# Patient Record
Sex: Female | Born: 1970 | Race: White | Hispanic: No | Marital: Married | Smoking: Never smoker
Health system: Southern US, Community
[De-identification: ages and names within clinical notes are randomized; demographics above are authoritative.]

## PROBLEM LIST (undated history)

## (undated) DIAGNOSIS — R000 Tachycardia, unspecified: Secondary | ICD-10-CM

## (undated) DIAGNOSIS — O24419 Gestational diabetes mellitus in pregnancy, unspecified control: Secondary | ICD-10-CM

## (undated) DIAGNOSIS — F32A Depression, unspecified: Secondary | ICD-10-CM

## (undated) DIAGNOSIS — I4711 Inappropriate sinus tachycardia, so stated: Secondary | ICD-10-CM

## (undated) DIAGNOSIS — I10 Essential (primary) hypertension: Secondary | ICD-10-CM

## (undated) DIAGNOSIS — B029 Zoster without complications: Secondary | ICD-10-CM

## (undated) DIAGNOSIS — K219 Gastro-esophageal reflux disease without esophagitis: Secondary | ICD-10-CM

## (undated) DIAGNOSIS — D649 Anemia, unspecified: Secondary | ICD-10-CM

## (undated) DIAGNOSIS — C44519 Basal cell carcinoma of skin of other part of trunk: Secondary | ICD-10-CM

## (undated) DIAGNOSIS — N390 Urinary tract infection, site not specified: Secondary | ICD-10-CM

## (undated) DIAGNOSIS — Z8619 Personal history of other infectious and parasitic diseases: Secondary | ICD-10-CM

## (undated) DIAGNOSIS — J310 Chronic rhinitis: Secondary | ICD-10-CM

## (undated) DIAGNOSIS — R51 Headache: Secondary | ICD-10-CM

## (undated) DIAGNOSIS — T4145XA Adverse effect of unspecified anesthetic, initial encounter: Secondary | ICD-10-CM

## (undated) DIAGNOSIS — K648 Other hemorrhoids: Secondary | ICD-10-CM

## (undated) DIAGNOSIS — I319 Disease of pericardium, unspecified: Secondary | ICD-10-CM

## (undated) DIAGNOSIS — T8859XA Other complications of anesthesia, initial encounter: Secondary | ICD-10-CM

## (undated) DIAGNOSIS — Z803 Family history of malignant neoplasm of breast: Secondary | ICD-10-CM

## (undated) DIAGNOSIS — Z800 Family history of malignant neoplasm of digestive organs: Secondary | ICD-10-CM

## (undated) DIAGNOSIS — G43909 Migraine, unspecified, not intractable, without status migrainosus: Secondary | ICD-10-CM

## (undated) DIAGNOSIS — F329 Major depressive disorder, single episode, unspecified: Secondary | ICD-10-CM

## (undated) DIAGNOSIS — R112 Nausea with vomiting, unspecified: Secondary | ICD-10-CM

## (undated) DIAGNOSIS — Z9889 Other specified postprocedural states: Secondary | ICD-10-CM

## (undated) DIAGNOSIS — N6009 Solitary cyst of unspecified breast: Secondary | ICD-10-CM

## (undated) HISTORY — DX: Zoster without complications: B02.9

## (undated) HISTORY — DX: Tachycardia, unspecified: R00.0

## (undated) HISTORY — PX: COLONOSCOPY: SHX174

## (undated) HISTORY — DX: Urinary tract infection, site not specified: N39.0

## (undated) HISTORY — DX: Headache: R51

## (undated) HISTORY — DX: Family history of malignant neoplasm of breast: Z80.3

## (undated) HISTORY — DX: Chronic rhinitis: J31.0

## (undated) HISTORY — DX: Migraine, unspecified, not intractable, without status migrainosus: G43.909

## (undated) HISTORY — DX: Family history of malignant neoplasm of digestive organs: Z80.0

## (undated) HISTORY — DX: Gastro-esophageal reflux disease without esophagitis: K21.9

## (undated) HISTORY — DX: Inappropriate sinus tachycardia, so stated: I47.11

## (undated) HISTORY — DX: Gestational diabetes mellitus in pregnancy, unspecified control: O24.419

## (undated) HISTORY — DX: Solitary cyst of unspecified breast: N60.09

## (undated) HISTORY — DX: Major depressive disorder, single episode, unspecified: F32.9

## (undated) HISTORY — DX: Anemia, unspecified: D64.9

## (undated) HISTORY — DX: Depression, unspecified: F32.A

## (undated) HISTORY — DX: Other hemorrhoids: K64.8

## (undated) HISTORY — DX: Personal history of other infectious and parasitic diseases: Z86.19

## (undated) HISTORY — DX: Disease of pericardium, unspecified: I31.9

## (undated) HISTORY — PX: NASAL SINUS SURGERY: SHX719

## (undated) HISTORY — DX: Basal cell carcinoma of skin of other part of trunk: C44.519

---
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
## 2004-10-18 ENCOUNTER — Ambulatory Visit: Payer: Self-pay

## 2004-11-21 DIAGNOSIS — K648 Other hemorrhoids: Secondary | ICD-10-CM

## 2004-11-21 HISTORY — DX: Other hemorrhoids: K64.8

## 2004-11-21 LAB — HM COLONOSCOPY

## 2004-12-24 ENCOUNTER — Ambulatory Visit: Payer: Self-pay | Admitting: Unknown Physician Specialty

## 2008-11-21 HISTORY — PX: CHOLECYSTECTOMY: SHX55

## 2009-03-05 ENCOUNTER — Ambulatory Visit (HOSPITAL_COMMUNITY): Admission: RE | Admit: 2009-03-05 | Discharge: 2009-03-05 | Payer: Self-pay | Admitting: Gastroenterology

## 2009-04-07 ENCOUNTER — Ambulatory Visit (HOSPITAL_COMMUNITY): Admission: RE | Admit: 2009-04-07 | Discharge: 2009-04-07 | Payer: Self-pay | Admitting: General Surgery

## 2009-04-07 ENCOUNTER — Encounter (INDEPENDENT_AMBULATORY_CARE_PROVIDER_SITE_OTHER): Payer: Self-pay | Admitting: General Surgery

## 2009-06-08 ENCOUNTER — Encounter: Admission: RE | Admit: 2009-06-08 | Discharge: 2009-06-08 | Payer: Self-pay | Admitting: Orthopedic Surgery

## 2009-06-25 ENCOUNTER — Encounter: Admission: RE | Admit: 2009-06-25 | Discharge: 2009-06-25 | Payer: Self-pay | Admitting: Orthopedic Surgery

## 2009-07-08 ENCOUNTER — Encounter: Admission: RE | Admit: 2009-07-08 | Discharge: 2009-07-08 | Payer: Self-pay | Admitting: Orthopedic Surgery

## 2009-11-21 DIAGNOSIS — N6009 Solitary cyst of unspecified breast: Secondary | ICD-10-CM

## 2009-11-21 HISTORY — PX: BASAL CELL CARCINOMA EXCISION: SHX1214

## 2009-11-21 HISTORY — DX: Solitary cyst of unspecified breast: N60.09

## 2010-12-12 ENCOUNTER — Encounter: Payer: Self-pay | Admitting: Gastroenterology

## 2011-03-01 LAB — HEMOGLOBIN AND HEMATOCRIT, BLOOD
HCT: 38.2 % (ref 36.0–46.0)
Hemoglobin: 12.8 g/dL (ref 12.0–15.0)

## 2011-03-01 LAB — PREGNANCY, URINE: Preg Test, Ur: NEGATIVE

## 2011-04-05 NOTE — Op Note (Signed)
Hailey Wilkins, Hailey Wilkins                ACCOUNT NO.:  [id number]   MEDICAL RECORD NO.:  [id number]          PATIENT TYPE:  AMB   LOCATION:  DAY                          FACILITY:  [facility]   PHYSICIAN:  Almond Lint, MD       DATE OF BIRTH:  02/28/1971   DATE OF PROCEDURE:  04/07/2009  DATE OF DISCHARGE:                               OPERATIVE REPORT   PREOPERATIVE DIAGNOSIS:  Biliary dyskinesia.   POSTOPERATIVE DIAGNOSIS:  Biliary dyskinesia.   PROCEDURE PERFORMED:  Laparoscopic cholecystectomy.   SURGEON:  Almond Lint, MD.   ASSISTANTLoreta Ave, RNFA.   ANESTHESIA:  General and local.   FINDINGS:  Small contracted gallbladder.   SPECIMEN:  Gallbladder to pathology.   ESTIMATED BLOOD LOSS:  Minimal.   COMPLICATIONS:  None known.   PROCEDURE:  Ms. Epler was identified in the holding area and taken to  the operating room where she was placed supine on the operating room  table.  General endotracheal anesthesia was induced.  The patient's  abdomen was prepped and draped in a sterile fashion.  A timeout was  performed according to the surgical safety check list.  When all was  correct, we continued.   The infraumbilical skin was anesthetized with a mixture of 1% lidocaine  plain and quarter-percent Marcaine with epinephrine.  A curvilinear  transverse incision was made under the umbilicus.  This was  approximately 1.5 cm in length.  The subcutaneous tissues were spread  with the Kelly clamp and the umbilical stock was elevated with a Market researcher.  A #11 blade was used to incise the midline fascia.  A Kelly clamp was  used to confirm entrance into the peritoneal cavity.  The Kochers were  manipulated to either side of the fascial incision.  A pursestring  suture was placed around the fascial incision with a 0 Vicryl.  The  [facility] trocar was introduced into the abdomen and pneumoperitoneum was  achieved to a pressure of 15 mmHg.  An #11 port was introduced into the  abdomen in the  epigastrium after administration of local anesthesia.  This was done under direct visualization and no injuries were seen.  The  two 5 mm ports were placed in the right upper quadrant after  administration of local under direct visualization.  A locking grasper  was placed on the fundus of the gallbladder and elevated toward the  head.  The infundibulum was retracted laterally.  A [place] dissector  was used to skeletonize the cystic artery and the cystic duct.  The  peritoneum was opened with the hook cautery in order to visualize the  edge of the liver.  A critical view was obtained from the medial and  lateral, ensuring that there were only two tubular structures directly  entering the gallbladder.  These were clipped.  The cystic duct was  clipped three times on the patient's side and once on the specimen side.  The cystic artery was clipped twice on the patient's side and once on  the specimen side.  These were transected.  Hook cautery was then used  to take the gallbladder off of the gallbladder fossa.  The camera was  then moved to the epigastric port and an EndoCatch bag was used to place  the gallbladder into the bag.  The gallbladder was removed through the  umbilical incision and the [facility] trocar was replaced.  The gallbladder  fossa was reexamined.  At one point, the gallbladder had been entered  with the cautery and there was a small amount of bile spillage.  There  were no stones visible.  The gallbladder fossa and the suprahepatic  region were irrigated copiously.  Once the irrigant returned clear and  the gallbladder fossa was reexamined, there was no evidence of bleeding.  The Roseanne Reno was then removed from the umbilicus and the pursestring  suture closed.  The fascial incision was palpated and there was no signs  of defect.  The skin was then closed using 4-0 Monocryl in a  subcuticular fashion.  The patient was awakened from anesthesia and  taken to the PACU in stable  condition.      Almond Lint, MD  Electronically Signed     FB/MEDQ  D:  04/07/2009  T:  04/07/2009  Job:  [id number]

## 2011-04-22 DIAGNOSIS — B029 Zoster without complications: Secondary | ICD-10-CM

## 2011-04-22 HISTORY — DX: Zoster without complications: B02.9

## 2011-11-21 LAB — HM PAP SMEAR

## 2011-12-23 LAB — HM MAMMOGRAPHY

## 2012-02-13 ENCOUNTER — Ambulatory Visit (INDEPENDENT_AMBULATORY_CARE_PROVIDER_SITE_OTHER): Payer: PRIVATE HEALTH INSURANCE | Admitting: Internal Medicine

## 2012-02-13 ENCOUNTER — Encounter: Payer: Self-pay | Admitting: Internal Medicine

## 2012-02-13 VITALS — BP 106/70 | HR 95 | Temp 98.8°F | Ht 62.0 in | Wt 179.0 lb

## 2012-02-13 DIAGNOSIS — J329 Chronic sinusitis, unspecified: Secondary | ICD-10-CM

## 2012-02-13 DIAGNOSIS — R400 Somnolence: Secondary | ICD-10-CM | POA: Insufficient documentation

## 2012-02-13 DIAGNOSIS — Z889 Allergy status to unspecified drugs, medicaments and biological substances status: Secondary | ICD-10-CM | POA: Insufficient documentation

## 2012-02-13 DIAGNOSIS — J45909 Unspecified asthma, uncomplicated: Secondary | ICD-10-CM

## 2012-02-13 DIAGNOSIS — Z888 Allergy status to other drugs, medicaments and biological substances status: Secondary | ICD-10-CM

## 2012-02-13 DIAGNOSIS — G4710 Hypersomnia, unspecified: Secondary | ICD-10-CM

## 2012-02-13 MED ORDER — PREDNISONE (PAK) 10 MG PO TABS: ORAL_TABLET | ORAL | Status: AC

## 2012-02-13 NOTE — Progress Notes (Signed)
Subjective:    Patient ID: Hailey Wilkins, female    DOB: May 21, 1971, 41 y.o.   MRN: [id number]  HPI  41YO female with h/o allergic rhinitis and asthma presents to establish care. She has several concerns today. First, she notes recent daytime somnolence. She has attributed this to use of celexa. She notes that she gets adequate sleep at night, but does snore. She has never been evaluated for sleep apnea.  She is also concerned about several weeks of nasal congestion.  She notes purulent nasal drainage which runs down the back of her throat. She notes bilateral forehead pain and ear pain.  She has been taking sudafed with no improvement.  She denies fever or chills. She denies cough or dyspnea.    Outpatient Encounter Prescriptions as of 02/13/2012  Medication Sig Dispense Refill  . albuterol (VENTOLIN HFA) 108 (90 BASE) MCG/ACT inhaler Inhale 2 puffs into the lungs every 6 (six) hours as needed.      Marland Kitchen aspirin-acetaminophen-caffeine (EXCEDRIN MIGRAINE) 250-250-65 MG per tablet Take 1 tablet by mouth every 6 (six) hours as needed.      Marland Kitchen b complex vitamins tablet Take 1 tablet by mouth daily.      . cetirizine (ZYRTEC) 10 MG tablet Take 10 mg by mouth daily.      . Cholecalciferol (VITAMIN D) 2000 UNITS tablet Take 2,000 Units by mouth daily.      . citalopram (CELEXA) 20 MG tablet Take 20 mg by mouth daily.      . valACYclovir (VALTREX) 1000 MG tablet Take 1,000 mg by mouth daily as needed. For fever blister      . predniSONE (STERAPRED UNI-PAK) 10 MG tablet Take 60mg  day 1 then taper by 10mg  daily  21 tablet  0    Review of Systems  Constitutional: Negative for fever, chills, appetite change, fatigue and unexpected weight change.  HENT: Positive for congestion, postnasal drip and sinus pressure. Negative for ear pain, sore throat, trouble swallowing, neck pain and voice change.   Eyes: Negative for visual disturbance.  Respiratory: Negative for cough, shortness of breath, wheezing and  stridor.   Cardiovascular: Negative for chest pain, palpitations and leg swelling.  Gastrointestinal: Negative for nausea, vomiting, abdominal pain, diarrhea, constipation, blood in stool, abdominal distention and anal bleeding.  Genitourinary: Negative for dysuria and flank pain.  Musculoskeletal: Negative for myalgias, arthralgias and gait problem.  Skin: Negative for color change and rash.  Neurological: Positive for headaches. Negative for dizziness.  Hematological: Negative for adenopathy. Does not bruise/bleed easily.  Psychiatric/Behavioral: Negative for suicidal ideas, sleep disturbance and dysphoric mood. The patient is not nervous/anxious.    BP 106/70  Pulse 95  Temp(Src) 98.8 F (37.1 C) (Oral)  Ht 5\' 2"  (1.575 m)  Wt 179 lb (81.194 kg)  BMI 32.74 kg/m2  SpO2 99%     Objective:   Physical Exam  Constitutional: She is oriented to person, place, and time. She appears well-developed and well-nourished. No distress.  HENT:  Head: Normocephalic and atraumatic.  Right Ear: External ear normal.  Left Ear: External ear normal.  Nose: Mucosal edema present. Right sinus exhibits frontal sinus tenderness. Left sinus exhibits frontal sinus tenderness.  Mouth/Throat: Oropharynx is clear and moist. No oropharyngeal exudate.  Eyes: Conjunctivae are normal. Pupils are equal, round, and reactive to light. Right eye exhibits no discharge. Left eye exhibits no discharge. No scleral icterus.  Neck: Normal range of motion. Neck supple. No tracheal deviation present. No thyromegaly present.  Cardiovascular: Normal rate, regular rhythm, normal heart sounds and intact distal pulses.  Exam reveals no gallop and no friction rub.   No murmur heard. Pulmonary/Chest: Effort normal and breath sounds normal. No respiratory distress. She has no wheezes. She has no rales. She exhibits no tenderness.  Abdominal: Soft. Bowel sounds are normal. She exhibits no distension. There is no tenderness.    Musculoskeletal: Normal range of motion. She exhibits no edema and no tenderness.  Lymphadenopathy:    She has no cervical adenopathy.  Neurological: She is alert and oriented to person, place, and time. No cranial nerve deficit. She exhibits normal muscle tone. Coordination normal.  Skin: Skin is warm and dry. No rash noted. She is not diaphoretic. No erythema. No pallor.  Psychiatric: She has a normal mood and affect. Her behavior is normal. Judgment and thought content normal.          Assessment & Plan:

## 2012-02-13 NOTE — Assessment & Plan Note (Signed)
Symptoms consistent with frontal sinusitis. However, pt has numerous antibiotic allergies.  Would likely need IV antibiotics based on current allergy list.  Will treat with course of prednisone to see if any improvement in inflammation/pain.  Will set up with [facility] for further evaluation. Question if she would benefit from direct visualization of sinuses and specific allergy testing to help determine if she has true allergy to classes of antibiotics including PCN, fluoroquinolone, and cephalosporins.  Follow up 2 months.

## 2012-02-13 NOTE — Assessment & Plan Note (Signed)
Symptoms suggestive of sleep apnea. Will get sleep study for further evaluation.

## 2012-02-13 NOTE — Assessment & Plan Note (Signed)
As above, will set up with [facility] to see if allergy testing can be performed to help determine if true allergies to antibiotic classes.

## 2012-02-15 ENCOUNTER — Encounter: Payer: Self-pay | Admitting: Internal Medicine

## 2012-02-15 DIAGNOSIS — Z889 Allergy status to unspecified drugs, medicaments and biological substances status: Secondary | ICD-10-CM

## 2012-02-28 ENCOUNTER — Encounter: Payer: Self-pay | Admitting: Internal Medicine

## 2012-03-29 ENCOUNTER — Encounter: Payer: Self-pay | Admitting: Internal Medicine

## 2012-04-03 ENCOUNTER — Institutional Professional Consult (permissible substitution): Payer: PRIVATE HEALTH INSURANCE | Admitting: Pulmonary Disease

## 2012-04-05 DIAGNOSIS — L209 Atopic dermatitis, unspecified: Secondary | ICD-10-CM | POA: Insufficient documentation

## 2012-04-05 DIAGNOSIS — Z91013 Allergy to seafood: Secondary | ICD-10-CM | POA: Insufficient documentation

## 2012-04-05 DIAGNOSIS — J329 Chronic sinusitis, unspecified: Secondary | ICD-10-CM | POA: Insufficient documentation

## 2012-04-23 ENCOUNTER — Encounter: Payer: Self-pay | Admitting: Internal Medicine

## 2012-04-23 ENCOUNTER — Ambulatory Visit (INDEPENDENT_AMBULATORY_CARE_PROVIDER_SITE_OTHER): Payer: PRIVATE HEALTH INSURANCE | Admitting: Internal Medicine

## 2012-04-23 VITALS — BP 110/70 | HR 93 | Temp 98.2°F | Ht 62.0 in | Wt 174.0 lb

## 2012-04-23 DIAGNOSIS — N951 Menopausal and female climacteric states: Secondary | ICD-10-CM

## 2012-04-23 DIAGNOSIS — R232 Flushing: Secondary | ICD-10-CM | POA: Insufficient documentation

## 2012-04-23 LAB — FOLLICLE STIMULATING HORMONE: FSH: 15.2 m[IU]/mL

## 2012-04-23 LAB — COMPREHENSIVE METABOLIC PANEL
ALT: 15 U/L (ref 0–35)
AST: 30 U/L (ref 0–37)
Albumin: 4 g/dL (ref 3.5–5.2)
Alkaline Phosphatase: 72 U/L (ref 39–117)
BUN: 9 mg/dL (ref 6–23)
CO2: 25 mEq/L (ref 19–32)
Calcium: 9.1 mg/dL (ref 8.4–10.5)
Chloride: 106 mEq/L (ref 96–112)
Creatinine, Ser: 0.9 mg/dL (ref 0.4–1.2)
GFR: 73.84 mL/min (ref >60.00)
Glucose, Bld: 78 mg/dL (ref 70–99)
Potassium: 4.6 mEq/L (ref 3.5–5.1)
Sodium: 142 mEq/L (ref 135–145)
Total Bilirubin: 0.5 mg/dL (ref 0.3–1.2)
Total Protein: 7.2 g/dL (ref 6.0–8.3)

## 2012-04-23 LAB — TSH: TSH: 1.71 u[IU]/mL (ref 0.35–5.50)

## 2012-04-23 LAB — LUTEINIZING HORMONE: LH: 4.49 m[IU]/mL

## 2012-04-23 NOTE — Assessment & Plan Note (Signed)
Symptoms most consistent with menopause. Will check LH and FSH with labs. Will also check TSH.

## 2012-04-23 NOTE — Progress Notes (Signed)
Subjective:    Patient ID: Hailey Wilkins, female    DOB: 1971-04-15, 41 y.o.   MRN: [id number]  HPI 41YO female with h/o allergies presents for follow up. Only concern today is intermittent hot flashes. Hot flashes occur several times through day and night. Disturb sleep.  Also notes some tearfulness. Fatigue has improved with going to gym and eating healthy. Pt does not have menses because of IUD.  Outpatient Encounter Prescriptions as of 04/23/2012  Medication Sig Dispense Refill  . albuterol (VENTOLIN HFA) 108 (90 BASE) MCG/ACT inhaler Inhale 2 puffs into the lungs every 6 (six) hours as needed.      Marland Kitchen aspirin-acetaminophen-caffeine (EXCEDRIN MIGRAINE) 250-250-65 MG per tablet Take 1 tablet by mouth every 6 (six) hours as needed.      Marland Kitchen b complex vitamins tablet Take 1 tablet by mouth daily.      . cetirizine (ZYRTEC) 10 MG tablet Take 10 mg by mouth daily.      . Cholecalciferol (VITAMIN D) 2000 UNITS tablet Take 2,000 Units by mouth daily.      . citalopram (CELEXA) 20 MG tablet Take 20 mg by mouth daily.      . fluticasone (FLONASE) 50 MCG/ACT nasal spray Place 2 sprays into the nose 2 (two) times daily.      . valACYclovir (VALTREX) 1000 MG tablet Take 1,000 mg by mouth daily as needed. For fever blister       BP 110/70  Pulse 93  Temp(Src) 98.2 F (36.8 C) (Oral)  Ht 5\' 2"  (1.575 m)  Wt 174 lb (78.926 kg)  BMI 31.83 kg/m2  Review of Systems  Constitutional: Positive for diaphoresis. Negative for fever, chills, appetite change, fatigue and unexpected weight change.  HENT: Negative for ear pain, congestion, sore throat, trouble swallowing, neck pain, voice change and sinus pressure.   Eyes: Negative for visual disturbance.  Respiratory: Negative for cough and shortness of breath.   Cardiovascular: Negative for chest pain, palpitations and leg swelling.  Gastrointestinal: Negative for nausea, vomiting, abdominal pain, diarrhea, constipation, blood in stool, abdominal distention and  anal bleeding.  Genitourinary: Negative for dysuria and flank pain.  Musculoskeletal: Negative for myalgias, arthralgias and gait problem.  Skin: Negative for color change and rash.  Neurological: Negative for dizziness and headaches.  Hematological: Negative for adenopathy. Does not bruise/bleed easily.  Psychiatric/Behavioral: Negative for suicidal ideas, sleep disturbance and dysphoric mood. The patient is not nervous/anxious.        Objective:   Physical Exam  Constitutional: She is oriented to person, place, and time. She appears well-developed and well-nourished. No distress.  HENT:  Head: Normocephalic and atraumatic.  Right Ear: External ear normal.  Left Ear: External ear normal.  Nose: Nose normal.  Mouth/Throat: Oropharynx is clear and moist. No oropharyngeal exudate.  Eyes: Conjunctivae are normal. Pupils are equal, round, and reactive to light. Right eye exhibits no discharge. Left eye exhibits no discharge. No scleral icterus.  Neck: Normal range of motion. Neck supple. No tracheal deviation present. No thyromegaly present.  Cardiovascular: Normal rate, regular rhythm, normal heart sounds and intact distal pulses.  Exam reveals no gallop and no friction rub.   No murmur heard. Pulmonary/Chest: Effort normal and breath sounds normal. No respiratory distress. She has no wheezes. She has no rales. She exhibits no tenderness.  Musculoskeletal: Normal range of motion. She exhibits no edema and no tenderness.  Lymphadenopathy:    She has no cervical adenopathy.  Neurological: She is alert and oriented  to person, place, and time. No cranial nerve deficit. She exhibits normal muscle tone. Coordination normal.  Skin: Skin is warm and dry. No rash noted. She is not diaphoretic. No erythema. No pallor.  Psychiatric: She has a normal mood and affect. Her behavior is normal. Judgment and thought content normal.          Assessment & Plan:

## 2012-05-28 ENCOUNTER — Other Ambulatory Visit: Payer: Self-pay | Admitting: Internal Medicine

## 2012-05-28 MED ORDER — PREDNISONE (PAK) 10 MG PO TABS: ORAL_TABLET | ORAL | Status: AC

## 2012-05-28 NOTE — Telephone Encounter (Signed)
Patient is having her really bad headaches again. Can she get a steriod pack called in?

## 2012-05-28 NOTE — Telephone Encounter (Signed)
Rx for Prednisone taper called in. If symptoms not improving over next 24hr, needs to be seen.

## 2012-05-28 NOTE — Telephone Encounter (Signed)
Patient advised as instructed via telephone. 

## 2012-05-30 ENCOUNTER — Telehealth: Payer: Self-pay | Admitting: Internal Medicine

## 2012-05-30 NOTE — Telephone Encounter (Signed)
She needs to be seen.

## 2012-05-30 NOTE — Telephone Encounter (Signed)
Spoke with patient and she stated that she is still having sinus pain/pressure, teeth pain, and pain between her eyes.  She has some drainage but she doesn't feel that the Prednisone is doing anything for her.  She stated that she is feeling a throbbing pain in her sinus area this morning.  Please advise.

## 2012-05-30 NOTE — Telephone Encounter (Signed)
Called patient at work and left a voicemail for patient to return call, scheduled her to see Dr. Dan Humphreys tomorrow at 2:15.

## 2012-05-30 NOTE — Telephone Encounter (Signed)
The medication she was given is not making it better.

## 2012-05-31 ENCOUNTER — Ambulatory Visit (INDEPENDENT_AMBULATORY_CARE_PROVIDER_SITE_OTHER): Payer: PRIVATE HEALTH INSURANCE | Admitting: Internal Medicine

## 2012-05-31 ENCOUNTER — Encounter: Payer: Self-pay | Admitting: Internal Medicine

## 2012-05-31 VITALS — BP 120/82 | HR 80 | Temp 98.8°F | Ht 62.0 in | Wt 171.8 lb

## 2012-05-31 DIAGNOSIS — G43909 Migraine, unspecified, not intractable, without status migrainosus: Secondary | ICD-10-CM | POA: Insufficient documentation

## 2012-05-31 MED ORDER — PROMETHAZINE HCL 25 MG/ML IJ SOLN
12.5000 mg | Freq: Once | INTRAMUSCULAR | Status: AC
  Administered 2012-05-31: 12.5 mg via INTRAMUSCULAR

## 2012-05-31 MED ORDER — NORTRIPTYLINE HCL 25 MG PO CAPS: 25.0000 mg | ORAL_CAPSULE | Freq: Every day | ORAL | Status: DC

## 2012-05-31 MED ORDER — KETOROLAC TROMETHAMINE 60 MG/2ML IM SOLN
30.0000 mg | Freq: Once | INTRAMUSCULAR | Status: AC
  Administered 2012-05-31: 30 mg via INTRAMUSCULAR

## 2012-05-31 NOTE — Progress Notes (Signed)
Subjective:    Patient ID: Hailey Wilkins, female    DOB: July 11, 1971, 41 y.o.   MRN: [id number]  HPI 41 year old female with history of migraine headaches presents for acute visit complaining of one-week history of bilateral headache pain. She reports that pain is consistent with previous migraines. She took Excedrin Migraine with no improvement. She was then started on prednisone taper to try to improve symptoms. However, she is 4 days and her prednisone taper with no improvement. She reports that headache has been continuous. It is associated with nausea. It is also associated with photophobia. She notes that this is her third migraine since having her Mirena IUD implanted.  Outpatient Encounter Prescriptions as of 05/31/2012  Medication Sig Dispense Refill  . albuterol (VENTOLIN HFA) 108 (90 BASE) MCG/ACT inhaler Inhale 2 puffs into the lungs every 6 (six) hours as needed.      Marland Kitchen aspirin-acetaminophen-caffeine (EXCEDRIN MIGRAINE) 250-250-65 MG per tablet Take 1 tablet by mouth every 6 (six) hours as needed.      Marland Kitchen b complex vitamins tablet Take 1 tablet by mouth daily.      . cetirizine (ZYRTEC) 10 MG tablet Take 10 mg by mouth daily.      . Cholecalciferol (VITAMIN D) 2000 UNITS tablet Take 2,000 Units by mouth daily.      . citalopram (CELEXA) 20 MG tablet Take 20 mg by mouth daily.      . fluticasone (FLONASE) 50 MCG/ACT nasal spray Place 2 sprays into the nose 2 (two) times daily.      . predniSONE (STERAPRED UNI-PAK) 10 MG tablet Take 60mg  day 1 then taper by 10mg  daily  21 tablet  0  . valACYclovir (VALTREX) 1000 MG tablet Take 1,000 mg by mouth daily as needed. For fever blister      . nortriptyline (PAMELOR) 25 MG capsule Take 1 capsule (25 mg total) by mouth at bedtime.  30 capsule  3   Facility-Administered Encounter Medications as of 05/31/2012  Medication Dose Route Frequency Provider Last Rate Last Dose  . ketorolac (TORADOL) injection 30 mg  30 mg Intramuscular Once Shelia Media, MD   30 mg at 05/31/12 1457  . promethazine (PHENERGAN) injection 12.5 mg  12.5 mg Intramuscular Once Shelia Media, MD   12.5 mg at 05/31/12 1458   BP 120/82  Pulse 80  Temp 98.8 F (37.1 C) (Oral)  Ht 5\' 2"  (1.575 m)  Wt 171 lb 12 oz (77.905 kg)  BMI 31.41 kg/m2  SpO2 98%  Review of Systems  Constitutional: Negative for fever, chills, appetite change, fatigue and unexpected weight change.  HENT: Positive for sinus pressure.   Eyes: Positive for photophobia. Negative for visual disturbance.  Respiratory: Negative for cough, shortness of breath, wheezing and stridor.   Cardiovascular: Negative for chest pain, palpitations and leg swelling.  Gastrointestinal: Positive for nausea. Negative for abdominal pain.  Genitourinary: Negative for dysuria and flank pain.  Musculoskeletal: Negative for myalgias, arthralgias and gait problem.  Skin: Negative for color change and rash.  Neurological: Positive for headaches. Negative for dizziness, tremors, seizures, syncope and weakness.  Hematological: Negative for adenopathy. Does not bruise/bleed easily.  Psychiatric/Behavioral: Negative for suicidal ideas, disturbed wake/sleep cycle and dysphoric mood. The patient is not nervous/anxious.        Objective:   Physical Exam  Constitutional: She is oriented to person, place, and time. She appears well-developed and well-nourished. No distress.  HENT:  Head: Normocephalic and atraumatic.  Right Ear: External  ear normal.  Left Ear: External ear normal.  Nose: Nose normal.  Mouth/Throat: Oropharynx is clear and moist. No oropharyngeal exudate.  Eyes: Conjunctivae are normal. Pupils are equal, round, and reactive to light. Right eye exhibits no discharge. Left eye exhibits no discharge. No scleral icterus.  Neck: Normal range of motion. Neck supple. No tracheal deviation present. No thyromegaly present.  Cardiovascular: Normal rate, regular rhythm, normal heart sounds and intact  distal pulses.  Exam reveals no gallop and no friction rub.   No murmur heard. Pulmonary/Chest: Effort normal and breath sounds normal. No respiratory distress. She has no wheezes. She has no rales. She exhibits no tenderness.  Musculoskeletal: Normal range of motion. She exhibits no edema and no tenderness.  Lymphadenopathy:    She has no cervical adenopathy.  Neurological: She is alert and oriented to person, place, and time. No cranial nerve deficit. She exhibits normal muscle tone. Coordination normal.  Skin: Skin is warm and dry. No rash noted. She is not diaphoretic. No erythema. No pallor.  Psychiatric: She has a normal mood and affect. Her behavior is normal. Judgment and thought content normal.          Assessment & Plan:

## 2012-05-31 NOTE — Assessment & Plan Note (Signed)
Severe migraine, not responsive to ibuprofen/acetaminophen, steroid taper. This is third severe migraine since having Mirena IUD placed, so recommended that she discuss this with OB, as may benefit from another form of birth control. Will give toradol and phenergan today to try to break headache.  Pt will also start Pamelor tonight as preventative med. She will email with update tomorrow. Follow up 1 month and prn.

## 2012-06-01 ENCOUNTER — Encounter: Payer: Self-pay | Admitting: Internal Medicine

## 2012-06-04 ENCOUNTER — Encounter: Payer: Self-pay | Admitting: Internal Medicine

## 2012-06-22 ENCOUNTER — Other Ambulatory Visit: Payer: Self-pay | Admitting: Internal Medicine

## 2012-06-22 DIAGNOSIS — R400 Somnolence: Secondary | ICD-10-CM

## 2012-09-27 ENCOUNTER — Encounter: Payer: Self-pay | Admitting: Internal Medicine

## 2012-09-27 DIAGNOSIS — G43909 Migraine, unspecified, not intractable, without status migrainosus: Secondary | ICD-10-CM

## 2012-09-27 MED ORDER — NORTRIPTYLINE HCL 25 MG PO CAPS: 25.0000 mg | ORAL_CAPSULE | Freq: Every day | ORAL | Status: DC

## 2012-10-12 ENCOUNTER — Ambulatory Visit: Payer: PRIVATE HEALTH INSURANCE | Admitting: Internal Medicine

## 2012-10-21 DIAGNOSIS — I319 Disease of pericardium, unspecified: Secondary | ICD-10-CM

## 2012-10-21 HISTORY — DX: Disease of pericardium, unspecified: I31.9

## 2012-10-22 ENCOUNTER — Ambulatory Visit (INDEPENDENT_AMBULATORY_CARE_PROVIDER_SITE_OTHER): Payer: PRIVATE HEALTH INSURANCE | Admitting: Internal Medicine

## 2012-10-22 ENCOUNTER — Encounter: Payer: Self-pay | Admitting: Internal Medicine

## 2012-10-22 VITALS — BP 124/80 | HR 90 | Temp 98.7°F | Resp 16 | Wt 169.8 lb

## 2012-10-22 DIAGNOSIS — G43909 Migraine, unspecified, not intractable, without status migrainosus: Secondary | ICD-10-CM

## 2012-10-22 DIAGNOSIS — Z1331 Encounter for screening for depression: Secondary | ICD-10-CM

## 2012-10-22 MED ORDER — NORTRIPTYLINE HCL 25 MG PO CAPS: 25.0000 mg | ORAL_CAPSULE | Freq: Every day | ORAL | Status: DC

## 2012-10-22 NOTE — Assessment & Plan Note (Signed)
Migraine headaches well controlled with use of Pamelor. Will continue. Discussed potentially adding medication such as Maxalt for acute treatment of headache, but will hold off for now. Followup in 6 months or sooner as needed

## 2012-10-22 NOTE — Progress Notes (Signed)
Subjective:    Patient ID: Hailey Wilkins, female    DOB: 1971-05-26, 41 y.o.   MRN: [id number]  HPI 41 year old female with history of migraine headaches and allergic rhinitis presents for followup. At her last visit, we started her on Pamelor to better control headache symptoms. She reports that since her last visit she has had 3 episodes of headache. The been controlled with the use of Excedrin Migraine. They're described as diffuse aching with photophobia. She denies any side effects from the use of Pamelor. She reports she is generally feeling well.  Outpatient Encounter Prescriptions as of 10/22/2012  Medication Sig Dispense Refill  . albuterol (VENTOLIN HFA) 108 (90 BASE) MCG/ACT inhaler Inhale 2 puffs into the lungs every 6 (six) hours as needed.      Marland Kitchen aspirin-acetaminophen-caffeine (EXCEDRIN MIGRAINE) 250-250-65 MG per tablet Take 1 tablet by mouth every 6 (six) hours as needed.      Marland Kitchen b complex vitamins tablet Take 1 tablet by mouth daily.      . cetirizine (ZYRTEC) 10 MG tablet Take 10 mg by mouth daily.      . Cholecalciferol (VITAMIN D) 2000 UNITS tablet Take 2,000 Units by mouth daily.      . fluticasone (FLONASE) 50 MCG/ACT nasal spray Place 2 sprays into the nose 2 (two) times daily.      . nortriptyline (PAMELOR) 25 MG capsule Take 1 capsule (25 mg total) by mouth at bedtime.  30 capsule  6  . valACYclovir (VALTREX) 1000 MG tablet Take 1,000 mg by mouth daily as needed. For fever blister       BP 124/80  Pulse 90  Temp 98.7 F (37.1 C) (Oral)  Resp 16  Wt 169 lb 12 oz (76.998 kg)  Review of Systems  Constitutional: Negative for fever, chills, appetite change, fatigue and unexpected weight change.  HENT: Negative for ear pain, congestion, sore throat, trouble swallowing, neck pain, voice change and sinus pressure.   Eyes: Negative for visual disturbance.  Respiratory: Negative for cough, shortness of breath, wheezing and stridor.   Cardiovascular: Negative for chest pain,  palpitations and leg swelling.  Gastrointestinal: Negative for nausea, vomiting, abdominal pain, diarrhea, constipation, blood in stool, abdominal distention and anal bleeding.  Genitourinary: Negative for dysuria and flank pain.  Musculoskeletal: Negative for myalgias, arthralgias and gait problem.  Skin: Negative for color change and rash.  Neurological: Positive for headaches. Negative for dizziness.  Hematological: Negative for adenopathy. Does not bruise/bleed easily.  Psychiatric/Behavioral: Negative for suicidal ideas, sleep disturbance and dysphoric mood. The patient is not nervous/anxious.        Objective:   Physical Exam  Constitutional: She is oriented to person, place, and time. She appears well-developed and well-nourished. No distress.  HENT:  Head: Normocephalic and atraumatic.  Right Ear: External ear normal.  Left Ear: External ear normal.  Nose: Nose normal.  Mouth/Throat: Oropharynx is clear and moist. No oropharyngeal exudate.  Eyes: Conjunctivae normal are normal. Pupils are equal, round, and reactive to light. Right eye exhibits no discharge. Left eye exhibits no discharge. No scleral icterus.  Neck: Normal range of motion. Neck supple. No tracheal deviation present. No thyromegaly present.  Cardiovascular: Normal rate, regular rhythm, normal heart sounds and intact distal pulses.  Exam reveals no gallop and no friction rub.   No murmur heard. Pulmonary/Chest: Effort normal and breath sounds normal. No respiratory distress. She has no wheezes. She has no rales. She exhibits no tenderness.  Musculoskeletal: Normal range of  motion. She exhibits no edema and no tenderness.  Lymphadenopathy:    She has no cervical adenopathy.  Neurological: She is alert and oriented to person, place, and time. No cranial nerve deficit. She exhibits normal muscle tone. Coordination normal.  Skin: Skin is warm and dry. No rash noted. She is not diaphoretic. No erythema. No pallor.    Psychiatric: She has a normal mood and affect. Her behavior is normal. Judgment and thought content normal.          Assessment & Plan:

## 2012-10-23 LAB — TSH: TSH: 2.1 u[IU]/mL (ref 0.35–5.50)

## 2012-10-23 LAB — COMPREHENSIVE METABOLIC PANEL
ALT: 25 U/L (ref 0–35)
AST: 31 U/L (ref 0–37)
Albumin: 4.6 g/dL (ref 3.5–5.2)
Alkaline Phosphatase: 79 U/L (ref 39–117)
BUN: 11 mg/dL (ref 6–23)
CO2: 28 mEq/L (ref 19–32)
Calcium: 9.7 mg/dL (ref 8.4–10.5)
Chloride: 100 mEq/L (ref 96–112)
Creatinine, Ser: 0.8 mg/dL (ref 0.4–1.2)
GFR: 82.12 mL/min (ref >60.00)
Glucose, Bld: 98 mg/dL (ref 70–99)
Potassium: 4.5 mEq/L (ref 3.5–5.1)
Sodium: 136 mEq/L (ref 135–145)
Total Bilirubin: 0.3 mg/dL (ref 0.3–1.2)
Total Protein: 8 g/dL (ref 6.0–8.3)

## 2012-11-01 ENCOUNTER — Encounter: Payer: Self-pay | Admitting: Internal Medicine

## 2012-11-06 ENCOUNTER — Telehealth: Payer: Self-pay | Admitting: Internal Medicine

## 2012-11-06 NOTE — Telephone Encounter (Signed)
Spoke to the pt.  Advised her to call Dr. Sonny Dandy office in the morning.  Pt stated she lives too far away to be seen here at [place].

## 2012-11-06 NOTE — Telephone Encounter (Signed)
Not a [place] pt- she sees dr walker?

## 2012-11-06 NOTE — Telephone Encounter (Signed)
Patient Information:  Caller Name: Tasfia  Phone: [phone]  Patient: Hailey Wilkins, Hailey Wilkins  Gender: Female  DOB: 10/16/1971  Age: 41 Years  PCP: Ronna Polio (Adults only)  Pregnant: No  Office Follow Up:  Does the office need to follow up with this patient?: Yes  Instructions For The Office: Pt would like an appt tomorrow.   Symptoms  Reason For Call & Symptoms: Pt is calling about having chest tightness. Pt has asthma. Her chest has been tight since yesterday. Pt has been using her Ventolin HFA q4h. No cough . No fever. Pt often presents like this and then needs steroids.   Reviewed Health History In EMR: Yes  Reviewed Medications In EMR: Yes  Reviewed Allergies In EMR: Yes  Reviewed Surgeries / Procedures: Yes  Date of Onset of Symptoms: 11/05/2012  Treatments Tried: Albuterol Q4h  Treatments Tried Worked: No OB:  LMP: Unknown  Guideline(s) Used:  Asthma Attack  Disposition Per Guideline:   Go to ED Now (or to Office with PCP Approval)  Reason For Disposition Reached:   Chest pain  Advice Given:  N/A  Patient Refused Recommendation:  Patient Refused Appt, Patient Requests Appt At Later Date  Pt refuses UC/ED. She would prefer to be see in the office. Office appts are full tomorrow. Pt asking office to call her back with a work/in if possible. RN advised this would not be tonight but in the am. ENcouraged pt to go to UC/ED tonight

## 2012-11-06 NOTE — Telephone Encounter (Signed)
Pt. Called and stated that she has had bad asthma past 24 hours without relief from inhaler. She was sent to triage nurse and was told by her that there no appointments available. She did not want to go to urgent care because she is allergic to all kinds of medicine and only Dr. Dan Humphreys would know what to do. If she gets worse overnight she will go to ER.

## 2012-11-07 ENCOUNTER — Ambulatory Visit (INDEPENDENT_AMBULATORY_CARE_PROVIDER_SITE_OTHER): Payer: PRIVATE HEALTH INSURANCE | Admitting: Internal Medicine

## 2012-11-07 ENCOUNTER — Encounter: Payer: Self-pay | Admitting: Internal Medicine

## 2012-11-07 VITALS — BP 112/80 | HR 90 | Temp 98.2°F | Resp 16 | Wt 168.5 lb

## 2012-11-07 DIAGNOSIS — J45901 Unspecified asthma with (acute) exacerbation: Secondary | ICD-10-CM

## 2012-11-07 MED ORDER — SODIUM CHLORIDE 0.9 % IV SOLN
125.0000 mg | Freq: Once | INTRAVENOUS | Status: AC
  Administered 2012-11-07: 130 mg via INTRAMUSCULAR

## 2012-11-07 MED ORDER — BENZONATATE 200 MG PO CAPS: 200.0000 mg | ORAL_CAPSULE | Freq: Two times a day (BID) | ORAL | Status: DC | PRN

## 2012-11-07 MED ORDER — PREDNISONE (PAK) 10 MG PO TABS: ORAL_TABLET | ORAL | Status: DC

## 2012-11-07 NOTE — Telephone Encounter (Signed)
Can you touch base with her and see how she is doing? If no urgent visits here, then maybe at [facility]?

## 2012-11-07 NOTE — Telephone Encounter (Signed)
Scheduled

## 2012-11-07 NOTE — Progress Notes (Signed)
Subjective:    Patient ID: Hailey Wilkins, female    DOB: June 05, 1971, 41 y.o.   MRN: [id number]  HPI 41 year old female presents for acute visit complaining of two-day history of increasing shortness of breath, wheezing, and chest tightness. She also describes some heaviness, "like an elephant sitting on her chest", which occurs during periods of shortness of breath. She has been using her albuterol inhaler 4 times daily with some improvement. She denies any fever, chills.  She did have some nasal congestion earlier this week which seems to have improved. She has several sick contacts at work.  Outpatient Encounter Prescriptions as of 11/07/2012  Medication Sig Dispense Refill  . albuterol (VENTOLIN HFA) 108 (90 BASE) MCG/ACT inhaler Inhale 2 puffs into the lungs every 6 (six) hours as needed.      Marland Kitchen aspirin-acetaminophen-caffeine (EXCEDRIN MIGRAINE) 250-250-65 MG per tablet Take 1 tablet by mouth every 6 (six) hours as needed.      Marland Kitchen b complex vitamins tablet Take 1 tablet by mouth daily.      . cetirizine (ZYRTEC) 10 MG tablet Take 10 mg by mouth daily.      . Cholecalciferol (VITAMIN D) 2000 UNITS tablet Take 2,000 Units by mouth daily.      . fluticasone (FLONASE) 50 MCG/ACT nasal spray Place 2 sprays into the nose 2 (two) times daily.      . nortriptyline (PAMELOR) 25 MG capsule Take 1 capsule (25 mg total) by mouth at bedtime.  30 capsule  6  . valACYclovir (VALTREX) 1000 MG tablet Take 1,000 mg by mouth daily as needed. For fever blister      . benzonatate (TESSALON) 200 MG capsule Take 1 capsule (200 mg total) by mouth 2 (two) times daily as needed for cough.  20 capsule  0  . predniSONE (STERAPRED UNI-PAK) 10 MG tablet Take 60mg  day 1 then taper by 10mg  daily  21 tablet  0  . [EXPIRED] methylPREDNISolone sodium succinate (SOLU-MEDROL) 130 mg in sodium chloride 0.9 % 50 mL IVPB        BP 112/80  Pulse 90  Temp 98.2 F (36.8 C) (Oral)  Resp 16  Wt 168 lb 8 oz (76.431 kg)  SpO2  97%  Review of Systems  Constitutional: Positive for fatigue. Negative for fever, chills and unexpected weight change.  HENT: Positive for rhinorrhea and postnasal drip. Negative for hearing loss, ear pain, nosebleeds, congestion, sore throat, facial swelling, sneezing, mouth sores, trouble swallowing, neck pain, neck stiffness, voice change, sinus pressure, tinnitus and ear discharge.   Eyes: Negative for pain, discharge, redness and visual disturbance.  Respiratory: Positive for cough, chest tightness, shortness of breath and wheezing. Negative for stridor.   Cardiovascular: Positive for chest pain. Negative for palpitations and leg swelling.  Musculoskeletal: Negative for myalgias and arthralgias.  Skin: Negative for color change and rash.  Neurological: Negative for dizziness, weakness, light-headedness and headaches.  Hematological: Negative for adenopathy.       Objective:   Physical Exam  Constitutional: She is oriented to person, place, and time. She appears well-developed and well-nourished. No distress.  HENT:  Head: Normocephalic and atraumatic.  Right Ear: External ear normal.  Left Ear: External ear normal.  Nose: Nose normal.  Mouth/Throat: Oropharynx is clear and moist. No oropharyngeal exudate.  Eyes: Conjunctivae normal are normal. Pupils are equal, round, and reactive to light. Right eye exhibits no discharge. Left eye exhibits no discharge. No scleral icterus.  Neck: Normal range of motion. Neck supple.  No tracheal deviation present. No thyromegaly present.  Cardiovascular: Normal rate, regular rhythm, normal heart sounds and intact distal pulses.  Exam reveals no gallop and no friction rub.   No murmur heard. Pulmonary/Chest: Effort normal. No accessory muscle usage. Not tachypneic. No respiratory distress. She has decreased breath sounds (prolonged expiration). She has wheezes (few). She has no rhonchi. She has no rales. She exhibits no tenderness.  Musculoskeletal:  Normal range of motion. She exhibits no edema and no tenderness.  Lymphadenopathy:    She has no cervical adenopathy.  Neurological: She is alert and oriented to person, place, and time. No cranial nerve deficit. She exhibits normal muscle tone. Coordination normal.  Skin: Skin is warm and dry. No rash noted. She is not diaphoretic. No erythema. No pallor.  Psychiatric: She has a normal mood and affect. Her behavior is normal. Judgment and thought content normal.          Assessment & Plan:

## 2012-11-07 NOTE — Telephone Encounter (Signed)
We can add her at 4:30 today.

## 2012-11-07 NOTE — Telephone Encounter (Signed)
Pt called stating that she is using her rescue inhaler every 4 hours. Chest does not feel as heavy as yesterday but she states she cannot fix this on her own anymore needs something extra. Pt states you normally send in a steroid to help her out.  Call back at [phone].

## 2012-11-07 NOTE — Assessment & Plan Note (Addendum)
Symptoms are most consistent with asthma exacerbation, most likely secondary to viral upper respiratory infection. Will treat with Solu-Medrol injection followed by a prednisone taper. Will continue to use albuterol inhaled every 4 hours as needed for shortness of breath or wheezing. Given her multiple drug allergies, will avoid addition of antibiotic at this time. If she has any recurrent fever or worsening symptoms of shortness of breath, chest pain, wheezing she will call or return to clinic. If symptoms are worsening, would favor chest x-ray and likely admission for IV steroids and continuous nebs.

## 2012-11-07 NOTE — Telephone Encounter (Signed)
Please tell pt, and add her to the schedule. Thanks!

## 2012-11-07 NOTE — Patient Instructions (Signed)
Email with update either Thursday or Friday.

## 2012-11-08 ENCOUNTER — Encounter: Payer: Self-pay | Admitting: Internal Medicine

## 2012-11-09 ENCOUNTER — Ambulatory Visit (INDEPENDENT_AMBULATORY_CARE_PROVIDER_SITE_OTHER)
Admission: RE | Admit: 2012-11-09 | Discharge: 2012-11-09 | Disposition: A | Discharge status: Home / Self Care | Payer: PRIVATE HEALTH INSURANCE | Source: Ambulatory Visit | Attending: Internal Medicine | Admitting: Internal Medicine

## 2012-11-09 ENCOUNTER — Encounter: Payer: Self-pay | Admitting: Internal Medicine

## 2012-11-09 ENCOUNTER — Emergency Department: Payer: Self-pay | Admitting: Emergency Medicine

## 2012-11-09 ENCOUNTER — Encounter: Payer: Self-pay | Admitting: Adult Health

## 2012-11-09 ENCOUNTER — Other Ambulatory Visit: Payer: Self-pay

## 2012-11-09 ENCOUNTER — Ambulatory Visit (INDEPENDENT_AMBULATORY_CARE_PROVIDER_SITE_OTHER): Payer: PRIVATE HEALTH INSURANCE | Admitting: Adult Health

## 2012-11-09 ENCOUNTER — Telehealth: Payer: Self-pay | Admitting: Adult Health

## 2012-11-09 VITALS — BP 131/74 | HR 101 | Temp 98.4°F | Ht 65.0 in | Wt 166.0 lb

## 2012-11-09 DIAGNOSIS — R079 Chest pain, unspecified: Secondary | ICD-10-CM

## 2012-11-09 DIAGNOSIS — J45909 Unspecified asthma, uncomplicated: Secondary | ICD-10-CM

## 2012-11-09 DIAGNOSIS — R071 Chest pain on breathing: Secondary | ICD-10-CM | POA: Insufficient documentation

## 2012-11-09 LAB — BASIC METABOLIC PANEL
Anion Gap: 10 (ref 7–16)
BUN: 13 mg/dL (ref 7–18)
Calcium, Total: 9 mg/dL (ref 8.5–10.1)
Chloride: 106 mmol/L (ref 98–107)
Co2: 27 mmol/L (ref 21–32)
Creatinine: 0.82 mg/dL (ref 0.60–1.30)
EGFR (African American): >60
EGFR (Non-African Amer.): >60
Glucose: 106 mg/dL — ABNORMAL HIGH (ref 65–99)
Osmolality: 286 (ref 275–301)
Potassium: 4 mmol/L (ref 3.5–5.1)
Sodium: 143 mmol/L (ref 136–145)

## 2012-11-09 LAB — CK: CK, Total: 76 U/L (ref 21–215)

## 2012-11-09 LAB — TROPONIN I: Troponin-I: <0.02 ng/mL

## 2012-11-09 LAB — CK-MB: CK-MB: 0.7 ng/mL (ref 0.5–3.6)

## 2012-11-09 NOTE — Assessment & Plan Note (Addendum)
Symptoms of chest tightness, feeling of "elephant sitting on chest" have not subsided despite rescue inhaler, steroid injection. Now also with chest pain on inspiration.  Patient has Mirena IUD. Rule out cardiac. Will order EKG, stat cardiac enzymes, d-dimer at [facility]. If negative, will start trial of symbicort and refer to Pulmonology. If positive, will send patient to ED for further evaluation.

## 2012-11-09 NOTE — Telephone Encounter (Signed)
Called Hailey Wilkins to inform her that her cardiac panel was normal; however, her d-dimer was slightly elevated and needs further evaluation. Instructed her to go to ED at [facility]. I called the ED and spoke to the triage nurse to inform of patient needing further workup for elevated d-dimer.   Hailey Wilkins agreed to go to the ED.

## 2012-11-09 NOTE — Telephone Encounter (Signed)
Pt going to [place] [facility] for [employer] and has an appt with [name] at 3:15.

## 2012-11-09 NOTE — Progress Notes (Signed)
Subjective:    Patient ID: Hailey Wilkins, female    DOB: 22-May-1971, 42 y.o.   MRN: [id number]  HPI  Hailey Wilkins is a pleasant 41 y/o female with history of asthma who presents to clinic today with symptoms of chest tightness, "I feel like I have an elephant sitting on my chest", also c/o sharp pain in chest with inspiration. Reports mild cough. She has been using her rescue inhaler ~ q4h with little relief. Symptoms began on Monday. She was seen in our office on Wednesday and given a steroid injection. Reports that wheezing heard earlier in the week has subsided but she is still "not feeling well". Denies sinus pressure, fever, chills.   Current Outpatient Prescriptions on File Prior to Visit  Medication Sig Dispense Refill  . albuterol (VENTOLIN HFA) 108 (90 BASE) MCG/ACT inhaler Inhale 2 puffs into the lungs every 6 (six) hours as needed.      Marland Kitchen aspirin-acetaminophen-caffeine (EXCEDRIN MIGRAINE) 250-250-65 MG per tablet Take 1 tablet by mouth every 6 (six) hours as needed.      Marland Kitchen b complex vitamins tablet Take 1 tablet by mouth daily.      . benzonatate (TESSALON) 200 MG capsule Take 1 capsule (200 mg total) by mouth 2 (two) times daily as needed for cough.  20 capsule  0  . cetirizine (ZYRTEC) 10 MG tablet Take 10 mg by mouth daily.      . Cholecalciferol (VITAMIN D) 2000 UNITS tablet Take 2,000 Units by mouth daily.      . fluticasone (FLONASE) 50 MCG/ACT nasal spray Place 2 sprays into the nose 2 (two) times daily.      . nortriptyline (PAMELOR) 25 MG capsule Take 1 capsule (25 mg total) by mouth at bedtime.  30 capsule  6  . predniSONE (STERAPRED UNI-PAK) 10 MG tablet Take 60mg  day 1 then taper by 10mg  daily  21 tablet  0  . valACYclovir (VALTREX) 1000 MG tablet Take 1,000 mg by mouth daily as needed. For fever blister         Review of Systems  Constitutional: Negative for fever and chills.  HENT: Negative for ear pain, rhinorrhea, postnasal drip and sinus pressure.   Eyes:  Negative.   Respiratory: Positive for cough, chest tightness and shortness of breath. Negative for wheezing.   Cardiovascular:       Sharp chest pain with inspiration  Musculoskeletal: Negative.   Skin: Negative for rash.  Neurological: Negative for dizziness, syncope, weakness, light-headedness and headaches.  Psychiatric/Behavioral: Negative for behavioral problems and confusion. The patient is not nervous/anxious.     BP 131/74  Pulse 101  Temp 98.4 F (36.9 C) (Oral)  Ht 5\' 5"  (1.651 m)  Wt 166 lb (75.297 kg)  BMI 27.62 kg/m2  SpO2 98%     Objective:   Physical Exam  Constitutional: She is oriented to person, place, and time. She appears well-developed and well-nourished.  HENT:  Head: Normocephalic.  Right Ear: External ear normal.  Left Ear: External ear normal.  Mouth/Throat: Oropharynx is clear and moist.  Neck: No tracheal deviation present.  Cardiovascular: Regular rhythm and normal heart sounds.  Exam reveals no gallop.   No murmur heard.      Tachycardic  Pulmonary/Chest: Breath sounds normal. No stridor. She has no wheezes. She has no rales.  Lymphadenopathy:    She has no cervical adenopathy.  Neurological: She is alert and oriented to person, place, and time. Coordination normal.  Skin: Skin is warm  and dry. No rash noted.  Psychiatric: She has a normal mood and affect. Her behavior is normal. Judgment and thought content normal.          Assessment & Plan:

## 2012-11-09 NOTE — Patient Instructions (Signed)
  Please go to [facility] for labs.  We will call you today with results and further instructions.

## 2012-11-10 LAB — CK TOTAL AND CKMB (NOT AT ARMC)
CK, Total: 73 U/L (ref 21–215)
CK-MB: 0.9 ng/mL (ref 0.5–3.6)

## 2012-11-10 LAB — TROPONIN I: Troponin-I: <0.02 ng/mL

## 2012-11-12 ENCOUNTER — Encounter: Payer: Self-pay | Admitting: Adult Health

## 2012-11-12 ENCOUNTER — Encounter: Payer: Self-pay | Admitting: Internal Medicine

## 2012-11-12 ENCOUNTER — Other Ambulatory Visit: Payer: Self-pay

## 2012-11-12 DIAGNOSIS — R079 Chest pain, unspecified: Secondary | ICD-10-CM

## 2012-11-13 ENCOUNTER — Encounter: Payer: Self-pay | Admitting: *Deleted

## 2012-11-16 ENCOUNTER — Emergency Department (HOSPITAL_COMMUNITY)
Admission: EM | Admit: 2012-11-16 | Discharge: 2012-11-16 | Disposition: A | Discharge status: Home / Self Care | Payer: PRIVATE HEALTH INSURANCE | Attending: Emergency Medicine | Admitting: Emergency Medicine

## 2012-11-16 ENCOUNTER — Other Ambulatory Visit: Payer: Self-pay

## 2012-11-16 ENCOUNTER — Emergency Department (HOSPITAL_COMMUNITY): Payer: PRIVATE HEALTH INSURANCE

## 2012-11-16 ENCOUNTER — Encounter (HOSPITAL_COMMUNITY): Payer: Self-pay | Admitting: Emergency Medicine

## 2012-11-16 DIAGNOSIS — Z79899 Other long term (current) drug therapy: Secondary | ICD-10-CM | POA: Insufficient documentation

## 2012-11-16 DIAGNOSIS — J45901 Unspecified asthma with (acute) exacerbation: Secondary | ICD-10-CM | POA: Insufficient documentation

## 2012-11-16 DIAGNOSIS — Z87440 Personal history of urinary (tract) infections: Secondary | ICD-10-CM | POA: Insufficient documentation

## 2012-11-16 DIAGNOSIS — Z8619 Personal history of other infectious and parasitic diseases: Secondary | ICD-10-CM | POA: Insufficient documentation

## 2012-11-16 DIAGNOSIS — Z85828 Personal history of other malignant neoplasm of skin: Secondary | ICD-10-CM | POA: Insufficient documentation

## 2012-11-16 DIAGNOSIS — R0789 Other chest pain: Secondary | ICD-10-CM | POA: Insufficient documentation

## 2012-11-16 DIAGNOSIS — IMO0002 Reserved for concepts with insufficient information to code with codable children: Secondary | ICD-10-CM | POA: Insufficient documentation

## 2012-11-16 DIAGNOSIS — F329 Major depressive disorder, single episode, unspecified: Secondary | ICD-10-CM | POA: Insufficient documentation

## 2012-11-16 DIAGNOSIS — Z8719 Personal history of other diseases of the digestive system: Secondary | ICD-10-CM | POA: Insufficient documentation

## 2012-11-16 DIAGNOSIS — F3289 Other specified depressive episodes: Secondary | ICD-10-CM | POA: Insufficient documentation

## 2012-11-16 DIAGNOSIS — Z8679 Personal history of other diseases of the circulatory system: Secondary | ICD-10-CM | POA: Insufficient documentation

## 2012-11-16 DIAGNOSIS — R091 Pleurisy: Secondary | ICD-10-CM | POA: Insufficient documentation

## 2012-11-16 DIAGNOSIS — Z9089 Acquired absence of other organs: Secondary | ICD-10-CM | POA: Insufficient documentation

## 2012-11-16 LAB — CBC WITH DIFFERENTIAL/PLATELET
Basophils Absolute: 0 10*3/uL (ref 0.0–0.1)
Basophils Relative: 0 % (ref 0–1)
Eosinophils Absolute: 0.1 10*3/uL (ref 0.0–0.7)
Eosinophils Relative: 1 % (ref 0–5)
HCT: 43.9 % (ref 36.0–46.0)
Hemoglobin: 15.1 g/dL — ABNORMAL HIGH (ref 12.0–15.0)
Lymphocytes Relative: 22 % (ref 12–46)
Lymphs Abs: 2.7 10*3/uL (ref 0.7–4.0)
MCH: 28.8 pg (ref 26.0–34.0)
MCHC: 34.4 g/dL (ref 30.0–36.0)
MCV: 83.8 fL (ref 78.0–100.0)
Monocytes Absolute: 0.6 10*3/uL (ref 0.1–1.0)
Monocytes Relative: 5 % (ref 3–12)
Neutro Abs: 8.6 10*3/uL — ABNORMAL HIGH (ref 1.7–7.7)
Neutrophils Relative %: 71 % (ref 43–77)
Platelets: 360 10*3/uL (ref 150–400)
RBC: 5.24 MIL/uL — ABNORMAL HIGH (ref 3.87–5.11)
RDW: 13.4 % (ref 11.5–15.5)
WBC: 12 10*3/uL — ABNORMAL HIGH (ref 4.0–10.5)

## 2012-11-16 LAB — BASIC METABOLIC PANEL
BUN: 8 mg/dL (ref 6–23)
CO2: 26 mEq/L (ref 19–32)
Calcium: 9.5 mg/dL (ref 8.4–10.5)
Chloride: 99 mEq/L (ref 96–112)
Creatinine, Ser: 0.8 mg/dL (ref 0.50–1.10)
GFR calc Af Amer: >90 mL/min (ref >90)
GFR calc non Af Amer: 90 mL/min — ABNORMAL LOW (ref >90)
Glucose, Bld: 91 mg/dL (ref 70–99)
Potassium: 4.6 mEq/L (ref 3.5–5.1)
Sodium: 136 mEq/L (ref 135–145)

## 2012-11-16 LAB — D-DIMER, QUANTITATIVE: D-Dimer, Quant: 0.53 ug/mL-FEU — ABNORMAL HIGH (ref 0.00–0.48)

## 2012-11-16 LAB — POCT I-STAT TROPONIN I: Troponin i, poc: 0 ng/mL (ref 0.00–0.08)

## 2012-11-16 MED ORDER — ONDANSETRON HCL 4 MG/2ML IJ SOLN
4.0000 mg | Freq: Once | INTRAMUSCULAR | Status: AC
  Administered 2012-11-16: 4 mg via INTRAVENOUS
  Filled 2012-11-16: qty 2

## 2012-11-16 MED ORDER — LORAZEPAM 1 MG PO TABS: 1.0000 mg | ORAL_TABLET | Freq: Three times a day (TID) | ORAL | Status: DC | PRN

## 2012-11-16 MED ORDER — LORAZEPAM 2 MG/ML IJ SOLN: 1.0000 mg | Freq: Once | INTRAMUSCULAR | Status: DC

## 2012-11-16 MED ORDER — MORPHINE SULFATE 30 MG PO TABS: 30.0000 mg | ORAL_TABLET | ORAL | Status: DC | PRN

## 2012-11-16 MED ORDER — SODIUM CHLORIDE 0.9 % IV BOLUS (SEPSIS)
1000.0000 mL | Freq: Once | INTRAVENOUS | Status: AC
  Administered 2012-11-16: 1000 mL via INTRAVENOUS

## 2012-11-16 MED ORDER — IOHEXOL 350 MG/ML SOLN
80.0000 mL | Freq: Once | INTRAVENOUS | Status: AC | PRN
  Administered 2012-11-16: 80 mL via INTRAVENOUS

## 2012-11-16 MED ORDER — ONDANSETRON 4 MG PO TBDP: 4.0000 mg | ORAL_TABLET | Freq: Three times a day (TID) | ORAL | Status: DC | PRN

## 2012-11-16 MED ORDER — HYDROMORPHONE HCL PF 1 MG/ML IJ SOLN
1.0000 mg | Freq: Once | INTRAMUSCULAR | Status: AC
  Administered 2012-11-16: 1 mg via INTRAVENOUS
  Filled 2012-11-16: qty 1

## 2012-11-16 MED ORDER — LORAZEPAM 2 MG/ML IJ SOLN
1.0000 mg | Freq: Once | INTRAMUSCULAR | Status: AC
  Administered 2012-11-16: 1 mg via INTRAVENOUS
  Filled 2012-11-16: qty 1

## 2012-11-16 NOTE — ED Notes (Signed)
Patient denies nausea and vomiting  ?

## 2012-11-16 NOTE — ED Notes (Signed)
MD at bedside. 

## 2012-11-16 NOTE — ED Notes (Signed)
Pt transported to CT ?

## 2012-11-16 NOTE — ED Provider Notes (Signed)
History     CSN: [id number]  Arrival date & time 11/16/12  1258   First MD Initiated Contact with Patient 11/16/12 1525      Chief Complaint  Patient presents with  . Chest Pain  . Shortness of Breath    (Consider location/radiation/quality/duration/timing/severity/associated sxs/prior treatment) HPI Comments: Patient is a 41 year old female with a past medical history of asthma who presents with chest pain that started suddenly 2 weeks ago. Patient reports sudden onset of chest pain that is characterized as "pressure" that is located in the left chest and radiates to her left shoulder and to her back between her shoulder blades. The patient reports associated SOB. The pain has been constant the past 2 weeks. No aggravating factors including any movement or exertion. No alleviating factors. Patient denies fever, headache, NVD, diaphoresis, dizziness, visual changes, abdominal pain, numbness/tingling.     Past Medical History  Diagnosis Date  . History of chicken pox   . Headache   . GERD (gastroesophageal reflux disease)   . Migraines     associated with menses in past, tx with excedrin, may last up to 1 week  . UTI (lower urinary tract infection)   . Breast cyst 2011    bx benign  . Shingles 04/2011  . Internal hemorrhoid 2006    dx during colon w/Dr Markham Jordan  . Allergic rhinitis     Dr. Bethena Midget, s/p allergy testing  . Basal cell carcinoma of anterior chest     removed  . Asthma   . Depression     after father died, on [place] in past  . Sinusitis   . Migraines     Past Surgical History  Procedure Date  . Tonsillectomy 1988  . Nasal sinus surgery 1999, 2005  . Knee arthroscopy w/ meniscal repair 1989    Right, Dr. Gerrit Heck  . Basal cell carcinoma excision 2011    removed from chest/[facility]  . Intrauterine device insertion 01/2011    Dr Hyacinth Meeker  . Vaginal delivery     x 2  . Breast biopsy 2011    At [facility] /Dx Cyst  . Cholecystectomy 2010    [facility]  . Colonoscopy     Family History  Problem Relation Age of Onset  . Hypertension Mother   . Cancer Mother 35    Breast Ca  . Arthritis Mother   . Cancer Father 39    Pancreatic  . Diabetes Father   . Hypertension Father   . Hyperlipidemia Father   . Heart disease Father   . Cancer Maternal Grandmother     Stomach - 90's  . Alcohol abuse Maternal Grandfather   . Alcohol abuse Paternal Grandfather   . Heart disease Paternal Grandfather   . Hyperlipidemia Paternal Grandfather   . Hypertension Paternal Grandfather   . Cancer Sister 87    Thyroid    History  Substance Use Topics  . Smoking status: Never Smoker   . Smokeless tobacco: Never Used  . Alcohol Use: Yes     Comment: 2 times week/Wine    OB History    Grav Para Term Preterm Abortions TAB SAB Ect Mult Living                  Review of Systems  Respiratory: Positive for shortness of breath.   Cardiovascular: Positive for chest pain.  All other systems reviewed and are negative.    Allergies  Amoxicillin; Erythromycin; Levofloxacin;  Rocephin; Tetracyclines & related; Amoxicillin-pot clavulanate; Clindamycin/lincomycin; Codeine; and Sulfa antibiotics  Home Medications   Current Outpatient Rx  Name  Route  Sig  Dispense  Refill  . ALBUTEROL SULFATE HFA 108 (90 BASE) MCG/ACT IN AERS   Inhalation   Inhale 2 puffs into the lungs every 6 (six) hours as needed.         . ASPIRIN-ACETAMINOPHEN-CAFFEINE 250-250-65 MG PO TABS   Oral   Take 1 tablet by mouth every 6 (six) hours as needed.         . B COMPLEX PO TABS   Oral   Take 1 tablet by mouth daily.         Marland Kitchen BENZONATATE 200 MG PO CAPS   Oral   Take 1 capsule (200 mg total) by mouth 2 (two) times daily as needed for cough.   20 capsule   0   . CETIRIZINE HCL 10 MG PO TABS   Oral   Take 10 mg by mouth daily.         Marland Kitchen VITAMIN D 2000 UNITS PO TABS   Oral   Take 2,000 Units by mouth daily.         Marland Kitchen FLUTICASONE  PROPIONATE 50 MCG/ACT NA SUSP   Nasal   Place 2 sprays into the nose 2 (two) times daily.         Marland Kitchen NORTRIPTYLINE HCL 25 MG PO CAPS   Oral   Take 1 capsule (25 mg total) by mouth at bedtime.   30 capsule   6   . PREDNISONE (PAK) 10 MG PO TABS      Take 60mg  day 1 then taper by 10mg  daily   21 tablet   0   . VALACYCLOVIR HCL 1 G PO TABS   Oral   Take 1,000 mg by mouth daily as needed. For fever blister           BP 133/94  Pulse 103  Temp 97.9 F (36.6 C) (Oral)  Resp 16  SpO2 100%  Physical Exam  Nursing note and vitals reviewed. Constitutional: She is oriented to person, place, and time. She appears well-developed and well-nourished. No distress.  HENT:  Head: Normocephalic and atraumatic.  Mouth/Throat: Oropharynx is clear and moist. No oropharyngeal exudate.  Eyes: Conjunctivae normal and EOM are normal. Pupils are equal, round, and reactive to light. No scleral icterus.  Neck: Normal range of motion. Neck supple.  Cardiovascular: Normal rate, regular rhythm and intact distal pulses.  Exam reveals no gallop and no friction rub.   No murmur heard.      No ankle edema.   Pulmonary/Chest: Effort normal and breath sounds normal. She has no wheezes. She has no rales. She exhibits no tenderness.       Patient reports pain with inspiration.   Abdominal: Soft. She exhibits no distension. There is no tenderness. There is no rebound and no guarding.  Musculoskeletal: Normal range of motion.       No calf tenderness to palpation.  Neurological: She is alert and oriented to person, place, and time. Coordination normal.       Speech is goal-oriented. Moves limbs without ataxia.   Skin: Skin is warm and dry. She is not diaphoretic.  Psychiatric: She has a normal mood and affect. Her behavior is normal.    ED Course  Procedures (including critical care time)  Labs Reviewed  CBC WITH DIFFERENTIAL - Abnormal; Notable for the following:    WBC 12.0 (*)  RBC 5.24 (*)      Hemoglobin 15.1 (*)     Neutro Abs 8.6 (*)     All other components within normal limits  BASIC METABOLIC PANEL - Abnormal; Notable for the following:    GFR calc non Af Amer 90 (*)     All other components within normal limits  D-DIMER, QUANTITATIVE - Abnormal; Notable for the following:    D-Dimer, Quant 0.53 (*)     All other components within normal limits  POCT I-STAT TROPONIN I   Dg Chest 2 View  11/16/2012  *RADIOLOGY REPORT*  Clinical Data: Chest pain, shortness of breath.  CHEST - 2 VIEW  Comparison: 11/09/2012  Findings: Heart and mediastinal contours are within normal limits. No focal opacities or effusions.  No acute bony abnormality.  IMPRESSION: No active cardiopulmonary disease.   Original Report Authenticated By: Charlett Nose, M.D.      1. Pleurisy       MDM  4:15 PM Patient will have dilaudid, fluids, and zofran. Troponin negative. Chest xray unremarkable.   9:00 PM Chest CT unremarkable. Labs unremarkable. I informed the patient of negative results. Dr. Effie Shy also spoke with the patient. I will discharge her with ativan, percocet, and zofran with instructions to follow up with pulmonology as needed for further evaluation and management. No further evaluation needed at this time.      Emilia Beck, [place] 11/17/12 [phone]

## 2012-11-16 NOTE — ED Provider Notes (Signed)
Hailey Wilkins is a 41 y.o. female who is here for evaluation of chest tightness, and shortness of breath. Symptoms are subacute, and persistent. She has been comprehensively evaluated previously and been referred for headaches stress testing; which is scheduled for 11/19/2012.   Exam- she is anxious  Heart rate, while I evaluated her was fluctuating between 104 and 115 Neurologic no focal asymmetry.  Respiratory-  No distress, normal effort   Assessment: Nonspecific chest pain, and shortness of breath. No evidence for ACS, PE, pneumonia, or significant bronchospasm. Patient stable for discharge with outpatient management.  Nursing notes, applicable records and vitals reviewed.  Radiologic Images/Reports reviewed.    Date: 09/07/2012  Rate: 132  Rhythm: sinus tachycardia  QRS Axis: normal  PR and QT Intervals: normal  ST/T Wave abnormalities: normal  PR and QRS Conduction Disutrbances:none  Narrative Interpretation:   Old EKG Reviewed: none available   Plan: Home Medications- Ativan, Percocet, Zofran ; Home Treatments- rest; Recommended follow up- PCP as scheduled, and I recommended a referral to a pulmonologist for further testing that might include pulmonary function testing    Medical screening examination/treatment/procedure(s) were conducted as a shared visit with non-physician practitioner(s) and myself.  I personally evaluated the patient during the encounter  Flint Melter, MD 11/16/12 2318

## 2012-11-16 NOTE — ED Notes (Signed)
EKG signed by Dr. Ranae Palms.  Attached to pt stickers at this time

## 2012-11-16 NOTE — ED Notes (Addendum)
Patient now complains of pain in the left chest area. It is intermittent in nature an she rates the pain a "6" on the pain scale

## 2012-11-16 NOTE — ED Notes (Signed)
Pt c/o CP and tightness with SOB; pt sts x 2 weeks and was seen at [facility] and had VQ scan that was negative; pt sts still having pain and tightness; pt noted to be tachycardic; pt appears anxious and sts cant catch her breath

## 2012-11-16 NOTE — ED Notes (Signed)
Patient placed in room A9

## 2012-11-16 NOTE — ED Notes (Signed)
Back from CT placed on all monitors, Patient states she is feeling a little better

## 2012-11-18 ENCOUNTER — Encounter: Payer: Self-pay | Admitting: Internal Medicine

## 2012-11-19 ENCOUNTER — Encounter: Payer: Self-pay | Admitting: Nurse Practitioner

## 2012-11-19 ENCOUNTER — Encounter: Payer: Self-pay | Admitting: Cardiovascular Disease

## 2012-11-19 ENCOUNTER — Ambulatory Visit (INDEPENDENT_AMBULATORY_CARE_PROVIDER_SITE_OTHER): Payer: PRIVATE HEALTH INSURANCE | Admitting: Nurse Practitioner

## 2012-11-19 ENCOUNTER — Ambulatory Visit (INDEPENDENT_AMBULATORY_CARE_PROVIDER_SITE_OTHER): Payer: PRIVATE HEALTH INSURANCE | Admitting: Cardiovascular Disease

## 2012-11-19 ENCOUNTER — Ambulatory Visit: Payer: PRIVATE HEALTH INSURANCE | Admitting: Nurse Practitioner

## 2012-11-19 VITALS — BP 120/80 | HR 116 | Ht 62.0 in | Wt 166.0 lb

## 2012-11-19 DIAGNOSIS — R0600 Dyspnea, unspecified: Secondary | ICD-10-CM

## 2012-11-19 DIAGNOSIS — R079 Chest pain, unspecified: Secondary | ICD-10-CM

## 2012-11-19 DIAGNOSIS — R0989 Other specified symptoms and signs involving the circulatory and respiratory systems: Secondary | ICD-10-CM

## 2012-11-19 DIAGNOSIS — R0609 Other forms of dyspnea: Secondary | ICD-10-CM

## 2012-11-19 DIAGNOSIS — R071 Chest pain on breathing: Secondary | ICD-10-CM

## 2012-11-19 MED ORDER — IBUPROFEN 600 MG PO TABS: 600.0000 mg | ORAL_TABLET | Freq: Three times a day (TID) | ORAL | Status: DC

## 2012-11-19 NOTE — Procedures (Signed)
Exercise Treadmill Test  Pre-Exercise Testing Evaluation Rhythm: normal sinus  Rate: 121   PR:  .16 QRS:  .08  QT:  .32 QTc: .44   P axis: nl  QRS axis:  nl  ST Segments:  no significant ST changes at rest     Test  Exercise Tolerance Test Ordering MD: Concha Se  Interpreting MD: Flavia Shipper, NP  Unique Test No:   Treadmill:  [place]  Indication for ETT: chest pain - rule out ischemia  Contraindication to ETT: No   Stress Modality: exercise - treadmill  Cardiac Imaging Performed: non   Protocol: standard Bruce - maximal  Max BP:  160/94  Max MPHR (bpm):  178 85% MPR (bpm):  151  MPHR obtained (bpm):  174 % MPHR obtained:  99  Reached 85% MPHR (min:sec):  2:50 Total Exercise Time (min-sec):  9:00  Workload in METS:  10.1 Borg Scale: n/a  Reason ETT Terminated:  dyspnea    ST Segment Analysis At Rest: normal ST segments - no evidence of significant ST depression With Exercise: no evidence of significant ST depression  Other Information Arrhythmia:  No Angina during ETT:  present (1) Quality of ETT:  diagnostic  ETT Interpretation:  normal - no evidence of ischemia by ST analysis  Comments: Pt c/o of pleuritic chest discomfort prior to, throughout, and after exercise study.  No acute ST/T changes noted.  VSS.  Recommendations: Pt to see Dr. Kirke Corin today.

## 2012-11-19 NOTE — Progress Notes (Signed)
HPI  This is a 41 year old pleasant female who is here today for evaluation of chest pain. She has no previous cardiac history and no significant risk factors for coronary artery disease except a family history. Her father had coronary artery disease requiring revascularization in his early 52s. She is a lifelong nonsmoker. She has known history of chronic asthma which has been overall mild well controlled. Few weeks ago she started complaining of substernal heaviness as well as sharp pain when she takes a deep breath. This was associated with dyspnea, fatigue and decreased exercise ability. Before that, she was able to exercise regularly and the elliptical for an hour. She noticed tachycardia and palpitations as well. She was treated with prednisone for presumed asthma exacerbation with partial improvement in symptoms. However, her symptoms worsened again. She was sent to the emergency room at [facility] on December 20 where she was found to have elevated d-dimer. The rest of her labs were unremarkable. She had a VQ scan done which was low probability for pulmonary embolism. If her symptoms worsened again and she went to the emergency room at [facility] on December 27. She had a CTA of the chest which showed no evidence of pulmonary embolism or other abnormalities. She was discharged home on morphine and Ativan for presumed anxiety. She underwent a treadmill stress test today which showed no evidence of ischemia.  Allergies  Allergen Reactions  . Amoxicillin Anaphylaxis  . Erythromycin Anaphylaxis  . Levofloxacin Anaphylaxis  . Rocephin (Ceftriaxone Sodium In Dextrose) Anaphylaxis  . Shellfish Allergy Anaphylaxis  . Tetracyclines & Related Anaphylaxis  . Amoxicillin-Pot Clavulanate Hives  . Clindamycin/Lincomycin Hives  . Codeine Hives  . Latex   . Levaquin (Levofloxacin)   . Sulfa Antibiotics Hives     Current Outpatient Prescriptions on File Prior to Visit  Medication Sig Dispense Refill  .  albuterol (VENTOLIN HFA) 108 (90 BASE) MCG/ACT inhaler Inhale 2 puffs into the lungs every 6 (six) hours as needed. For shortness of breath      . aspirin-acetaminophen-caffeine (EXCEDRIN MIGRAINE) 250-250-65 MG per tablet Take 1 tablet by mouth every 6 (six) hours as needed.      Marland Kitchen b complex vitamins tablet Take 1 tablet by mouth daily.      . benzonatate (TESSALON) 200 MG capsule Take 1 capsule (200 mg total) by mouth 2 (two) times daily as needed for cough.  20 capsule  0  . cetirizine (ZYRTEC) 10 MG tablet Take 10 mg by mouth daily.      . Cholecalciferol (VITAMIN D) 2000 UNITS tablet Take 2,000 Units by mouth daily.      . fluticasone (FLONASE) 50 MCG/ACT nasal spray Place 2 sprays into the nose 2 (two) times daily.      . Multiple Vitamin (MULTIVITAMIN WITH MINERALS) TABS Take 1 tablet by mouth daily.      . nortriptyline (PAMELOR) 25 MG capsule Take 1 capsule (25 mg total) by mouth at bedtime.  30 capsule  6  . ondansetron (ZOFRAN ODT) 4 MG disintegrating tablet Take 1 tablet (4 mg total) by mouth every 8 (eight) hours as needed for nausea.  10 tablet  0  . predniSONE (STERAPRED UNI-PAK) 10 MG tablet Take 60mg  day 1 then taper by 10mg  daily  21 tablet  0  . valACYclovir (VALTREX) 1000 MG tablet Take 1,000 mg by mouth daily as needed. For fever blister         Past Medical History  Diagnosis Date  .  History of chicken pox   . Headache   . GERD (gastroesophageal reflux disease)   . Migraines     associated with menses in past, tx with excedrin, may last up to 1 week  . UTI (lower urinary tract infection)   . Breast cyst 2011    bx benign  . Shingles 04/2011  . Internal hemorrhoid 2006    dx during colon w/Dr Markham Jordan  . Allergic rhinitis     Dr. Bethena Midget, s/p allergy testing  . Basal cell carcinoma of anterior chest     removed  . Asthma   . Depression     after father died, on Pristique in past  . Sinusitis   . Migraines      Past Surgical History  Procedure Date  .  Tonsillectomy 1988  . Nasal sinus surgery 1999, 2005  . Knee arthroscopy w/ meniscal repair 1989    Right, Dr. Gerrit Heck  . Basal cell carcinoma excision 2011    removed from chest/[place] [facility]  . Intrauterine device insertion 01/2011    Dr Hyacinth Meeker  . Vaginal delivery     x 2  . Breast biopsy 2011    At [facility] /Dx Cyst  . Cholecystectomy 2010    [facility]  . Colonoscopy      Family History  Problem Relation Age of Onset  . Hypertension Mother   . Cancer Mother 22    Breast Ca  . Arthritis Mother   . Cancer Father 16    Pancreatic  . Diabetes Father   . Hypertension Father   . Hyperlipidemia Father   . Heart disease Father   . Cancer Maternal Grandmother     Stomach - 90's  . Alcohol abuse Maternal Grandfather   . Alcohol abuse Paternal Grandfather   . Heart disease Paternal Grandfather   . Hyperlipidemia Paternal Grandfather   . Hypertension Paternal Grandfather   . Cancer Sister 3    Thyroid     History   Social History  . Marital Status: Married    Spouse Name: N/A    Number of Children: 2  . Years of Education: N/A   Occupational History  . 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 Smoking status: Never Smoker   . Smokeless tobacco: Never Used  . Alcohol Use: Yes     Comment: 2 times week/Wine  . Drug Use: No  . Sexually Active: Not on file   Other Topics Concern  . Not on file   Social History Narrative   Lives in [place] with husband and 19YO and 11YO. Works at [employer]. Dog and Cat.Regular Exercise -  GYM, wts, walking, elliptical - 3 times a week x 1 hourDaily Caffeine Use:  3 cups coffee, 1 diet soda     ROS Constitutional: Negative for fever, chills, diaphoresis, activity change, appetite change. HENT: Negative for hearing loss, nosebleeds, congestion, sore throat, facial swelling, drooling, trouble swallowing, neck pain, voice change, sinus pressure and tinnitus.  Eyes:  Negative for photophobia, pain, discharge and visual disturbance.  Respiratory: Negative for apnea, cough and wheezing.  Cardiovascular: Negative for  leg swelling.  Gastrointestinal: Negative for nausea, vomiting, abdominal pain, diarrhea, constipation, blood in stool and abdominal distention.  Genitourinary: Negative for dysuria, urgency, frequency, hematuria and decreased urine volume.  Musculoskeletal: Negative for  joint swelling, arthralgias and gait problem. Positive for mild myalgia and back pain. Skin: Negative for color change,  pallor, rash and wound.  Neurological: Negative for dizziness, tremors, seizures, syncope, speech difficulty, weakness, light-headedness, numbness and headaches.  Psychiatric/Behavioral: Negative for suicidal ideas, hallucinations, behavioral problems and agitation. The patient is not nervous/anxious.     PHYSICAL EXAM   BP 120/80  Pulse 116  Ht 5\' 2"  (1.575 m)  Wt 166 lb (75.297 kg)  BMI 30.36 kg/m2  Constitutional: She is oriented to person, place, and time. She appears well-developed and well-nourished. No distress.  HENT: No nasal discharge.  Head: Normocephalic and atraumatic.  Eyes: Pupils are equal and round. Right eye exhibits no discharge. Left eye exhibits no discharge.  Neck: Normal range of motion. Neck supple. No JVD present. No thyromegaly present.  Cardiovascular: Normal rate, regular rhythm, normal heart sounds. Exam reveals no gallop and no friction rub. No murmur heard.  Pulmonary/Chest: Effort normal and breath sounds normal. No stridor. No respiratory distress. She has no wheezes. She has no rales. She exhibits no tenderness.  Abdominal: Soft. Bowel sounds are normal. She exhibits no distension. There is no tenderness. There is no rebound and no guarding.  Musculoskeletal: Normal range of motion. She exhibits no edema and no tenderness.  Neurological: She is alert and oriented to person, place, and time. Coordination normal.  Skin:  Skin is warm and dry. No rash noted. She is not diaphoretic. No erythema. No pallor.  Psychiatric: She has a normal mood and affect. Her behavior is normal. Judgment and thought content normal.      ASSESSMENT AND PLAN

## 2012-11-19 NOTE — Patient Instructions (Addendum)
Stop Morphine Stop Ativan.  Start Ibuprofen 600 mg 3 times daily for 5 days.   Your physician has requested that you have an echocardiogram. Echocardiography is a painless test that uses sound waves to create images of your heart. It provides your doctor with information about the size and shape of your heart and how well your heart's chambers and valves are working. This procedure takes approximately one hour. There are no restrictions for this procedure.  Follow up after echo.

## 2012-11-19 NOTE — Assessment & Plan Note (Signed)
Her symptoms are highly suggestive of pericarditis. Her chest pain is pleuritic and has been associated with tachycardia, vague muscle aches and decreased exercise capacity. Some of her ECGs show minor ST elevation. D-dimer was mildly elevated but there was no evidence of pulmonary embolism. Treadmill stress test showed no evidence of ischemia. She continues to have significant symptoms. I recommend ibuprofen 600 mg 3 times daily for 5 days. I will request an echocardiogram for further evaluation. She is to followup after echocardiogram to evaluate her response to treatment. I am concerned about her persistent tachycardia.

## 2012-11-20 ENCOUNTER — Other Ambulatory Visit: Payer: Self-pay

## 2012-11-20 ENCOUNTER — Other Ambulatory Visit (INDEPENDENT_AMBULATORY_CARE_PROVIDER_SITE_OTHER): Payer: PRIVATE HEALTH INSURANCE

## 2012-11-20 DIAGNOSIS — R002 Palpitations: Secondary | ICD-10-CM

## 2012-11-20 DIAGNOSIS — R0600 Dyspnea, unspecified: Secondary | ICD-10-CM

## 2012-11-20 DIAGNOSIS — R000 Tachycardia, unspecified: Secondary | ICD-10-CM

## 2012-11-20 DIAGNOSIS — R0602 Shortness of breath: Secondary | ICD-10-CM

## 2012-11-30 ENCOUNTER — Encounter: Payer: Self-pay | Admitting: Adult Health

## 2012-12-07 ENCOUNTER — Encounter: Payer: Self-pay | Admitting: Cardiovascular Disease

## 2012-12-07 ENCOUNTER — Ambulatory Visit (INDEPENDENT_AMBULATORY_CARE_PROVIDER_SITE_OTHER): Payer: PRIVATE HEALTH INSURANCE | Admitting: Cardiovascular Disease

## 2012-12-07 VITALS — BP 118/82 | HR 106 | Ht 62.0 in | Wt 168.5 lb

## 2012-12-07 DIAGNOSIS — R0602 Shortness of breath: Secondary | ICD-10-CM

## 2012-12-07 DIAGNOSIS — R071 Chest pain on breathing: Secondary | ICD-10-CM

## 2012-12-07 DIAGNOSIS — I498 Other specified cardiac arrhythmias: Secondary | ICD-10-CM

## 2012-12-07 DIAGNOSIS — R000 Tachycardia, unspecified: Secondary | ICD-10-CM

## 2012-12-07 MED ORDER — METOPROLOL SUCCINATE ER 25 MG PO TB24: 25.0000 mg | ORAL_TABLET | Freq: Every day | ORAL | Status: DC

## 2012-12-07 NOTE — Patient Instructions (Addendum)
Start Metoprolol ER 25 mg once daily.  Start an exercise program.  Follow up in 3 months.

## 2012-12-11 ENCOUNTER — Institutional Professional Consult (permissible substitution): Payer: PRIVATE HEALTH INSURANCE | Admitting: Pulmonary Disease

## 2012-12-14 ENCOUNTER — Encounter: Payer: Self-pay | Admitting: Cardiovascular Disease

## 2012-12-15 ENCOUNTER — Encounter: Payer: Self-pay | Admitting: Cardiovascular Disease

## 2012-12-15 DIAGNOSIS — R000 Tachycardia, unspecified: Secondary | ICD-10-CM | POA: Insufficient documentation

## 2012-12-15 NOTE — Progress Notes (Signed)
HPI  This is a 42 year old pleasant female who is here today for a followup visit regarding  chest pain and sinus tachycardia. She has no previous cardiac history and no significant risk factors for coronary artery disease except a family history. Her father had coronary artery disease requiring revascularization in his early 89s. She is a lifelong nonsmoker. She has known history of chronic asthma which has been overall mild well controlled. Few weeks ago she started complaining of substernal heaviness as well as sharp pain when she takes a deep breath. This was associated with dyspnea, fatigue and decreased exercise ability. Before that, she was able to exercise regularly and the elliptical for an hour. She noticed tachycardia and palpitations as well. She was treated with prednisone for presumed asthma exacerbation with partial improvement in symptoms. However, her symptoms worsened again. She was sent to the emergency room at [facility] on December 20 where she was found to have elevated d-dimer. The rest of her labs were unremarkable. She had a VQ scan done which was low probability for pulmonary embolism. Her symptoms worsened again and she went to the emergency room at [facility] on December 27. She had a CTA of the chest which showed no evidence of pulmonary embolism or other abnormalities.  She underwent a treadmill stress test which showed no evidence of ischemia. Echocardiogram was normal. I felt that her symptoms might have been related to pericarditis. I treated her with NSAIDs. Her chest pain improved. However, she continues to feel palpitations with resting tachycardia.  Allergies  Allergen Reactions  . Amoxicillin Anaphylaxis  . Erythromycin Anaphylaxis  . Levofloxacin Anaphylaxis  . Rocephin (Ceftriaxone Sodium In Dextrose) Anaphylaxis  . Shellfish Allergy Anaphylaxis  . Tetracyclines & Related Anaphylaxis  . Amoxicillin-Pot Clavulanate Hives  . Clindamycin/Lincomycin Hives  . Codeine  Hives  . Latex   . Levaquin (Levofloxacin)   . Sulfa Antibiotics Hives     Current Outpatient Prescriptions on File Prior to Visit  Medication Sig Dispense Refill  . albuterol (VENTOLIN HFA) 108 (90 BASE) MCG/ACT inhaler Inhale 2 puffs into the lungs every 6 (six) hours as needed. For shortness of breath      . aspirin-acetaminophen-caffeine (EXCEDRIN MIGRAINE) 250-250-65 MG per tablet Take 1 tablet by mouth every 6 (six) hours as needed.      Marland Kitchen b complex vitamins tablet Take 1 tablet by mouth daily.      . cetirizine (ZYRTEC) 10 MG tablet Take 10 mg by mouth daily.      . Cholecalciferol (VITAMIN D) 2000 UNITS tablet Take 2,000 Units by mouth daily.      . fluticasone (FLONASE) 50 MCG/ACT nasal spray Place 2 sprays into the nose 2 (two) times daily.      . Multiple Vitamin (MULTIVITAMIN WITH MINERALS) TABS Take 1 tablet by mouth daily.      . nortriptyline (PAMELOR) 25 MG capsule Take 1 capsule (25 mg total) by mouth at bedtime.  30 capsule  6  . valACYclovir (VALTREX) 1000 MG tablet Take 1,000 mg by mouth daily as needed. For fever blister      . metoprolol succinate (TOPROL XL) 25 MG 24 hr tablet Take 1 tablet (25 mg total) by mouth daily.  30 tablet  6     Past Medical History  Diagnosis Date  . History of chicken pox   . Headache   . GERD (gastroesophageal reflux disease)   . Migraines     associated with menses in past, tx  with excedrin, may last up to 1 week  . UTI (lower urinary tract infection)   . Breast cyst 2011    bx benign  . Shingles 04/2011  . Internal hemorrhoid 2006    dx during colon w/Dr Markham Jordan  . Allergic rhinitis     Dr. Bethena Midget, s/p allergy testing  . Basal cell carcinoma of anterior chest     removed  . Asthma   . Depression     after father died, on Pristique in past  . Sinusitis   . Migraines      Past Surgical History  Procedure Date  . Tonsillectomy 1988  . Nasal sinus surgery 1999, 2005  . Knee arthroscopy w/ meniscal repair 1989    Right,  Dr. Gerrit Heck  . Basal cell carcinoma excision 2011    removed from chest/[place] [facility]  . Intrauterine device insertion 01/2011    Dr Hyacinth Meeker  . Vaginal delivery     x 2  . Breast biopsy 2011    At [facility] /Dx Cyst  . Cholecystectomy 2010    [facility]  . Colonoscopy      Family History  Problem Relation Age of Onset  . Hypertension Mother   . Cancer Mother 4    Breast Ca  . Arthritis Mother   . Cancer Father 28    Pancreatic  . Diabetes Father   . Hypertension Father   . Hyperlipidemia Father   . Heart disease Father   . Cancer Maternal Grandmother     Stomach - 90's  . Alcohol abuse Maternal Grandfather   . Alcohol abuse Paternal Grandfather   . Heart disease Paternal Grandfather   . Hyperlipidemia Paternal Grandfather   . Hypertension Paternal Grandfather   . Cancer Sister 44    Thyroid     History   Social History  . Marital Status: Married    Spouse Name: N/A    Number of Children: 2  . 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 Smoking status: Never Smoker   . Smokeless tobacco: Never Used  . Alcohol Use: Yes     Comment: 2 times week/Wine  . Drug Use: No  . Sexually Active: Not on file   Other Topics Concern  . Not on file   Social History Narrative   Lives in [place] with husband and 19YO and 11YO. Works at [employer]. Dog and Cat.Regular Exercise -  GYM, wts, walking, elliptical - 3 times a week x 1 hourDaily Caffeine Use:  3 cups coffee, 1 diet soda      PHYSICAL EXAM   BP 118/82  Pulse 106  Ht 5\' 2"  (1.575 m)  Wt 168 lb 8 oz (76.431 kg)  BMI 30.82 kg/m2  Constitutional: She is oriented to person, place, and time. She appears well-developed and well-nourished. No distress.  HENT: No nasal discharge.  Head: Normocephalic and atraumatic.  Eyes: Pupils are equal and round. Right eye exhibits no discharge. Left eye exhibits no  discharge.  Neck: Normal range of motion. Neck supple. No JVD present. No thyromegaly present.  Cardiovascular: Normal rate, regular rhythm, normal heart sounds. Exam reveals no gallop and no friction rub. No murmur heard.  Pulmonary/Chest: Effort normal and breath sounds normal. No stridor. No respiratory distress. She has no wheezes. She has no rales. She exhibits no tenderness.  Abdominal: Soft. Bowel sounds are normal.  She exhibits no distension. There is no tenderness. There is no rebound and no guarding.  Musculoskeletal: Normal range of motion. She exhibits no edema and no tenderness.  Neurological: She is alert and oriented to person, place, and time. Coordination normal.  Skin: Skin is warm and dry. No rash noted. She is not diaphoretic. No erythema. No pallor.  Psychiatric: She has a normal mood and affect. Her behavior is normal. Judgment and thought content normal.    [id number]  Tachycardia  WITHIN NORMAL LIMITS  ASSESSMENT AND PLAN

## 2012-12-15 NOTE — Assessment & Plan Note (Signed)
The exact etiology for this is not entirely clear. Her labs were unremarkable including normal thyroid function. She is symptomatic with palpitations. Thus, I will start her on small dose Toprol 25 mg once daily. I expect that this would be only for short-term use. I advised her to start an exercise program again.

## 2012-12-15 NOTE — Assessment & Plan Note (Signed)
Her symptoms improved after treatment with ibuprofen. Likely diagnosis was pericarditis. Workup for PE was negative including VQ scan and CTA. Treadmill stress test was normal.

## 2012-12-18 ENCOUNTER — Ambulatory Visit (INDEPENDENT_AMBULATORY_CARE_PROVIDER_SITE_OTHER): Payer: PRIVATE HEALTH INSURANCE | Admitting: Pulmonary Disease

## 2012-12-18 ENCOUNTER — Encounter: Payer: Self-pay | Admitting: Pulmonary Disease

## 2012-12-18 VITALS — BP 102/74 | HR 87 | Temp 98.7°F | Ht 62.0 in | Wt 167.0 lb

## 2012-12-18 DIAGNOSIS — J302 Other seasonal allergic rhinitis: Secondary | ICD-10-CM | POA: Insufficient documentation

## 2012-12-18 DIAGNOSIS — J45909 Unspecified asthma, uncomplicated: Secondary | ICD-10-CM

## 2012-12-18 DIAGNOSIS — J309 Allergic rhinitis, unspecified: Secondary | ICD-10-CM

## 2012-12-18 MED ORDER — BECLOMETHASONE DIPROPIONATE 40 MCG/ACT IN AERS: 2.0000 | INHALATION_SPRAY | Freq: Two times a day (BID) | RESPIRATORY_TRACT | Status: DC

## 2012-12-18 NOTE — Patient Instructions (Addendum)
In the spring, start using the QVar two puffs twice a day no matter how you feel until the pollen goes away or your sinus symptoms improve  Use [facility] Huger Med rinses (or Netti pot) with distilled water at least twice per day using the instructions on the package. 1/2 hour after using the [facility] Med rinse, use Nasonex (or your steroid nasal spray) two puffs in each nostril once per day. Use chlortrimeton and an over the counter decongestant (ask the pharmacist for a recommendation) as needed for the cough. Chlortrimeton can be substituted with zyrtec, claritin, allegra  We will see you back in 6 months or sooner if needed

## 2012-12-18 NOTE — Assessment & Plan Note (Signed)
Hailey Wilkins describes symptoms consistent with mild intermittent asthma which are exacerbated by allergic rhinitis in the spring time and in the fall when pollen and ragweed are prevalent.  When she is doing well, she rarely if ever needs to use her short acting bronchodilator so therefore most of the time she has mild intermittent asthma. Because her symptoms are exacerbated in the spring, it is reasonable for her to use a steroid inhaler during that time.  Plan: -Continue when necessary albuterol use -About a week before the pollen starts to come out she is to start using Qvar 2 puffs twice a day and continue it through the pollen season. She can discontinue use of the medication after the pollen subsides

## 2012-12-18 NOTE — Progress Notes (Signed)
Subjective:    Patient ID: Hailey Wilkins, female    DOB: 11/10/1971, 43 y.o.   MRN: [id number]  HPI  Hailey Wilkins is a very pleasant 42 year old female who has lifelong asthma who comes her clinic today to establish care for the same. She was diagnosed with asthma in childhood and has been on inhalers since then. She's never been hospitalized for her asthma. She has significant allergy symptoms in that when she was a child she frequently experienced her eyes "swelling shut" and sinus congestion. She also has multiple drug allergies.  She has significant sinus symptoms which is exacerbated by pollen and ragweed. She has had 2 sinus surgeries in the past.  She has been treated with immunotherapy 3 times over the years.  For about 2003 through 2008 she had essentially no asthma symptoms but in 2008 she started using short acting bronchodilators again. Since 2008 she has not used inhaled steroids though she has had bouts of bronchitis which require prednisone.  In December 2013 she had significant shortness of breath, and chest tightness. She had a chest x-ray, and EKG, and a CT angiogram chest which ruled out significant active acute ischemia, and a pulmonary embolism. She was treated for pleurisy and para carditis with high-dose ibuprofen successfully.  Since then she has had no shortness of breath. It sounds like in the last 2-3 years she has had minimal shortness of breath but does occasionally experience some chest tightness which requires albuterol use. She states that in the spring time when her allergic rhinitis gets worse or chest tightness and chest congestion and will develop. This typically ends up in her being treated with prednisone.  She has never smoked cigarettes.  Past Medical History  Diagnosis Date  . History of chicken pox   . Headache   . GERD (gastroesophageal reflux disease)   . Migraines     associated with menses in past, tx with excedrin, may last up to 1 week  . UTI  (lower urinary tract infection)   . Breast cyst 2011    bx benign  . Shingles 04/2011  . Internal hemorrhoid 2006    dx during colon w/Dr Markham Jordan  . Allergic rhinitis     Dr. Bethena Midget, s/p allergy testing  . Basal cell carcinoma of anterior chest     removed  . Asthma   . Depression     after father died, on Pristique in past  . Sinusitis   . Migraines      Family History  Problem Relation Age of Onset  . Hypertension Mother   . Cancer Mother 110    Breast Ca  . Arthritis Mother   . Cancer Father 41    Pancreatic  . Diabetes Father   . Hypertension Father   . Hyperlipidemia Father   . Heart disease Father   . Cancer Maternal Grandmother     Stomach - 90's  . Alcohol abuse Maternal Grandfather   . Alcohol abuse Paternal Grandfather   . Heart disease Paternal Grandfather   . Hyperlipidemia Paternal Grandfather   . Hypertension Paternal Grandfather   . Cancer Sister 43    Thyroid     History   Social History  . Marital Status: Married    Spouse Name: N/A    Number of Children: 2  . Years of Education: N/A   Occupational History  . 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 Smoking status: Never  Smoker   . Smokeless tobacco: Never Used  . Alcohol Use: Yes     Comment: 2 times week/Wine  . Drug Use: No  . Sexually Active: Not on file   Other Topics Concern  . Not on file   Social History Narrative   Lives in [place] with husband and 19YO and 11YO. Works at [employer]. Dog and Cat.Regular Exercise -  GYM, wts, walking, elliptical - 3 times a week x 1 hourDaily Caffeine Use:  3 cups coffee, 1 diet soda     Allergies  Allergen Reactions  . Amoxicillin Anaphylaxis  . Erythromycin Anaphylaxis  . Levofloxacin Anaphylaxis  . Rocephin (Ceftriaxone Sodium In Dextrose) Anaphylaxis  . Shellfish Allergy Anaphylaxis  . Tetracyclines & Related Anaphylaxis  . Amoxicillin-Pot Clavulanate Hives  . Clindamycin/Lincomycin Hives  .  Codeine Hives  . Latex   . Levaquin (Levofloxacin)   . Sulfa Antibiotics Hives     Outpatient Prescriptions Prior to Visit  Medication Sig Dispense Refill  . albuterol (VENTOLIN HFA) 108 (90 BASE) MCG/ACT inhaler Inhale 2 puffs into the lungs every 6 (six) hours as needed. For shortness of breath      . aspirin-acetaminophen-caffeine (EXCEDRIN MIGRAINE) 250-250-65 MG per tablet Take 1 tablet by mouth every 6 (six) hours as needed.      Marland Kitchen b complex vitamins tablet Take 1 tablet by mouth daily.      . cetirizine (ZYRTEC) 10 MG tablet Take 10 mg by mouth daily.      . Cholecalciferol (VITAMIN D) 2000 UNITS tablet Take 2,000 Units by mouth daily.      . fluticasone (FLONASE) 50 MCG/ACT nasal spray Place 2 sprays into the nose 2 (two) times daily.      . metoprolol succinate (TOPROL XL) 25 MG 24 hr tablet Take 1 tablet (25 mg total) by mouth daily.  30 tablet  6  . Multiple Vitamin (MULTIVITAMIN WITH MINERALS) TABS Take 1 tablet by mouth daily.      . nortriptyline (PAMELOR) 25 MG capsule Take 1 capsule (25 mg total) by mouth at bedtime.  30 capsule  6  . valACYclovir (VALTREX) 1000 MG tablet Take 1,000 mg by mouth daily as needed. For fever blister       Last reviewed on 12/18/2012 10:24 AM by Caryl Ada, CMA    Review of Systems  Constitutional: Negative for fever and unexpected weight change.  HENT: Positive for congestion and sinus pressure. Negative for ear pain, nosebleeds, sore throat, rhinorrhea, sneezing, trouble swallowing, dental problem and postnasal drip.   Eyes: Negative for redness and itching.  Respiratory: Positive for cough, shortness of breath and wheezing. Negative for chest tightness.   Cardiovascular: Negative for palpitations and leg swelling.  Gastrointestinal: Negative for nausea and vomiting.  Genitourinary: Negative for dysuria.  Musculoskeletal: Negative for joint swelling.  Skin: Negative for rash.  Neurological: Negative for headaches.  Hematological:  Does not bruise/bleed easily.  Psychiatric/Behavioral: Negative for dysphoric mood. The patient is not nervous/anxious.        Objective:   Physical Exam  Filed Vitals:   12/18/12 1025  BP: 102/74  Pulse: 87  Temp: 98.7 F (37.1 C)  TempSrc: Oral  Height: 5\' 2"  (1.575 m)  Weight: 167 lb (75.751 kg)  SpO2: 100%   Gen: well appearing, no acute distress HEENT: NCAT, PERRL, EOMi, OP clear, neck supple without masses PULM: CTA B CV: RRR, no mgr, no JVD AB: BS+, soft, nontender, no hsm Ext: warm, no edema, no  clubbing, no cyanosis Derm: no rash or skin breakdown Neuro: A&Ox4, CN II-XII intact, strength 5/5 in all 4 extremities  12/18/2012 simple spirometry normal 12/18/2012 asthma control questionnaire 2/7 (consistent with excellent control) December 2013 chest x-ray normal December 2013 CT MG a chest without evidence of pulmonary embolism. There is no clear parenchymal abnormality. Normal cardiac silhouette no significant lymphadenopathy.     Assessment & Plan:   Asthma, mild intermitent Ms. Elsbernd describes symptoms consistent with mild intermittent asthma which are exacerbated by allergic rhinitis in the spring time and in the fall when pollen and ragweed are prevalent.  When she is doing well, she rarely if ever needs to use her short acting bronchodilator so therefore most of the time she has mild intermittent asthma. Because her symptoms are exacerbated in the spring, it is reasonable for her to use a steroid inhaler during that time.  Plan: -Continue when necessary albuterol use -About a week before the pollen starts to come out she is to start using Qvar 2 puffs twice a day and continue it through the pollen season. She can discontinue use of the medication after the pollen subsides   Allergic rhinitis This contributes to her asthma symptoms in the spring.  Plan: -I instructed her to use saline rinses twice a day and 30 minutes before a nasal steroid -Use an  over-the-counter antihistamine such as cetirizine, Claritin, or Allegra.   Updated Medication List Outpatient Encounter Prescriptions as of 12/18/2012  Medication Sig Dispense Refill  . albuterol (VENTOLIN HFA) 108 (90 BASE) MCG/ACT inhaler Inhale 2 puffs into the lungs every 6 (six) hours as needed. For shortness of breath      . aspirin-acetaminophen-caffeine (EXCEDRIN MIGRAINE) 250-250-65 MG per tablet Take 1 tablet by mouth every 6 (six) hours as needed.      Marland Kitchen b complex vitamins tablet Take 1 tablet by mouth daily.      . cetirizine (ZYRTEC) 10 MG tablet Take 10 mg by mouth daily.      . Cholecalciferol (VITAMIN D) 2000 UNITS tablet Take 2,000 Units by mouth daily.      . fluticasone (FLONASE) 50 MCG/ACT nasal spray Place 2 sprays into the nose 2 (two) times daily.      . metoprolol succinate (TOPROL XL) 25 MG 24 hr tablet Take 1 tablet (25 mg total) by mouth daily.  30 tablet  6  . Multiple Vitamin (MULTIVITAMIN WITH MINERALS) TABS Take 1 tablet by mouth daily.      . nortriptyline (PAMELOR) 25 MG capsule Take 1 capsule (25 mg total) by mouth at bedtime.  30 capsule  6  . valACYclovir (VALTREX) 1000 MG tablet Take 1,000 mg by mouth daily as needed. For fever blister      . beclomethasone (QVAR) 40 MCG/ACT inhaler Inhale 2 puffs into the lungs 2 (two) times daily.  1 Inhaler  5

## 2012-12-18 NOTE — Assessment & Plan Note (Signed)
This contributes to her asthma symptoms in the spring.  Plan: -I instructed her to use saline rinses twice a day and 30 minutes before a nasal steroid -Use an over-the-counter antihistamine such as cetirizine, Claritin, or Allegra.

## 2012-12-26 ENCOUNTER — Other Ambulatory Visit: Payer: Self-pay

## 2012-12-26 MED ORDER — METOPROLOL SUCCINATE ER 50 MG PO TB24: 50.0000 mg | ORAL_TABLET | Freq: Every day | ORAL | Status: DC

## 2013-02-04 ENCOUNTER — Encounter: Payer: Self-pay | Admitting: Internal Medicine

## 2013-02-04 MED ORDER — VALACYCLOVIR HCL 1 G PO TABS: 1000.0000 mg | ORAL_TABLET | Freq: Two times a day (BID) | ORAL | Status: DC

## 2013-02-15 ENCOUNTER — Other Ambulatory Visit: Payer: Self-pay | Admitting: Internal Medicine

## 2013-02-25 ENCOUNTER — Ambulatory Visit (INDEPENDENT_AMBULATORY_CARE_PROVIDER_SITE_OTHER): Payer: PRIVATE HEALTH INSURANCE | Admitting: Cardiovascular Disease

## 2013-02-25 ENCOUNTER — Encounter: Payer: Self-pay | Admitting: Cardiovascular Disease

## 2013-02-25 VITALS — BP 102/76 | HR 100 | Ht 62.0 in | Wt 170.5 lb

## 2013-02-25 DIAGNOSIS — I498 Other specified cardiac arrhythmias: Secondary | ICD-10-CM

## 2013-02-25 DIAGNOSIS — R071 Chest pain on breathing: Secondary | ICD-10-CM

## 2013-02-25 DIAGNOSIS — R000 Tachycardia, unspecified: Secondary | ICD-10-CM

## 2013-02-25 MED ORDER — METOPROLOL SUCCINATE ER 50 MG PO TB24: 25.0000 mg | ORAL_TABLET | Freq: Every day | ORAL | Status: DC

## 2013-02-25 NOTE — Assessment & Plan Note (Signed)
No further chest pain or dyspnea.

## 2013-02-25 NOTE — Patient Instructions (Addendum)
Decrease Toprol to 25 mg once daily.  Follow up in 6 months.

## 2013-02-25 NOTE — Progress Notes (Signed)
HPI  This is a 42 year old pleasant female who is here today for a followup visit regarding sinus tachycardia. She was seen a few months ago for substernal heaviness as well as sharp pleuritic pain. This was associated with dyspnea, fatigue and decreased exercise ability. She also had tachycardia and palpitations . She was treated with prednisone for presumed asthma exacerbation with partial improvement in symptoms. However, her symptoms worsened again. She was sent to the emergency room at [facility] on December 20 where she was found to have elevated d-dimer. The rest of her labs were unremarkable. She had a VQ scan done which was low probability for pulmonary embolism. Her symptoms worsened again and she went to the emergency room at [facility] on December 27. She had a CTA of the chest which showed no evidence of pulmonary embolism or other abnormalities.  She underwent a treadmill stress test which showed no evidence of ischemia. Echocardiogram was normal. I felt that her symptoms might have been related to pericarditis. I treated her with NSAIDs. Her chest pain improved. However, she continued to feel palpitations with resting tachycardia. Thus, I added small dose Toprol which subsequently was increased to 50 mg once daily. She started exercising regularly and reports resolution of her cardiac symptoms.  Allergies  Allergen Reactions  . Amoxicillin Anaphylaxis  . Erythromycin Anaphylaxis  . Levofloxacin Anaphylaxis  . Rocephin (Ceftriaxone Sodium In Dextrose) Anaphylaxis  . Shellfish Allergy Anaphylaxis  . Tetracyclines & Related Anaphylaxis  . Amoxicillin-Pot Clavulanate Hives  . Clindamycin/Lincomycin Hives  . Codeine Hives  . Latex   . Levaquin (Levofloxacin)   . Sulfa Antibiotics Hives     Current Outpatient Prescriptions on File Prior to Visit  Medication Sig Dispense Refill  . albuterol (VENTOLIN HFA) 108 (90 BASE) MCG/ACT inhaler Inhale 2 puffs into the lungs every 6 (six) hours  as needed. For shortness of breath      . aspirin-acetaminophen-caffeine (EXCEDRIN MIGRAINE) 250-250-65 MG per tablet Take 1 tablet by mouth every 6 (six) hours as needed.      Marland Kitchen b complex vitamins tablet Take 1 tablet by mouth daily.      . beclomethasone (QVAR) 40 MCG/ACT inhaler Inhale 2 puffs into the lungs 2 (two) times daily.  1 Inhaler  5  . cetirizine (ZYRTEC) 10 MG tablet Take 10 mg by mouth daily.      . Cholecalciferol (VITAMIN D) 2000 UNITS tablet Take 2,000 Units by mouth daily.      . fluticasone (FLONASE) 50 MCG/ACT nasal spray Place 2 sprays into the nose 2 (two) times daily.      . Multiple Vitamin (MULTIVITAMIN WITH MINERALS) TABS Take 1 tablet by mouth daily.      . nortriptyline (PAMELOR) 25 MG capsule TAKE 1 CAPSULE (25 MG TOTAL) BY MOUTH AT BEDTIME.  30 capsule  3  . valACYclovir (VALTREX) 1000 MG tablet Take 1 tablet (1,000 mg total) by mouth 2 (two) times daily. For fever blister  14 tablet  3   No current facility-administered medications on file prior to visit.     Past Medical History  Diagnosis Date  . History of chicken pox   . Headache   . GERD (gastroesophageal reflux disease)   . Migraines     associated with menses in past, tx with excedrin, may last up to 1 week  . UTI (lower urinary tract infection)   . Breast cyst 2011    bx benign  . Shingles 04/2011  . Internal  hemorrhoid 2006    dx during colon w/Dr Markham Jordan  . Allergic rhinitis     Dr. Bethena Midget, s/p allergy testing  . Basal cell carcinoma of anterior chest     removed  . Asthma   . Depression     after father died, on Pristique in past  . Sinusitis   . Migraines      Past Surgical History  Procedure Laterality Date  . Tonsillectomy  1988  . Nasal sinus surgery  1999, 2005  . Knee arthroscopy w/ meniscal repair  1989    Right, Dr. Gerrit Heck  . Basal cell carcinoma excision  2011    removed from chest/[facility]  . Intrauterine device insertion  01/2011    Dr Hyacinth Meeker  . Vaginal  delivery      x 2  . Breast biopsy  2011    At [facility] /Dx Cyst  . Cholecystectomy  2010    [facility]  . Colonoscopy       Family History  Problem Relation Age of Onset  . Hypertension Mother   . Cancer Mother 53    Breast Ca  . Arthritis Mother   . Cancer Father 1    Pancreatic  . Diabetes Father   . Hypertension Father   . Hyperlipidemia Father   . Heart disease Father   . Cancer Maternal Grandmother     Stomach - 90's  . Alcohol abuse Maternal Grandfather   . Alcohol abuse Paternal Grandfather   . Heart disease Paternal Grandfather   . Hyperlipidemia Paternal Grandfather   . Hypertension Paternal Grandfather   . Cancer Sister 84    Thyroid     History   Social History  . Marital Status: Married    Spouse Name: N/A    Number of Children: 2  . Years of Education: N/A   Occupational History  . 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 Smoking status: Never Smoker   . Smokeless tobacco: Never Used  . Alcohol Use: Yes     Comment: 2 times week/Wine  . Drug Use: No  . Sexually Active: Not on file   Other Topics Concern  . Not on file   Social History Narrative   Lives in [place] with husband and 19YO and 11YO. Works at [employer]. Dog and Cat.      Regular Exercise -  GYM, wts, walking, elliptical - 3 times a week x 1 hour   Daily Caffeine Use:  3 cups coffee, 1 diet soda               PHYSICAL EXAM   BP 102/76  Pulse 100  Ht 5\' 2"  (1.575 m)  Wt 170 lb 8 oz (77.338 kg)  BMI 31.18 kg/m2  Constitutional: She is oriented to person, place, and time. She appears well-developed and well-nourished. No distress.  HENT: No nasal discharge.  Head: Normocephalic and atraumatic.  Eyes: Pupils are equal and round. Right eye exhibits no discharge. Left eye exhibits no discharge.  Neck: Normal range of motion. Neck supple. No JVD present. No thyromegaly present.  Cardiovascular: Normal rate, regular  rhythm, normal heart sounds. Exam reveals no gallop and no friction rub. No murmur heard.  Pulmonary/Chest: Effort normal and breath sounds normal. No stridor. No respiratory distress. She has no wheezes. She has no rales. She exhibits no tenderness.  Abdominal: Soft. Bowel sounds are normal. She exhibits no distension. There  is no tenderness. There is no rebound and no guarding.  Musculoskeletal: Normal range of motion. She exhibits no edema and no tenderness.  Neurological: She is alert and oriented to person, place, and time. Coordination normal.  Skin: Skin is warm and dry. No rash noted. She is not diaphoretic. No erythema. No pallor.  Psychiatric: She has a normal mood and affect. Her behavior is normal. Judgment and thought content normal.    EKG: Normal sinus rhythm  WITHIN NORMAL LIMITS  ASSESSMENT AND PLAN

## 2013-02-25 NOTE — Assessment & Plan Note (Signed)
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 Symptoms improved with initiation of an exercise program as well as treatment with metoprolol. She would like to come off the medication if possible. I asked her to decrease the dose to 25 mg once daily for now instead of stopping it due to risk of rebound tachycardia. She is to continue her regular exercise program.

## 2013-06-17 ENCOUNTER — Telehealth: Payer: Self-pay | Admitting: Obstetrics & Gynecology

## 2013-06-17 DIAGNOSIS — T8384XA Pain from genitourinary prosthetic devices, implants and grafts, initial encounter: Secondary | ICD-10-CM

## 2013-06-17 DIAGNOSIS — T8332XA Displacement of intrauterine contraceptive device, initial encounter: Secondary | ICD-10-CM

## 2013-06-17 NOTE — Telephone Encounter (Signed)
Patient wants to schedule an appointment with Dr. Hyacinth Meeker re: IUD string placement please.

## 2013-06-17 NOTE — Telephone Encounter (Signed)
She needs an ultrasound.  Can you schedule that please?

## 2013-06-17 NOTE — Telephone Encounter (Signed)
Patient last AEX 11/30/2012 with Dr. Edward Jolly. In paper chart.  String of IUD not visualized per Dr. Edward Jolly Was advised for pelvic ultrasound. Refused due to some other health issues at the time.  Calling to day to make appt. With Dr. Hyacinth Meeker to have this checked.  Concern also of internal pain on inner wall with SA.  Will call patient back with appt. With Dr. Hyacinth Meeker when confirmed if August 8th @ 11:30 with Dr. Hyacinth Meeker is acceptable .

## 2013-06-19 NOTE — Telephone Encounter (Signed)
Need to schedule PUS to check IUD, not and insertion,

## 2013-06-19 NOTE — Telephone Encounter (Signed)
Patient returning Sally's call. °

## 2013-06-19 NOTE — Telephone Encounter (Signed)
Call to patient to schedule IUD insert. LMTCB

## 2013-06-27 ENCOUNTER — Encounter: Payer: Self-pay | Admitting: Obstetrics & Gynecology

## 2013-06-27 ENCOUNTER — Ambulatory Visit (INDEPENDENT_AMBULATORY_CARE_PROVIDER_SITE_OTHER): Payer: No Typology Code available for payment source | Admitting: Obstetrics & Gynecology

## 2013-06-27 ENCOUNTER — Ambulatory Visit (INDEPENDENT_AMBULATORY_CARE_PROVIDER_SITE_OTHER): Payer: No Typology Code available for payment source

## 2013-06-27 DIAGNOSIS — N83209 Unspecified ovarian cyst, unspecified side: Secondary | ICD-10-CM

## 2013-06-27 DIAGNOSIS — T8389XA Other specified complication of genitourinary prosthetic devices, implants and grafts, initial encounter: Secondary | ICD-10-CM

## 2013-06-27 DIAGNOSIS — T8384XA Pain from genitourinary prosthetic devices, implants and grafts, initial encounter: Secondary | ICD-10-CM

## 2013-06-27 DIAGNOSIS — Z30431 Encounter for routine checking of intrauterine contraceptive device: Secondary | ICD-10-CM

## 2013-06-27 DIAGNOSIS — T8332XA Displacement of intrauterine contraceptive device, initial encounter: Secondary | ICD-10-CM

## 2013-06-27 DIAGNOSIS — T8339XA Other mechanical complication of intrauterine contraceptive device, initial encounter: Secondary | ICD-10-CM

## 2013-06-27 NOTE — Progress Notes (Signed)
42 y.o.Marriedfemale here for a pelvic ultrasound due to inability to see IUD strings at [facility].  Saw Dr. Edward Jolly for this appointment.  Mirena placed 2/12.  Patient having no bleeding or other issues.  She had trouble with headaches last year and she thought some of this might be the IUD but that seems to have leveled out for patient.  No LMP recorded. Patient is not currently having periods (Reason: IUD).  Sexually active:  yes  Contraception: IUD  FINDINGS: UTERUS:  8.9 x 4.8 x 4.1 cm, no fibroids, IUD centrally located.  IUD string 1.9cm from cervical os EMS: 5.4cm ADNEXA:   Left ovary 2.1 x 1.2 x 1.4cm   Right ovary 4.4 x 3.5 x 2.1cm with 3.8cm simple, avascular cyst CUL DE SAC: no free fluid   Images shown to patient.  Findings discussed.  Feel ovarian cyst is benign but should recheck.  Patient having no pain.  She is in agreement  Assessment:  Normally positioned IUD, 3.8cm right ovarian cyst Plan: follow-up PUS 3 months

## 2013-06-27 NOTE — Patient Instructions (Signed)
We will call with ultrasound appointment

## 2013-06-28 ENCOUNTER — Ambulatory Visit: Payer: Self-pay | Admitting: Obstetrics & Gynecology

## 2013-07-03 ENCOUNTER — Other Ambulatory Visit: Payer: Self-pay | Admitting: Internal Medicine

## 2013-07-03 NOTE — Telephone Encounter (Signed)
Last visit 12/13, refill?

## 2013-07-25 ENCOUNTER — Telehealth: Payer: Self-pay | Admitting: Obstetrics & Gynecology

## 2013-07-25 NOTE — Telephone Encounter (Signed)
LMTCB to schedule 3 month f/u pus.

## 2013-08-13 ENCOUNTER — Telehealth: Payer: Self-pay | Admitting: Obstetrics & Gynecology

## 2013-08-13 NOTE — Telephone Encounter (Signed)
LMTCB to schedule 3 month f/u PUS. (2nd call)

## 2013-08-28 ENCOUNTER — Encounter: Payer: Self-pay | Admitting: Cardiovascular Disease

## 2013-08-28 ENCOUNTER — Ambulatory Visit (INDEPENDENT_AMBULATORY_CARE_PROVIDER_SITE_OTHER): Payer: PRIVATE HEALTH INSURANCE | Admitting: Cardiovascular Disease

## 2013-08-28 VITALS — BP 126/80 | HR 84 | Ht 62.0 in | Wt 169.5 lb

## 2013-08-28 DIAGNOSIS — I498 Other specified cardiac arrhythmias: Secondary | ICD-10-CM

## 2013-08-28 DIAGNOSIS — R000 Tachycardia, unspecified: Secondary | ICD-10-CM

## 2013-08-28 NOTE — Assessment & Plan Note (Signed)
This overall has resolved. She continues to be a small dose Toprol 25 mg once daily. She asked if she can come off this medication. Given that she is on a small dose, this can be discontinued. I asked her to continue monitoring her symptoms. If   she develops recurrent palpitations, this can be resumed.

## 2013-08-28 NOTE — Patient Instructions (Signed)
Continue same medications. Can you try to stop Metoprolol for few days and see how you feel.   Your physician wants you to follow-up in: 12 months.  You will receive a reminder letter in the mail two months in advance. If you don't receive a letter, please call our office to schedule the follow-up appointment.

## 2013-08-28 NOTE — Progress Notes (Signed)
HPI  This is a 42 year old pleasant female who is here today for a followup visit regarding inappropriate sinus tachycardia. She did have pleuritic chest pain and dyspnea with negative cardiac evaluation. She underwent a treadmill stress test which showed no evidence of ischemia. Echocardiogram was normal. Symptoms might have been related to pericarditis. She was treated her with NSAIDs. Her chest pain improved. However, she continued to feel palpitations with resting tachycardia. Thus, I added small dose Toprol with significant improvement in symptoms. She has been exercising regularly and feels significantly better.  Allergies  Allergen Reactions  . Amoxicillin Anaphylaxis  . Erythromycin Anaphylaxis  . Levofloxacin Anaphylaxis  . Rocephin [Ceftriaxone Sodium In Dextrose] Anaphylaxis  . Shellfish Allergy Anaphylaxis  . Tetracyclines & Related Anaphylaxis  . Amoxicillin-Pot Clavulanate Hives  . Clindamycin/Lincomycin Hives  . Codeine Hives  . Latex   . Levaquin [Levofloxacin]   . Sulfa Antibiotics Hives     Current Outpatient Prescriptions on File Prior to Visit  Medication Sig Dispense Refill  . albuterol (VENTOLIN HFA) 108 (90 BASE) MCG/ACT inhaler Inhale 2 puffs into the lungs every 6 (six) hours as needed. For shortness of breath      . aspirin-acetaminophen-caffeine (EXCEDRIN MIGRAINE) 250-250-65 MG per tablet Take 1 tablet by mouth every 6 (six) hours as needed.      Marland Kitchen b complex vitamins tablet Take 1 tablet by mouth daily.      . cetirizine (ZYRTEC) 10 MG tablet Take 10 mg by mouth daily.      . Cholecalciferol (VITAMIN D) 2000 UNITS tablet Take 2,000 Units by mouth daily.      . fluticasone (FLONASE) 50 MCG/ACT nasal spray Place 2 sprays into the nose 2 (two) times daily.      . metoprolol succinate (TOPROL-XL) 50 MG 24 hr tablet Take 1 tablet (50 mg total) by mouth daily. Take with or immediately following a meal.  30 tablet  3  . Multiple Vitamin (MULTIVITAMIN WITH  MINERALS) TABS Take 1 tablet by mouth daily.      . nortriptyline (PAMELOR) 25 MG capsule TAKE ONE CAPSULE BY MOUTH AT BEDTIME  30 capsule  3   No current facility-administered medications on file prior to visit.     Past Medical History  Diagnosis Date  . History of chicken pox   . Headache(784.0)   . GERD (gastroesophageal reflux disease)   . Migraines     associated with menses in past, tx with excedrin, may last up to 1 week  . UTI (lower urinary tract infection)   . Breast cyst 2011    bx benign  . Shingles 04/2011  . Internal hemorrhoid 2006    dx during colon w/Dr Markham Jordan  . Allergic rhinitis     Dr. Bethena Midget, s/p allergy testing  . Basal cell carcinoma of anterior chest     removed  . Asthma   . Depression     after father died, on [place] in past  . Sinusitis   . Migraines      Past Surgical History  Procedure Laterality Date  . Tonsillectomy  1988  . Nasal sinus surgery  1999, 2005  . Knee arthroscopy w/ meniscal repair  1989    Right, Dr. Gerrit Heck  . Basal cell carcinoma excision  2011    [place] [place] [facility]  . Intrauterine device insertion  01/2011    Dr Hyacinth Meeker  . Vaginal delivery      x 2  . Breast biopsy  2011    At [facility] /Dx Cyst  . Cholecystectomy  2010    [facility]  . Colonoscopy       Family History  Problem Relation Age of Onset  . Hypertension Mother   . Cancer Mother 18    Breast Ca  . Arthritis Mother   . Cancer Father 63    Pancreatic  . Diabetes Father   . Hypertension Father   . Hyperlipidemia Father   . Heart disease Father   . Cancer Maternal Grandmother     Stomach - 90's  . Alcohol abuse Maternal Grandfather   . Alcohol abuse Paternal Grandfather   . Heart disease Paternal Grandfather   . Hyperlipidemia Paternal Grandfather   . Hypertension Paternal Grandfather   . Cancer Sister 71    Thyroid     History   Social History  . Marital Status: Married    Spouse Name: N/A    Number  of Children: 2  . Years of Education: N/A   Occupational History  . Payroll & Benefits admin - H[employer]     Social History Main Topics  . Smoking status: Never Smoker   . Smokeless tobacco: Never Used  . Alcohol Use: Yes     Comment: 2 times week/Wine  . Drug Use: No  . Sexual Activity: Not on file   Other Topics Concern  . Not on file   Social History Narrative   Lives in [place] with husband and 19YO and 11YO. Works at [employer]. Dog and Cat.      Regular Exercise -  GYM, wts, walking, elliptical - 3 times a week x 1 hour   Daily Caffeine Use:  3 cups coffee, 1 diet soda               PHYSICAL EXAM   BP 126/80  Pulse 84  Ht 5\' 2"  (1.575 m)  Wt 169 lb 8 oz (76.885 kg)  BMI 30.99 kg/m2  Constitutional: She is oriented to person, place, and time. She appears well-developed and well-nourished. No distress.  HENT: No nasal discharge.  Head: Normocephalic and atraumatic.  Eyes: Pupils are equal and round. Right eye exhibits no discharge. Left eye exhibits no discharge.  Neck: Normal range of motion. Neck supple. No JVD present. No thyromegaly present.  Cardiovascular: Normal rate, regular rhythm, normal heart sounds. Exam reveals no gallop and no friction rub. No murmur heard.  Pulmonary/Chest: Effort normal and breath sounds normal. No stridor. No respiratory distress. She has no wheezes. She has no rales. She exhibits no tenderness.  Abdominal: Soft. Bowel sounds are normal. She exhibits no distension. There is no tenderness. There is no rebound and no guarding.  Musculoskeletal: Normal range of motion. She exhibits no edema and no tenderness.  Neurological: She is alert and oriented to person, place, and time. Coordination normal.  Skin: Skin is warm and dry. No rash noted. She is not diaphoretic. No erythema. No pallor.  Psychiatric: She has a normal mood and affect. Her behavior is normal. Judgment and thought content normal.    EKG: Normal sinus rhythm    WITHIN NORMAL LIMITS  ASSESSMENT AND PLAN

## 2013-09-13 ENCOUNTER — Telehealth: Payer: Self-pay | Admitting: Obstetrics & Gynecology

## 2013-09-20 NOTE — Telephone Encounter (Signed)
error 

## 2013-09-24 ENCOUNTER — Encounter: Payer: Self-pay | Admitting: Adult Health

## 2013-09-24 ENCOUNTER — Ambulatory Visit (INDEPENDENT_AMBULATORY_CARE_PROVIDER_SITE_OTHER): Payer: PRIVATE HEALTH INSURANCE | Admitting: Adult Health

## 2013-09-24 VITALS — BP 106/78 | HR 86 | Temp 98.2°F | Resp 12 | Wt 167.0 lb

## 2013-09-24 DIAGNOSIS — M79609 Pain in unspecified limb: Secondary | ICD-10-CM

## 2013-09-24 DIAGNOSIS — M79662 Pain in left lower leg: Secondary | ICD-10-CM

## 2013-09-24 NOTE — Progress Notes (Signed)
  Subjective:    Patient ID: Hailey Wilkins, female    DOB: 1971/06/08, 42 y.o.   MRN: [id number]  HPI  Pt is a pleasant 42 yo female, reports left anterior thigh pain and left posterior calf pain x 1 month. Denies leg swelling, denies acute injury or strain. Pt states she has started running more in the past few months but does not recall any injury when running. Denies SOB, chest pain. Pain is progressively worsening. It is now a constant pain. Worse with activity. She has a Mirena IUD. She does not smoke.   Current Outpatient Prescriptions on File Prior to Visit  Medication Sig Dispense Refill  . albuterol (VENTOLIN HFA) 108 (90 BASE) MCG/ACT inhaler Inhale 2 puffs into the lungs every 6 (six) hours as needed. For shortness of breath      . aspirin-acetaminophen-caffeine (EXCEDRIN MIGRAINE) 250-250-65 MG per tablet Take 1 tablet by mouth every 6 (six) hours as needed.      . beclomethasone (QVAR) 40 MCG/ACT inhaler Inhale 2 puffs into the lungs as needed.      . cetirizine (ZYRTEC) 10 MG tablet Take 10 mg by mouth daily.      . fluticasone (FLONASE) 50 MCG/ACT nasal spray Place 2 sprays into the nose 2 (two) times daily.      . metoprolol succinate (TOPROL-XL) 50 MG 24 hr tablet Take 1 tablet (50 mg total) by mouth daily. Take with or immediately following a meal.  30 tablet  3  . Multiple Vitamin (MULTIVITAMIN WITH MINERALS) TABS Take 1 tablet by mouth daily.      . nortriptyline (PAMELOR) 25 MG capsule TAKE ONE CAPSULE BY MOUTH AT BEDTIME  30 capsule  3  . valACYclovir (VALTREX) 1000 MG tablet Take 1,000 mg by mouth as needed. For fever blister       No current facility-administered medications on file prior to visit.     Review of Systems  Respiratory: Negative for shortness of breath.   Cardiovascular: Negative for chest pain and leg swelling.  Musculoskeletal: Positive for myalgias.       Left anterior thigh pain, left posterior calf pain  Skin: Negative.        Objective:   Physical Exam  Constitutional: She is oriented to person, place, and time. She appears well-developed and well-nourished.  Cardiovascular: Intact distal pulses.   Excellent post tibial and pedal pulses bilaterally.  Musculoskeletal: Normal range of motion. She exhibits no edema.  Neurological: She is alert and oriented to person, place, and time.  Skin: Skin is warm and dry.  Psychiatric: She has a normal mood and affect. Her behavior is normal. Judgment and thought content normal.    BP 106/78  Pulse 86  Temp(Src) 98.2 F (36.8 C) (Oral)  Resp 12  Wt 167 lb (75.751 kg)  SpO2 95%       Assessment & Plan:

## 2013-09-24 NOTE — Progress Notes (Signed)
Pre-visit discussion using our clinic review tool. No additional management support is needed unless otherwise documented below in the visit note.  

## 2013-09-24 NOTE — Assessment & Plan Note (Signed)
Pain in left calf and anterior lower thigh. She is not a smoker. Has progesterone implant. Has been working out. Muscle strain vs DVT. Sending for ultrasound of left leg.

## 2013-09-26 ENCOUNTER — Ambulatory Visit (INDEPENDENT_AMBULATORY_CARE_PROVIDER_SITE_OTHER): Payer: No Typology Code available for payment source | Admitting: Obstetrics & Gynecology

## 2013-09-26 ENCOUNTER — Ambulatory Visit (INDEPENDENT_AMBULATORY_CARE_PROVIDER_SITE_OTHER): Payer: No Typology Code available for payment source

## 2013-09-26 VITALS — BP 100/60 | Wt 168.0 lb

## 2013-09-26 DIAGNOSIS — N83209 Unspecified ovarian cyst, unspecified side: Secondary | ICD-10-CM

## 2013-09-26 NOTE — Progress Notes (Signed)
42 y.o.Marriedfemale here for a pelvic ultrasound to reassess ovary with large cyst.  U/S done 06/27/13 because IUD strings not seen on physical exam.  Pt was having some discomfort on the left side where cyst was seen but nothing significant.  Cyst was 3.8cm and appeared avascular.  No LMP recorded. Patient is not currently having periods (Reason: IUD).  Sexually active:  yes  Contraception: IUD  FINDINGS: UTERUS: 8.3 x 4.2 x 4.0cm with IUD in correct location EMS: 7.57mm ADNEXA:   Left ovary 3.7 x 2.6 x 2.9cm, follicles present but prior cyst resolved.   Right ovary 2.4 x 1.4 x 1.6cm with 4.3cm thin walled cyst CUL DE SAC: no free fluid  Images reviewed with pt.  D/W pt cyst formation much more common in women off OCPs as IUD does not cause ovulation suppression.  She and I discussed not repeat PUS to show that the new right cyst is gone as it wasn't present 3 months ago.  Pt notes a little discomfort on right side now.  She has a good sense of when she is having a cyst given the 2 u/s findings the last few months.  Assessment:  Ovarian cyst, 4.3cm, normally placed IUD Plan: Repeat PUS only if pain worsens.  F/U for AEX 1 year.   ~15 minutes spent with patient >50% of time was in face to face discussion of above.

## 2013-09-27 ENCOUNTER — Other Ambulatory Visit: Payer: Self-pay | Admitting: Internal Medicine

## 2013-09-27 ENCOUNTER — Encounter: Payer: Self-pay | Admitting: Adult Health

## 2013-10-01 MED ORDER — VALACYCLOVIR HCL 1 G PO TABS: 1000.0000 mg | ORAL_TABLET | ORAL | Status: DC | PRN

## 2013-10-03 ENCOUNTER — Encounter: Payer: Self-pay | Admitting: Obstetrics & Gynecology

## 2013-10-03 NOTE — Patient Instructions (Signed)
Please call if you have any new problems/issues 

## 2013-10-07 ENCOUNTER — Telehealth: Payer: Self-pay | Admitting: Internal Medicine

## 2013-10-07 NOTE — Telephone Encounter (Signed)
Left message to call back on work number. Spoke with patient she has taken some Excedrin Migraine and Ibuprofen, seem to have settle it a little bit. In the past she has came in and was given an injection of Toradol and Phenergan. She usually take Amtryptiline daily to help but this one today just would not go away. Informed patient if it is not better or feels like she need to be seen this give [place] a call back in the morning and we will see if we can work into the schedule.

## 2013-10-07 NOTE — Telephone Encounter (Signed)
The patient is wanting a shot for he migraines.

## 2013-10-10 ENCOUNTER — Encounter: Payer: Self-pay | Admitting: Internal Medicine

## 2013-11-08 ENCOUNTER — Other Ambulatory Visit: Payer: Self-pay | Admitting: Internal Medicine

## 2013-11-08 MED ORDER — VALACYCLOVIR HCL 1 G PO TABS: 1000.0000 mg | ORAL_TABLET | ORAL | Status: DC | PRN

## 2013-11-10 ENCOUNTER — Other Ambulatory Visit: Payer: Self-pay | Admitting: Internal Medicine

## 2013-12-02 ENCOUNTER — Ambulatory Visit (INDEPENDENT_AMBULATORY_CARE_PROVIDER_SITE_OTHER): Payer: No Typology Code available for payment source | Admitting: Obstetrics & Gynecology

## 2013-12-02 ENCOUNTER — Encounter: Payer: Self-pay | Admitting: Obstetrics & Gynecology

## 2013-12-02 VITALS — BP 102/64 | HR 72 | Resp 20 | Ht 62.25 in | Wt 167.8 lb

## 2013-12-02 DIAGNOSIS — Z124 Encounter for screening for malignant neoplasm of cervix: Secondary | ICD-10-CM

## 2013-12-02 DIAGNOSIS — Z0000 Encounter for general adult medical examination without abnormal findings: Secondary | ICD-10-CM

## 2013-12-02 DIAGNOSIS — Z01419 Encounter for gynecological examination (general) (routine) without abnormal findings: Secondary | ICD-10-CM

## 2013-12-02 MED ORDER — VALACYCLOVIR HCL 1 G PO TABS: ORAL_TABLET | ORAL | Status: DC

## 2013-12-02 NOTE — Patient Instructions (Signed)

## 2013-12-02 NOTE — Progress Notes (Signed)
43 y.o. G2P2 MarriedCaucasianF here for annual exam.  Going through payroll conversion at work and patient is having a lot of stress related to this.  Mirena placed 2/12.  No vaginal bleeding.   No LMP recorded. Patient is not currently having periods (Reason: IUD).          Sexually active: yes  The current method of family planning is IUD.    Exercising: yes  cardio and weights Smoker:  no  Health Maintenance: Pap:  11/21/11 WNL/negative HR HPV History of abnormal Pap:  Yes years ago-colposcopy/biopsy-normal MMG:  08/15/13 MMG, additional views-normal  Colonoscopy:  2005/06 normal BMD:   none TDaP:  5/09 Screening Labs: today, Hb today: 13.3, Urine today: negative   reports that she has never smoked. She has never used smokeless tobacco. She reports that she drinks about 1.0 ounces of alcohol per week. She reports that she does not use illicit drugs.  Past Medical History  Diagnosis Date  . History of chicken pox   . Headache(784.0)   . GERD (gastroesophageal reflux disease)   . Migraines     associated with menses in past, tx with excedrin, may last up to 1 week  . UTI (lower urinary tract infection)   . Breast cyst 2011    bx benign  . Shingles 04/2011  . Internal hemorrhoid 2006    dx during colon w/Dr Tiffany Kocher  . Allergic rhinitis     Dr. Doreene Nest, s/p allergy testing  . Basal cell carcinoma of anterior chest     removed  . Asthma   . Depression     after father died, on [place] in past  . Sinusitis   . Migraines   . Gestational diabetes     with first child  . Anemia   . Pericarditis 12/13    Past Surgical History  Procedure Laterality Date  . Tonsillectomy  1988  . Nasal sinus surgery  1999, 2005  . Knee arthroscopy w/ meniscal repair  1989    Right, Dr. Mauri Pole  . Basal cell carcinoma excision  2011    removed from chest/[place]  . Intrauterine device insertion  01/2011    Dr Sabra Heck  . Vaginal delivery      x 2  . Breast biopsy  2011    At [facility] /Dx Cyst  . Cholecystectomy  2010    [facility]  . Colonoscopy      Current Outpatient Prescriptions  Medication Sig Dispense Refill  . albuterol (VENTOLIN HFA) 108 (90 BASE) MCG/ACT inhaler Inhale 2 puffs into the lungs every 6 (six) hours as needed. For shortness of breath      . aspirin-acetaminophen-caffeine (EXCEDRIN MIGRAINE) 250-250-65 MG per tablet Take 1 tablet by mouth every 6 (six) hours as needed.      . beclomethasone (QVAR) 40 MCG/ACT inhaler Inhale 2 puffs into the lungs as needed.      . fluticasone (FLONASE) 50 MCG/ACT nasal spray Place 2 sprays into the nose 2 (two) times daily.      . metoprolol succinate (TOPROL-XL) 50 MG 24 hr tablet Take 1 tablet (50 mg total) by mouth daily. Take with or immediately following a meal.  30 tablet  3  . Multiple Vitamin (MULTIVITAMIN WITH MINERALS) TABS Take 1 tablet by mouth daily.      . nortriptyline (PAMELOR) 25 MG capsule TAKE ONE CAPSULE BY MOUTH AT BEDTIME  30 capsule  0  . Probiotic Product (PROBIOTIC DAILY PO)  Take by mouth.      Marland Kitchen Specialty Vitamins Products (ULTRA WOMAN PO) Take by mouth.      . valACYclovir (VALTREX) 1000 MG tablet Take 1 tablet (1,000 mg total) by mouth as needed. For fever blister  14 tablet  6  . cetirizine (ZYRTEC) 10 MG tablet Take 10 mg by mouth daily.       No current facility-administered medications for this visit.    Family History  Problem Relation Age of Onset  . Hypertension Mother   . Cancer Mother 30    Breast Ca  . Arthritis Mother   . Cancer Father 54    Pancreatic  . Diabetes Father   . Hypertension Father   . Hyperlipidemia Father   . Heart disease Father   . Cancer Maternal Grandmother     Stomach - 13's  . Alcohol abuse Maternal Grandfather   . Alcohol abuse Paternal Grandfather   . Heart disease Paternal Grandfather   . Hyperlipidemia Paternal Grandfather   . Hypertension Paternal Grandfather   . Cancer Sister 7    Thyroid  . Other Paternal  Grandmother     brain tumor on pitutary gland    ROS:  Pertinent items are noted in HPI.  Otherwise, a comprehensive ROS was negative.  Exam:   BP 102/64  Pulse 72  Resp 20  Ht 5' 2.25" (1.581 m)  Wt 167 lb 12.8 oz (76.114 kg)  BMI 30.45 kg/m2  Weight change: -1lb  Height: 5' 2.25" (158.1 cm)  Ht Readings from Last 3 Encounters:  12/02/13 5' 2.25" (1.581 m)  08/28/13 5\' 2"  (1.575 m)  02/25/13 5\' 2"  (1.575 m)    General appearance: alert, cooperative and appears stated age Head: Normocephalic, without obvious abnormality, atraumatic Neck: no adenopathy, supple, symmetrical, trachea midline and thyroid normal to inspection and palpation Lungs: clear to auscultation bilaterally Breasts: normal appearance, no masses or tenderness Heart: regular rate and rhythm Abdomen: soft, non-tender; bowel sounds normal; no masses,  no organomegaly Extremities: extremities normal, atraumatic, no cyanosis or edema Skin: Skin color, texture, turgor normal. No rashes or lesions Lymph nodes: Cervical, supraclavicular, and axillary nodes normal. No abnormal inguinal nodes palpated Neurologic: Grossly normal   Pelvic: External genitalia:  no lesions              Urethra:  normal appearing urethra with no masses, tenderness or lesions              Bartholins and Skenes: normal                 Vagina: normal appearing vagina with normal color and discharge, no lesions              Cervix: no lesions              Pap taken: yes Bimanual Exam:  Uterus:  normal size, contour, position, consistency, mobility, non-tender              Adnexa: normal adnexa and no mass, fullness, tenderness               Rectovaginal: Confirms               Anus:  normal sphincter tone, no lesions  A:  Well Woman with normal exam Mirena placed 2/12 Depression/anxiety SUI Mother with hx of breast cancer age 37 H/O facial HSV 1  P:   Mammogram yearly pap smear only today TSH, Vit D, CMP, Lipids rx for valtrex  to  be take 2gram bid for one day to pharmacy return annually or prn  An After Visit Summary was printed and given to the patient.

## 2013-12-03 LAB — COMPREHENSIVE METABOLIC PANEL
ALT: 21 U/L (ref 0–35)
AST: 23 U/L (ref 0–37)
Albumin: 4.6 g/dL (ref 3.5–5.2)
Alkaline Phosphatase: 71 U/L (ref 39–117)
BUN: 12 mg/dL (ref 6–23)
CO2: 28 mEq/L (ref 19–32)
Calcium: 9.7 mg/dL (ref 8.4–10.5)
Chloride: 101 mEq/L (ref 96–112)
Creat: 0.75 mg/dL (ref 0.50–1.10)
Glucose, Bld: 81 mg/dL (ref 70–99)
Potassium: 5.1 mEq/L (ref 3.5–5.3)
Sodium: 138 mEq/L (ref 135–145)
Total Bilirubin: 0.3 mg/dL (ref 0.3–1.2)
Total Protein: 7.2 g/dL (ref 6.0–8.3)

## 2013-12-03 LAB — VITAMIN D 25 HYDROXY (VIT D DEFICIENCY, FRACTURES): Vit D, 25-Hydroxy: 41 ng/mL (ref 30–89)

## 2013-12-03 LAB — LIPID PANEL
Cholesterol: 142 mg/dL (ref 0–200)
HDL: 48 mg/dL (ref >39)
LDL Cholesterol: 77 mg/dL (ref 0–99)
Total CHOL/HDL Ratio: 3 Ratio
Triglycerides: 83 mg/dL (ref <150)
VLDL: 17 mg/dL (ref 0–40)

## 2013-12-03 LAB — HEMOGLOBIN, FINGERSTICK: Hemoglobin, fingerstick: 13.3 g/dL (ref 12.0–16.0)

## 2013-12-03 LAB — TSH: TSH: 2.06 u[IU]/mL (ref 0.350–4.500)

## 2013-12-04 LAB — IPS PAP SMEAR ONLY

## 2013-12-08 ENCOUNTER — Other Ambulatory Visit: Payer: Self-pay | Admitting: Cardiovascular Disease

## 2013-12-08 ENCOUNTER — Other Ambulatory Visit: Payer: Self-pay | Admitting: Internal Medicine

## 2013-12-16 ENCOUNTER — Other Ambulatory Visit: Payer: Self-pay | Admitting: Internal Medicine

## 2013-12-17 ENCOUNTER — Telehealth: Payer: Self-pay | Admitting: Internal Medicine

## 2013-12-17 NOTE — Telephone Encounter (Signed)
See below my chart message is it ok to wait for dr walker or can she see raquel for med refill.  [name] doesn't have opening till Friday.  Patient will be out of meds.  She is taking last one tonight  Appointment Request From: Hailey Wilkins      With Provider: Rica Mast, MD [-Primary Care Physician-]      Preferred Date Range: Any date 12/17/2013 or later      Preferred Times: Any      Reason for visit: Office Visit      Comments:   I was told that I have to schedule an appointment before my nortriptyline can be refilled. I am completely out, taking my last pill last night. I dont know what will happen if I don't have this in my system (any withdrawls??) It is used for headaches. I tried to call the office but could not get through and could not leave a message. Can you schedule an appointment for me please?

## 2013-12-17 NOTE — Telephone Encounter (Signed)
Fwd to Dr. Walker 

## 2013-12-17 NOTE — Telephone Encounter (Signed)
Offered appointment with Hailey Wilkins on Friday.  Pt was going to be out of town on Friday and sent the below my chart message  Please advise pt  Hailey Wilkins, Unfortunately, I will be in [place] all day on Friday and will not be able to come that day. If my dosage is only 25mg  is that a big enough dosage to make me go through "withdrawals"? I know that you are not supposed to stop "cold [place]". Did [facility] ever contact you about the refill last week?

## 2013-12-18 NOTE — Telephone Encounter (Signed)
I have not received any refill notes on this medication? Please refill her medication for 3 months.

## 2013-12-19 MED ORDER — NORTRIPTYLINE HCL 25 MG PO CAPS: ORAL_CAPSULE | ORAL | Status: DC

## 2013-12-19 NOTE — Telephone Encounter (Signed)
Prescription request completed.

## 2013-12-20 ENCOUNTER — Ambulatory Visit: Payer: PRIVATE HEALTH INSURANCE | Admitting: Adult Health

## 2014-07-14 ENCOUNTER — Telehealth: Payer: Self-pay | Admitting: Obstetrics & Gynecology

## 2014-07-14 NOTE — Telephone Encounter (Signed)
Pt's husband calling to pay her account. I can see where she owes $13.95 but I am not sure if it is to our office it is a bad debt. There is a release on file to talk with him.

## 2014-09-01 ENCOUNTER — Ambulatory Visit (INDEPENDENT_AMBULATORY_CARE_PROVIDER_SITE_OTHER): Payer: BC Managed Care – PPO | Admitting: Cardiovascular Disease

## 2014-09-01 ENCOUNTER — Encounter: Payer: Self-pay | Admitting: Cardiovascular Disease

## 2014-09-01 VITALS — BP 102/70 | HR 70 | Ht 62.0 in | Wt 154.8 lb

## 2014-09-01 DIAGNOSIS — R000 Tachycardia, unspecified: Secondary | ICD-10-CM

## 2014-09-01 DIAGNOSIS — R0789 Other chest pain: Secondary | ICD-10-CM

## 2014-09-01 DIAGNOSIS — I471 Supraventricular tachycardia: Secondary | ICD-10-CM

## 2014-09-01 DIAGNOSIS — R002 Palpitations: Secondary | ICD-10-CM

## 2014-09-01 MED ORDER — METOPROLOL SUCCINATE ER 50 MG PO TB24: 50.0000 mg | ORAL_TABLET | Freq: Every day | ORAL | Status: DC

## 2014-09-01 NOTE — Patient Instructions (Signed)
Continue same medications.   Your physician wants you to follow-up in: 1 year.  You will receive a reminder letter in the mail two months in advance. If you don't receive a letter, please call our office to schedule the follow-up appointment.  

## 2014-09-01 NOTE — Assessment & Plan Note (Signed)
She is doing very well on current dose of Toprol with almost complete control her symptoms. Continue same medications with no change. Continue regular exercise and followup in one year or earlier if needed.

## 2014-09-01 NOTE — Progress Notes (Signed)
HPI  This is a 43 year old pleasant female who is here today for a followup visit regarding inappropriate sinus tachycardia. She did have pleuritic chest pain and dyspnea with negative cardiac evaluation in 2013. She underwent a treadmill stress test which showed no evidence of ischemia. Echocardiogram was normal. Symptoms might have been related to pericarditis. She was treated her with NSAIDs. Her chest pain improved. However, she continued to feel palpitations with an appropriate resting tachycardia. Thus, I added small dose Toprol with significant improvement in symptoms. We tried to stop Toprol last year but symptoms worsen quickly. She has been taking 50 mg once daily with stability of symptoms. She is able to exercise on a regular basis. She gets her heart rate up to 170 beats per minute.  Allergies  Allergen Reactions  . Amoxicillin Anaphylaxis  . Erythromycin Anaphylaxis  . Levofloxacin Anaphylaxis  . Rocephin [Ceftriaxone Sodium In Dextrose] Anaphylaxis  . Shellfish Allergy Anaphylaxis  . Tetracyclines & Related Anaphylaxis  . Amoxicillin-Pot Clavulanate Hives  . Clindamycin/Lincomycin Hives  . Codeine Hives  . Latex   . Levaquin [Levofloxacin]   . Sulfa Antibiotics Hives     Current Outpatient Prescriptions on File Prior to Visit  Medication Sig Dispense Refill  . albuterol (VENTOLIN HFA) 108 (90 BASE) MCG/ACT inhaler Inhale 2 puffs into the lungs every 6 (six) hours as needed. For shortness of breath      . aspirin-acetaminophen-caffeine (EXCEDRIN MIGRAINE) 250-250-65 MG per tablet Take 1 tablet by mouth every 6 (six) hours as needed.      . beclomethasone (QVAR) 40 MCG/ACT inhaler Inhale 2 puffs into the lungs as needed.      . cetirizine (ZYRTEC) 10 MG tablet Take 10 mg by mouth daily.      . fluticasone (FLONASE) 50 MCG/ACT nasal spray Place 2 sprays into the nose 2 (two) times daily.      . metoprolol succinate (TOPROL-XL) 50 MG 24 hr tablet Take 1 tablet (50 mg  total) by mouth daily. Take with or immediately following a meal.  30 tablet  3  . Multiple Vitamin (MULTIVITAMIN WITH MINERALS) TABS Take 1 tablet by mouth daily.      . valACYclovir (VALTREX) 1000 MG tablet Take as directed  30 tablet  2   No current facility-administered medications on file prior to visit.     Past Medical History  Diagnosis Date  . History of chicken pox   . Headache(784.0)   . GERD (gastroesophageal reflux disease)   . Migraines     associated with menses in past, tx with excedrin, may last up to 1 week  . UTI (lower urinary tract infection)   . Breast cyst 2011    bx benign  . Shingles 04/2011  . Internal hemorrhoid 2006    dx during colon w/Dr Tiffany Kocher  . Allergic rhinitis     Dr. Doreene Nest, s/p allergy testing  . Basal cell carcinoma of anterior chest     removed  . Asthma   . Depression     after father died, on [place] in past  . Sinusitis   . Migraines   . Gestational diabetes     with first child  . Anemia   . Pericarditis 12/13     Past Surgical History  Procedure Laterality Date  . Tonsillectomy  1988  . Nasal sinus surgery  1999, 2005  . Knee arthroscopy w/ meniscal repair  1989    Right, Dr. Mauri Pole  . Basal cell carcinoma  excision  2011    removed from chest/[facility]  . Intrauterine device insertion  01/2011    Dr Sabra Heck  . Vaginal delivery      x 2  . Breast biopsy  2011    At [facility] /Dx Cyst  . Cholecystectomy  2010    [facility]  . Colonoscopy       Family History  Problem Relation Age of Onset  . Hypertension Mother   . Cancer Mother 39    Breast Ca  . Arthritis Mother   . Cancer Father 67    Pancreatic  . Diabetes Father   . Hypertension Father   . Hyperlipidemia Father   . Heart disease Father   . Cancer Maternal Grandmother     Stomach - 77's  . Alcohol abuse Maternal Grandfather   . Alcohol abuse Paternal Grandfather   . Heart disease Paternal Grandfather   . Hyperlipidemia Paternal  Grandfather   . Hypertension Paternal Grandfather   . Cancer Sister 29    Thyroid  . Other Paternal Grandmother     brain tumor on pitutary gland     History   Social History  . Marital Status: Married    Spouse Name: N/A    Number of Children: 2  . Years of Education: N/A   Occupational History  . 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 Smoking status: Never Smoker   . Smokeless tobacco: Never Used  . Alcohol Use: 1.0 - 1.5 oz/week    2-3 drink(s) per week  . Drug Use: No  . Sexual Activity: Yes    Partners: Male    Birth Control/ Protection: IUD   Other Topics Concern  . Not on file   Social History Narrative   Lives in [place] with husband and [place] and 11YO. Works at [employer]. Dog and Cat.      Regular Exercise -  GYM, wts, walking, elliptical - 3 times a week x 1 hour   Daily Caffeine Use:  3 cups coffee, 1 diet soda               PHYSICAL EXAM   BP 102/70  Pulse 70  Ht 5\' 2"  (1.575 m)  Wt 154 lb 12 oz (70.194 kg)  BMI 28.30 kg/m2  Constitutional: She is oriented to person, place, and time. She appears well-developed and well-nourished. No distress.  HENT: No nasal discharge.  Head: Normocephalic and atraumatic.  Eyes: Pupils are equal and round. Right eye exhibits no discharge. Left eye exhibits no discharge.  Neck: Normal range of motion. Neck supple. No JVD present. No thyromegaly present.  Cardiovascular: Normal rate, regular rhythm, normal heart sounds. Exam reveals no gallop and no friction rub. No murmur heard.  Pulmonary/Chest: Effort normal and breath sounds normal. No stridor. No respiratory distress. She has no wheezes. She has no rales. She exhibits no tenderness.  Abdominal: Soft. Bowel sounds are normal. She exhibits no distension. There is no tenderness. There is no rebound and no guarding.  Musculoskeletal: Normal range of motion. She exhibits no edema and no tenderness.  Neurological: She is  alert and oriented to person, place, and time. Coordination normal.  Skin: Skin is warm and dry. No rash noted. She is not diaphoretic. No erythema. No pallor.  Psychiatric: She has a normal mood and affect. Her behavior is normal. Judgment and thought content normal.    EKG: Sinus  Rhythm  Low voltage in precordial leads.   ABNORMAL    ASSESSMENT AND PLAN

## 2014-09-22 ENCOUNTER — Encounter: Payer: Self-pay | Admitting: Cardiovascular Disease

## 2014-09-30 ENCOUNTER — Telehealth: Payer: Self-pay

## 2014-09-30 ENCOUNTER — Encounter: Payer: Self-pay | Admitting: Certified Nurse Midwife

## 2014-09-30 ENCOUNTER — Ambulatory Visit (INDEPENDENT_AMBULATORY_CARE_PROVIDER_SITE_OTHER): Payer: BC Managed Care – PPO | Admitting: Certified Nurse Midwife

## 2014-09-30 VITALS — BP 110/60 | HR 70 | Resp 16 | Ht 62.25 in | Wt 154.0 lb

## 2014-09-30 DIAGNOSIS — A499 Bacterial infection, unspecified: Secondary | ICD-10-CM

## 2014-09-30 DIAGNOSIS — B3731 Acute candidiasis of vulva and vagina: Secondary | ICD-10-CM

## 2014-09-30 DIAGNOSIS — B373 Candidiasis of vulva and vagina: Secondary | ICD-10-CM

## 2014-09-30 DIAGNOSIS — N760 Acute vaginitis: Secondary | ICD-10-CM

## 2014-09-30 DIAGNOSIS — B9689 Other specified bacterial agents as the cause of diseases classified elsewhere: Secondary | ICD-10-CM

## 2014-09-30 DIAGNOSIS — N898 Other specified noninflammatory disorders of vagina: Secondary | ICD-10-CM

## 2014-09-30 LAB — POCT URINALYSIS DIPSTICK
Bilirubin, UA: NEGATIVE
Blood, UA: NEGATIVE
Glucose, UA: NEGATIVE
Ketones, UA: NEGATIVE
Leukocytes, UA: NEGATIVE
Nitrite, UA: NEGATIVE
Protein, UA: NEGATIVE
Urobilinogen, UA: NEGATIVE
pH, UA: 6

## 2014-09-30 MED ORDER — METRONIDAZOLE 0.75 % VA GEL: VAGINAL | Status: DC

## 2014-09-30 MED ORDER — NYSTATIN 100000 UNIT/GM EX CREA: 1.0000 "application " | TOPICAL_CREAM | Freq: Two times a day (BID) | CUTANEOUS | Status: DC

## 2014-09-30 NOTE — Progress Notes (Signed)
43 y.o. married white female g2p2002 here with complaint of vaginal symptoms of itching, burning, and increase discharge. Describes discharge as clear with odor. Onset of symptoms 10 days ago. Denies new personal products or vaginal dryness. Treated self with Monistat 0ne and with Monistat 3 with some relief. Contraception Mirena IUD without problems. No STD concerns. Urinary symptoms none .   O:Healthy female WDWN Affect: normal, orientation x 3  Exam: Abdomen:non tender Lymph node: no enlargement or tenderness Pelvic exam: External genital: increase red with slight edema with exudate, slight scaling wet prep taken BUS: negative Vagina: grey watery odorous discharge noted. Ph:5.0   ,Wet prep taken Cervix: normal, non tender, IUD string noted Uterus: normal, non tender Adnexa:normal, non tender, no masses or fullness noted   Wet Prep results: positive for clue, positive yeast vulva only   A:BV Yeast vulvitis  P:Discussed findings of BV,Yeast vulvitis and etiology. Discussed Aveeno or baking soda sitz bath for comfort. Avoid moist clothes  for extended period of time. If working out in gym clothes long periods of time change underwear if possible. Olive Oil use for skin protection prior to activity can be used to external skin. Questions addressed Rx: Metrogel see order Rx Nystatin cream see order  Rv prn

## 2014-09-30 NOTE — Telephone Encounter (Signed)
Spoke with patient. Patient states that last month she began to have vaginal swelling, itching, and burning without any discharge. Patient used Monistat 1 with no relief then used Monistat 3 with relief. "I have been fine until a couple of days ago when it came back." Patient is currently having vaginal itching, burning, and swelling. Denies vaginal discharge or odor. Advised patient will need to be seen for evaluation and proper treatment. Patient is agreeable. Appointment scheduled for today at 2:30pm with Regina Eck CNM. Patient is agreeable to date and time.  Routing to provider for final review. Patient agreeable to disposition. Will close encounter

## 2014-09-30 NOTE — Progress Notes (Signed)
Reviewed personally.  M. Suzanne Keighan Amezcua, MD.  

## 2014-09-30 NOTE — Telephone Encounter (Signed)
Pt is calling stating she is having yeast infection symptoms. Pt states she has used 2 round of OTC cream. Pt would like a call back on her cell.

## 2014-09-30 NOTE — Patient Instructions (Signed)
Bacterial Vaginosis Bacterial vaginosis is a vaginal infection that occurs when the normal balance of bacteria in the vagina is disrupted. It results from an overgrowth of certain bacteria. This is the most common vaginal infection in women of childbearing age. Treatment is important to prevent complications, especially in pregnant women, as it can cause a premature delivery. CAUSES  Bacterial vaginosis is caused by an increase in harmful bacteria that are normally present in smaller amounts in the vagina. Several different kinds of bacteria can cause bacterial vaginosis. However, the reason that the condition develops is not fully understood. RISK FACTORS Certain activities or behaviors can put you at an increased risk of developing bacterial vaginosis, including:  Having a new sex partner or multiple sex partners.  Douching.  Using an intrauterine device (IUD) for contraception. Women do not get bacterial vaginosis from toilet seats, bedding, swimming pools, or contact with objects around them. SIGNS AND SYMPTOMS  Some women with bacterial vaginosis have no signs or symptoms. Common symptoms include:  Grey vaginal discharge.  A fishlike odor with discharge, especially after sexual intercourse.  Itching or burning of the vagina and vulva.  Burning or pain with urination. DIAGNOSIS  Your health care provider will take a medical history and examine the vagina for signs of bacterial vaginosis. A sample of vaginal fluid may be taken. Your health care provider will look at this sample under a microscope to check for bacteria and abnormal cells. A vaginal pH test may also be done.  TREATMENT  Bacterial vaginosis may be treated with antibiotic medicines. These may be given in the form of a pill or a vaginal cream. A second round of antibiotics may be prescribed if the condition comes back after treatment.  HOME CARE INSTRUCTIONS   Only take over-the-counter or prescription medicines as  directed by your health care provider.  If antibiotic medicine was prescribed, take it as directed. Make sure you finish it even if you start to feel better.  Do not have sex until treatment is completed.  Tell all sexual partners that you have a vaginal infection. They should see their health care provider and be treated if they have problems, such as a mild rash or itching.  Practice safe sex by using condoms and only having one sex partner. SEEK MEDICAL CARE IF:   Your symptoms are not improving after 3 days of treatment.  You have increased discharge or pain.  You have a fever. MAKE SURE YOU:   Understand these instructions.  Will watch your condition.  Will get help right away if you are not doing well or get worse. FOR MORE INFORMATION  Centers for Disease Control and Prevention, Division of STD Prevention: www.cdc.gov/std American Sexual Health Association (ASHA): www.ashastd.org  Document Released: 11/07/2005 Document Revised: 08/28/2013 Document Reviewed: 06/19/2013 ExitCare Patient Information 2015 [employer]. This information is not intended to replace advice given to you by your health care provider. Make sure you discuss any questions you have with your health care provider.  

## 2014-10-07 ENCOUNTER — Encounter: Payer: Self-pay | Admitting: Certified Nurse Midwife

## 2014-11-13 ENCOUNTER — Other Ambulatory Visit: Payer: Self-pay | Admitting: Obstetrics & Gynecology

## 2014-11-17 NOTE — Telephone Encounter (Signed)
Medication refill request: Valtrex 1000 mg  Last AEX:  12/02/13 with Dr. Sabra Heck  Next AEX: 12/19/14 with Dr. Sabra Heck   Last MMG (if hormonal medication request): N/A Refill authorized: #[id number] rfs, please advise.

## 2014-12-09 ENCOUNTER — Ambulatory Visit (INDEPENDENT_AMBULATORY_CARE_PROVIDER_SITE_OTHER): Payer: BLUE CROSS/BLUE SHIELD | Admitting: Obstetrics & Gynecology

## 2014-12-09 ENCOUNTER — Encounter: Payer: Self-pay | Admitting: Obstetrics & Gynecology

## 2014-12-09 VITALS — BP 110/74 | HR 80 | Resp 14 | Ht 62.0 in | Wt 156.2 lb

## 2014-12-09 DIAGNOSIS — N951 Menopausal and female climacteric states: Secondary | ICD-10-CM

## 2014-12-09 DIAGNOSIS — Z0000 Encounter for general adult medical examination without abnormal findings: Secondary | ICD-10-CM

## 2014-12-09 DIAGNOSIS — Z01419 Encounter for gynecological examination (general) (routine) without abnormal findings: Secondary | ICD-10-CM

## 2014-12-09 LAB — LIPID PANEL
Cholesterol: 128 mg/dL (ref 0–200)
HDL: 48 mg/dL (ref >39)
LDL Cholesterol: 62 mg/dL (ref 0–99)
Total CHOL/HDL Ratio: 2.7 Ratio
Triglycerides: 89 mg/dL (ref <150)
VLDL: 18 mg/dL (ref 0–40)

## 2014-12-09 LAB — POCT URINALYSIS DIPSTICK
Bilirubin, UA: NEGATIVE
Glucose, UA: NEGATIVE
Ketones, UA: NEGATIVE
Nitrite, UA: NEGATIVE
Protein, UA: NEGATIVE
Urobilinogen, UA: NEGATIVE
pH, UA: 5

## 2014-12-09 LAB — COMPREHENSIVE METABOLIC PANEL
ALT: 17 U/L (ref 0–35)
AST: 21 U/L (ref 0–37)
Albumin: 4 g/dL (ref 3.5–5.2)
Alkaline Phosphatase: 65 U/L (ref 39–117)
BUN: 13 mg/dL (ref 6–23)
CO2: 27 mEq/L (ref 19–32)
Calcium: 9.4 mg/dL (ref 8.4–10.5)
Chloride: 104 mEq/L (ref 96–112)
Creat: 0.86 mg/dL (ref 0.50–1.10)
Glucose, Bld: 67 mg/dL — ABNORMAL LOW (ref 70–99)
Potassium: 4.7 mEq/L (ref 3.5–5.3)
Sodium: 138 mEq/L (ref 135–145)
Total Bilirubin: 0.3 mg/dL (ref 0.2–1.2)
Total Protein: 7.1 g/dL (ref 6.0–8.3)

## 2014-12-09 LAB — HEMOGLOBIN, FINGERSTICK: Hemoglobin, fingerstick: 12.8 g/dL (ref 12.0–16.0)

## 2014-12-09 LAB — TSH: TSH: 2.573 u[IU]/mL (ref 0.350–4.500)

## 2014-12-09 MED ORDER — VALACYCLOVIR HCL 1 G PO TABS: ORAL_TABLET | ORAL | Status: DC

## 2014-12-09 NOTE — Progress Notes (Signed)
44 y.o. G2P2 MarriedCaucasianF here for annual exam.  No vaginal bleeding.  Doing well.  Has Mirena IUD.  Pt feeling "hormonal".  Mother went into menopause in her mid 16s.    Patient's last menstrual period was 12/10/1999.          Sexually active: Yes.    The current method of family planning is IUD.    Exercising: Yes.    cardio and weights Smoker:  no  Health Maintenance: Pap:  12/02/13 WNL History of abnormal Pap:  yes MMG: 08/15/13-MMG, 08/20/13-additional views-normal  Colonoscopy:  2005/06-repeat age 32 BMD:   none TDaP:  5/09 Screening Labs: today, Hb today: 12.8, Urine today: WBC-1+, RBC-trace   reports that she has never smoked. She has never used smokeless tobacco. She reports that she drinks about 0.6 - 1.2 oz of alcohol per week. She reports that she does not use illicit drugs.  Past Medical History  Diagnosis Date  . History of chicken pox   . Headache(784.0)   . GERD (gastroesophageal reflux disease)   . Migraines     associated with menses in past, tx with excedrin, may last up to 1 week  . UTI (lower urinary tract infection)   . Breast cyst 2011    bx benign  . Shingles 04/2011  . Internal hemorrhoid 2006    dx during colon w/Dr Tiffany Kocher  . Allergic rhinitis     Dr. Doreene Nest, s/p allergy testing  . Basal cell carcinoma of anterior chest     removed  . Asthma   . Depression     after father died, on [place] in past  . Sinusitis   . Migraines   . Gestational diabetes     with first child  . Anemia   . Pericarditis 12/13    Past Surgical History  Procedure Laterality Date  . Tonsillectomy  1988  . Nasal sinus surgery  1999, 2005  . Knee arthroscopy w/ meniscal repair  1989    Right, Dr. Mauri Pole  . Basal cell carcinoma excision  2011    removed from chest/[place] [facility]  . Intrauterine device insertion  01/2011    Dr Sabra Heck  . Vaginal delivery      x 2  . Breast biopsy  2011    At [facility] /Dx Cyst  . Cholecystectomy  2010    [facility]  . Colonoscopy      Current Outpatient Prescriptions  Medication Sig Dispense Refill  . albuterol (VENTOLIN HFA) 108 (90 BASE) MCG/ACT inhaler Inhale 2 puffs into the lungs every 6 (six) hours as needed. For shortness of breath    . aspirin-acetaminophen-caffeine (EXCEDRIN MIGRAINE) 250-250-65 MG per tablet Take 1 tablet by mouth every 6 (six) hours as needed.    . beclomethasone (QVAR) 40 MCG/ACT inhaler Inhale 2 puffs into the lungs as needed.    . cetirizine (ZYRTEC) 10 MG tablet Take 10 mg by mouth daily.    . Flaxseed, Linseed, (FLAX SEED OIL PO) Take by mouth. Takes 3 tablets daily.    . fluticasone (FLONASE) 50 MCG/ACT nasal spray Place 2 sprays into the nose 2 (two) times daily.    . metoprolol succinate (TOPROL-XL) 50 MG 24 hr tablet Take 1 tablet (50 mg total) by mouth daily. Take with or immediately following a meal. 30 tablet 11  . Multiple Vitamin (MULTIVITAMIN WITH MINERALS) TABS Take 1 tablet by mouth daily.    . valACYclovir (VALTREX) 1000 MG tablet TAKE AS DIRECTED  30 tablet 1   No current facility-administered medications for this visit.    Family History  Problem Relation Age of Onset  . Hypertension Mother   . Cancer Mother 22    Breast Ca  . Arthritis Mother   . Cancer Father 55    Pancreatic  . Diabetes Father   . Hypertension Father   . Hyperlipidemia Father   . Heart disease Father   . Cancer Maternal Grandmother     Stomach - 36's  . Alcohol abuse Maternal Grandfather   . Alcohol abuse Paternal Grandfather   . Heart disease Paternal Grandfather   . Hyperlipidemia Paternal Grandfather   . Hypertension Paternal Grandfather   . Cancer Sister 51    Thyroid  . Other Paternal Grandmother     brain tumor on pitutary gland    ROS:  Pertinent items are noted in HPI.  Otherwise, a comprehensive ROS was negative.  Exam:   BP 110/74 mmHg  Pulse 80  Resp 14  Ht 5\' 2"  (1.575 m)  Wt 156 lb 3.2 oz (70.852 kg)  BMI 28.56 kg/m2  LMP 12/10/1999   Weight change: @WEIGHTCHANGE @ Height:   Height: 5\' 2"  (157.5 cm)  Ht Readings from Last 3 Encounters:  12/09/14 5\' 2"  (1.575 m)  09/30/14 5' 2.25" (1.581 m)  09/01/14 5\' 2"  (1.575 m)    General appearance: alert, cooperative and appears stated age Head: Normocephalic, without obvious abnormality, atraumatic Neck: no adenopathy, supple, symmetrical, trachea midline and thyroid normal to inspection and palpation Lungs: clear to auscultation bilaterally Breasts: normal appearance, no masses or tenderness Heart: regular rate and rhythm Abdomen: soft, non-tender; bowel sounds normal; no masses,  no organomegaly Extremities: extremities normal, atraumatic, no cyanosis or edema Skin: Skin color, texture, turgor normal. No rashes or lesions Lymph nodes: Cervical, supraclavicular, and axillary nodes normal. No abnormal inguinal nodes palpated Neurologic: Grossly normal   Pelvic: External genitalia:  no lesions except for small external fissures              Urethra:  normal appearing urethra with no masses, tenderness or lesions              Bartholins and Skenes: normal                 Vagina: normal appearing vagina with normal color and discharge, no lesions              Cervix: no lesions              Pap taken: No. Bimanual Exam:  Uterus:  normal size, contour, position, consistency, mobility, non-tender              Adnexa: normal adnexa and no mass, fullness, tenderness               Rectovaginal: Confirms               Anus:  normal sphincter tone, no lesions  Chaperone was present for exam.  A:  Well Woman with normal exam Mirena placed 2/12 Depression/anxiety SUI Mother with hx of breast cancer age 3 H/O facial HSV 1  P: Mammogram yearly.  With family hx, pt is doing yearly.   pap smear 2014 was neg.  Neg HR HPV 2012.  Repeat pap and HR HPV next year. TSH, CMP, Lipids FSH, estradiol, testosterone level rx for valtrex to be take 2gram bid for one day to pharmacy  to have on file.  Pt doesn't need right  now.   return annually or prn

## 2014-12-10 LAB — ESTRADIOL: Estradiol: 232.8 pg/mL

## 2014-12-10 LAB — TESTOSTERONE: Testosterone: 31 ng/dL (ref 10–70)

## 2014-12-10 LAB — FOLLICLE STIMULATING HORMONE: FSH: 7.8 m[IU]/mL

## 2014-12-11 ENCOUNTER — Telehealth: Payer: Self-pay

## 2014-12-11 MED ORDER — CITALOPRAM HYDROBROMIDE 20 MG PO TABS: 20.0000 mg | ORAL_TABLET | Freq: Every day | ORAL | Status: DC

## 2014-12-11 NOTE — Addendum Note (Signed)
Addended by: Megan Salon on: 12/11/2014 05:28 PM   Modules accepted: Orders, SmartSet

## 2014-12-11 NOTE — Telephone Encounter (Signed)
Lmtcb//kn 

## 2014-12-11 NOTE — Telephone Encounter (Signed)
-----   Message from Lyman Speller, MD sent at 12/10/2014  9:58 AM EST ----- Please inform pt that TSH, CMP, lipids are normal  Hormonal testing is normal.  Not close to menopause by these test results.  Does she want to start a low dose citalopram or something similar for mood issues?  I can do rx electronically.  I would want to see her in 6 weeks or so if we start something to make sure it is helping.  Thanks.

## 2014-12-18 NOTE — Telephone Encounter (Signed)
Patient notified of all results-see result note.//kn

## 2014-12-19 ENCOUNTER — Ambulatory Visit: Payer: No Typology Code available for payment source | Admitting: Obstetrics & Gynecology

## 2015-01-03 ENCOUNTER — Other Ambulatory Visit: Payer: Self-pay | Admitting: Cardiovascular Disease

## 2015-01-19 ENCOUNTER — Ambulatory Visit (INDEPENDENT_AMBULATORY_CARE_PROVIDER_SITE_OTHER): Payer: BLUE CROSS/BLUE SHIELD | Admitting: Obstetrics & Gynecology

## 2015-01-19 ENCOUNTER — Encounter: Payer: Self-pay | Admitting: Obstetrics & Gynecology

## 2015-01-19 VITALS — BP 116/68 | HR 74 | Ht 62.0 in | Wt 153.8 lb

## 2015-01-19 DIAGNOSIS — R4586 Emotional lability: Secondary | ICD-10-CM | POA: Diagnosis not present

## 2015-01-19 DIAGNOSIS — R0689 Other abnormalities of breathing: Secondary | ICD-10-CM | POA: Diagnosis not present

## 2015-01-19 MED ORDER — SERTRALINE HCL 50 MG PO TABS: 50.0000 mg | ORAL_TABLET | Freq: Every day | ORAL | Status: DC

## 2015-01-19 NOTE — Progress Notes (Signed)
Patient ID: Hailey Wilkins, female   DOB: [birth year], 44 y.o.   MRN: [id number]  44 yo G2P2 MWF here for follow-up after starting citalopram for emotional lability that was discussed at her AEX 12/09/14.  Pt reported then that she really thought she was going through menopause.  Hormonal testing was negative at that time for menopause.  Pt reports the citalopram has really helped and she feels really good--more like her old self.  She is also not "flying off the handle" like she used to.  She is more tolerant of people and situations.  However, she is having increased yawing that she feels like she can't control.  As well, she has some mild increased fatigue.  Reviewed product side effect profile while pt in office and yawning is present.  Reviewed all other SSRIs while she is here as well.  This is a side effect for several others.  Advised a switch, just to see if could resolve this issue and she is in agreement.  Will plan to switch to Zoloft.  Side effects reviewed as well.  Pt does report a little nausea at beginning of citalopram use but this resolved.  Advised this will likely not return with switch.  Also reports no sexual side effects, as well.  Assessment:  Emotional lability Yawning with Celexa  Plan:  Switch to Zoloft 50mg  daily.  Pt will give update in 2-4 weeks and if continuing to do well, will just stay on dosage.  Advised to call if symptoms seem to return as can increase dosage.  As well, advised to call if decides to stop medication.  ~15 minutes spent with patient >50% of time was in face to face discussion of above.

## 2015-01-23 ENCOUNTER — Encounter: Payer: Self-pay | Admitting: Obstetrics & Gynecology

## 2015-01-23 DIAGNOSIS — R4586 Emotional lability: Secondary | ICD-10-CM | POA: Insufficient documentation

## 2015-01-31 ENCOUNTER — Other Ambulatory Visit: Payer: Self-pay | Admitting: Obstetrics & Gynecology

## 2015-02-02 NOTE — Telephone Encounter (Signed)
Medication refill request: Celexa 20 mg Last AEX:  12/09/14 SM Next AEX: 02/11/16 SM Last MMG (if hormonal medication request): 08/20/13 Diagnostic left BIRADS2:Benign  Refill authorized:  "Plan: Switch to Zoloft 50mg  daily" - SM 01/19/15

## 2015-02-06 ENCOUNTER — Encounter: Payer: Self-pay | Admitting: Obstetrics & Gynecology

## 2015-02-06 DIAGNOSIS — Z1239 Encounter for other screening for malignant neoplasm of breast: Secondary | ICD-10-CM

## 2015-02-06 NOTE — Telephone Encounter (Signed)
Order for screening mammogram sent.  Routing to provider for final review. Patient agreeable to disposition. Will close encounter

## 2015-03-04 ENCOUNTER — Ambulatory Visit
Admit: 2015-03-04 | Disposition: A | Discharge status: Home / Self Care | Payer: Self-pay | Attending: Obstetrics & Gynecology | Admitting: Obstetrics & Gynecology

## 2015-03-09 ENCOUNTER — Encounter: Payer: Self-pay | Admitting: Obstetrics & Gynecology

## 2015-03-10 NOTE — Consult Note (Signed)
PATIENT NAME:  Hailey Wilkins, Hailey Wilkins MR#:  [id number] DATE OF BIRTH:  April 24, 1971  DATE OF CONSULTATION:  11/10/2012  REFERRING PHYSICIAN:  Ronette Deter, MD CONSULTING PHYSICIAN:  Samson Frederic, DO  CHIEF COMPLAINT: Chest discomfort.   HISTORY OF PRESENT ILLNESS:  A 44 year old female with history of asthma, reflux esophagitis, presents with chest tightness of 4 days' duration. She notes that 4 days ago she presented with constant chest tightness. She was evaluated by her PCP and was sent for a d-dimer which was slightly elevated to 0.47. She also had a negative chest x-ray done in the outpatient setting. She was empirically started on steroids to treat possible asthma exacerbation. However, due to the elevated d-dimer, she presented to the Emergency Department. In the ED thus, far she has had a V/Q scan that was negative. CTA was unable to be performed as she has an allergy to shellfish and, so far, troponins are negative x2 sets. We are called to evaluate for admission for chest pain rule out. The patient's chest tightness was relieved by nitroglycerin in the Emergency Department.  Chest tightness was nonradiating; did not have any associated nausea, vomiting, diaphoresis.    In terms of the patient's cardiac risk factors, I do not have access to lipid panel, however, she does not smoke. She is not hypertensive. Her only risk factor is a family history of coronary artery disease in her father, who had an MI at age 29 and subsequently required angioplasty and stenting.   PAST MEDICAL HISTORY: 1.  Internal hemorrhoids.  2.  Asthma.  3.  Reflux esophagitis.  4.  Migraines.  5.  Sinusitis.    PAST SURGICAL HISTORY: 1.  Sinus surgery x2.  2.  Colonoscopy and EGD.  3.  Cholecystectomy.  4.  Tonsillectomy.  5.  Knee surgery.   MEDICATIONS: 1.  Prednisone. This was a prednisone taper that had just been started December 19th.  2.  Nortriptyline 10 mg at bedtime for her migraines.  3.  Zyrtec 10  mg daily.  4.  Ventolin inhaler 2 puffs every 4 hours as needed for shortness of breath.  5.  Flonase 50 mcg intranasal spray twice a day.  ALLERGIES: THE PATIENT HAS MULTIPLE ALLERGIES AS FOLLOWS: AUGMENTIN CAUSES ANAPHYLAXIS, PENICILLIN CAUSES ANAPHYLAXIS, SHELLFISH CAUSES ANAPHYLAXIS, CLINDAMYCIN CAUSES HIVES AND ITCHING, CODEINE CAUSES HIVES, LATEX CAUSES SWELLING. LEVAQUIN, ROCEPHIN, ERYTHROMYCIN, SULFA, AND TETRACYCLINE.   FAMILY HISTORY: The patient's father had first MI at age 55. He required multiple angioplasties subsequently and then finally had stents placed. He also had a history of diabetes mellitus and pancreatic cancer which ultimately led to his demise. Mother had breast cancer but is living. She also has a condition that involves having a blood clot in her eyes as well as significantly elevated blood pressures within her eye for which she gets shot.   SOCIAL HISTORY: The patient is married. She denies tobacco. She drinks a glass or 2 of wine weekly. 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    REVIEW OF SYSTEMS: CONSTITUTIONAL: Denies fever, fatigue, weakness.  EYES: Denies blurred or double vision.  ENT:  Denies tinnitus or ear pain.  RESPIRATORY: Denies cough, wheezing. She does admit to dyspnea and some dyspnea on exertion.  CARDIOVASCULAR: Admits to chest tightness. Denies palpitations, radiation of the chest pain.  GASTROINTESTINAL: Denies nausea, vomiting, diarrhea, abdominal pain, melena, rectal bleeding.  GENITOURINARY: Denies dysuria, frequency.  ENDOCRINE: Denies increased sweating or heat or cold intolerance.  INTEGUMENTARY: Denies any  new skin rashes.  MUSCULOSKELETAL: Denies any myalgias, arthralgias or joint aches.  NEUROLOGICAL: Denies numbness, tingling. She does have history of migraine headaches.  PSYCH: Denies history of anxiety or depression.   PHYSICAL EXAMINATION: VITAL SIGNS: Temp 98.4, pulse 88. Initial blood pressure on presentation was 151/92,  however, at the time of my evaluation blood pressure was 101/63. Heart rate was 88, respirations 16, sating 97% on room air.  GENERAL: Caucasian female in no apparent distress. Nontoxic appearing.  PSYCH: The patient is awake, alert, oriented x3. Judgment and insight intact.  EYES: Pupils are equally round and reactive to light and accommodation. Anicteric sclerae.  ENT: Normal external ears and nares. Posterior pharynx is clear.  CARDIOVASCULAR: S1, S2, regular rate and rhythm. No murmurs appreciated. On telemetry, sinus rhythm is noted. No pretibial edema.  RESPIRATORY: Clear to auscultation bilaterally. No wheezes, rales or rhonchi.  ABDOMEN: Soft, nontender, nondistended. No hepatomegaly.  SKIN: Warm and dry. No rashes appreciated.  MUSCULOSKELETAL: Full range of motion in bilateral upper and lower extremities. No clubbing or cyanosis.   LABORATORY: D-dimer 0.47 which normal ranges between 0 to 0.45 mcg. CK 76, CK-MB 0.7. Troponin is less than 0.02, repeat troponin is less than 0.02 with CK of 73 and CK-MB of 0.9. Glucose 106, BUN 13, creatinine 0.82, sodium 143, potassium 4, chloride 106, bicarbonate 27, calcium 9, anion gap 10, osmolality 286, GFR is greater than 60. Urine pregnancy test is negative.   EKG shows normal sinus rhythm at 79 beats per minute.   ASSESSMENT AND PLAN: A 44 year old female with family history for coronary artery disease, presenting with 4 days' duration of continuous chest tightness.  1.  Chest tightness. The patient has 1 risk factor for coronary artery disease, which is her family history. This puts her estimated Framingham risk score for 10-year cardiac morbidity to be less than 10% over 10 years. I discussed with the patient and her husband that, given this finding, she is a low risk for coronary artery disease and I recommended outpatient stress test and cardiology evaluation. I engaged the patient and husband in conversation and they were agreeable with the plan.  They will follow up with their PCP on Monday and plan for outpatient stress test. In the interim, she can continue daily aspirin therapy as her symptoms improved at this point with it. Acute myocardial infarction has been ruled out. Pulmonary embolism has been ruled out. She will continue steroids as prescribed by her PCP.   DISPOSITION: The patient is being discharged to home with outpatient followup.     TIME SPENT: In evaluation, discussion with the patient and her husband, as well as discussing plan with Dr. Owens Shark, the referring Emergency Department physician, 40 minutes.      ____________________________ Samson Frederic, DO aeo:cs D: 11/10/2012 04:45:42 ET T: 11/11/2012 15:42:13 ET JOB#: [id number]  cc: Samson Frederic, DO, <Dictator> Audry Kauzlarich E Aundria Bitterman DO ELECTRONICALLY SIGNED 11/19/2012 12:35

## 2015-03-17 ENCOUNTER — Telehealth: Payer: Self-pay | Admitting: Emergency Medicine

## 2015-03-17 NOTE — Telephone Encounter (Signed)
Message left to return call to [place] at [phone].    

## 2015-03-17 NOTE — Telephone Encounter (Signed)
Message left to return call to [facility] at [phone] to schedule an appointment with Dr. Sabra Heck for consult for oral treatment. Multiple emails from [place] regarding medication changes.

## 2015-03-17 NOTE — Telephone Encounter (Signed)
Patient scheduled for medication consult with Dr. Sabra Heck 03/23/15 for 1045 and is agreeable. Routing to provider for final review. Patient agreeable to disposition. Will close encounter

## 2015-03-17 NOTE — Telephone Encounter (Signed)
Message left to return call to [place] at [phone].    

## 2015-03-17 NOTE — Telephone Encounter (Signed)
Pt returning call to [facility]

## 2015-03-23 ENCOUNTER — Ambulatory Visit (INDEPENDENT_AMBULATORY_CARE_PROVIDER_SITE_OTHER): Payer: BLUE CROSS/BLUE SHIELD | Admitting: Obstetrics & Gynecology

## 2015-03-23 VITALS — BP 112/62 | HR 68 | Resp 16 | Wt 159.6 lb

## 2015-03-23 DIAGNOSIS — F411 Generalized anxiety disorder: Secondary | ICD-10-CM

## 2015-03-23 DIAGNOSIS — R4586 Emotional lability: Secondary | ICD-10-CM

## 2015-03-23 MED ORDER — FLUOXETINE HCL 10 MG PO TABS: 20.0000 mg | ORAL_TABLET | Freq: Every day | ORAL | Status: DC

## 2015-03-23 NOTE — Progress Notes (Deleted)
    Citalopram--felt better but had yawning Zoloft--now having increased appetite.  Has gained 6 pounds since starting but doesn't feel as good as far energy and clearer decision making.  Was having some issues making "simple" decisions and she would just ponder over them again and again.  Felt like she wasn't focusing very clearly.  Has been on Prestiq in the past.  Had significant withdrawal symptoms      Plan:  Prozac 20mg  daily.  #30/2RF

## 2015-03-29 ENCOUNTER — Encounter: Payer: Self-pay | Admitting: Obstetrics & Gynecology

## 2015-03-29 NOTE — Progress Notes (Signed)
Patient ID: Hailey Wilkins, female   DOB: 05/19/1971, 44 y.o.   MRN: [id number]  44 yo G2P2 MWF here for follow up of emotional lability and anxiety.  pt initially started on Citalopram after she was seen for her AEX 12/09/14.  The Citalopram did help her symptoms but she had increased yawning which was very bothersome to Hailey Wilkins.  When pt was seen in follow up on 01/19/15, we discussed this side effect.  I confirmed this is a side effect on Up To Date.  Pt was switched to Zoloft.  She feels the Citalopram helped symptoms a little better but the yawning has resolved.  Unfortunately, she feels the Zoloft has increased her appetite and she has gained 6 pounds since started.  She also thinks her energy is not as good and she feels her decision making isn't as clear.  For example, she feel increased difficulty making "simple" decisions, like she would just ponder over them again and again. She felt like she wasn't focusing very clearly.   Pt has been on an SNRI in the past--Prestiq.  Did well on it but absolutely hated the withdrawal symptoms when she stopped it.  Does not want to be on it again or anything like it.    Assessment:  Emotional lability and anxiety  Plan: As pt has significant side effects with Prestiq, feel should try another SSRI.  Pt and I reviewed options.  Will try Prozac 20mg  daily. #30/2RF.  Pt will transition straight to this today or tomorrow.  Pt to give follow-up report in 1 month.    ~15 minutes spent with patient >50% of time was in face to face discussion of above.

## 2015-04-14 ENCOUNTER — Other Ambulatory Visit: Payer: Self-pay | Admitting: Obstetrics & Gynecology

## 2015-04-14 NOTE — Telephone Encounter (Addendum)
Patient was switched to Prozac 20 mg 03/23/15 rx denied.

## 2015-05-01 ENCOUNTER — Encounter: Payer: Self-pay | Admitting: Obstetrics & Gynecology

## 2015-05-01 ENCOUNTER — Other Ambulatory Visit: Payer: Self-pay | Admitting: Obstetrics & Gynecology

## 2015-05-03 MED ORDER — FLUOXETINE HCL 20 MG PO TABS: 20.0000 mg | ORAL_TABLET | Freq: Every day | ORAL | Status: DC

## 2015-09-18 ENCOUNTER — Ambulatory Visit (INDEPENDENT_AMBULATORY_CARE_PROVIDER_SITE_OTHER): Payer: BLUE CROSS/BLUE SHIELD | Admitting: Cardiovascular Disease

## 2015-09-18 ENCOUNTER — Encounter: Payer: Self-pay | Admitting: Cardiovascular Disease

## 2015-09-18 ENCOUNTER — Ambulatory Visit: Payer: Self-pay | Admitting: Cardiovascular Disease

## 2015-09-18 VITALS — BP 92/62 | HR 68 | Ht 62.0 in | Wt 165.0 lb

## 2015-09-18 DIAGNOSIS — R000 Tachycardia, unspecified: Secondary | ICD-10-CM

## 2015-09-18 NOTE — Progress Notes (Signed)
HPI  This is a 44 year old pleasant female who is here today for a followup visit regarding inappropriate sinus tachycardia. She did have pleuritic chest pain and dyspnea with negative cardiac evaluation in 2013. She underwent a treadmill stress test which showed no evidence of ischemia. Echocardiogram was normal. Symptoms might have been related to pericarditis. She was treated her with NSAIDs. Her chest pain improved. However, she continued to feel palpitations with an appropriate resting tachycardia. Thus, I added Toprol with significant improvement in symptoms. She has been doing very well with no chest pain, shortness of breath or palpitations. Her resting heart rate is usually in the 70s which goes up to 140 bpm with exercise. She has been doing more exercise than before.   Allergies  Allergen Reactions  . Amoxicillin Anaphylaxis  . Erythromycin Anaphylaxis  . Levofloxacin Anaphylaxis  . Rocephin [Ceftriaxone Sodium In Dextrose] Anaphylaxis  . Shellfish Allergy Anaphylaxis  . Tetracyclines & Related Anaphylaxis  . Amoxicillin-Pot Clavulanate Hives  . Clindamycin/Lincomycin Hives  . Codeine Hives  . Latex   . Levaquin [Levofloxacin]   . Sulfa Antibiotics Hives     Current Outpatient Prescriptions on File Prior to Visit  Medication Sig Dispense Refill  . albuterol (VENTOLIN HFA) 108 (90 BASE) MCG/ACT inhaler Inhale 2 puffs into the lungs every 6 (six) hours as needed. For shortness of breath    . aspirin-acetaminophen-caffeine (EXCEDRIN MIGRAINE) 250-250-65 MG per tablet Take 1 tablet by mouth every 6 (six) hours as needed.    . cetirizine (ZYRTEC) 10 MG tablet Take 10 mg by mouth daily.    Marland Kitchen FLUoxetine (PROZAC) 20 MG tablet Take 1 tablet (20 mg total) by mouth daily. 30 tablet 12  . fluticasone (FLONASE) 50 MCG/ACT nasal spray Place 2 sprays into the nose as needed.     . metoprolol succinate (TOPROL-XL) 50 MG 24 hr tablet TAKE 1 TABLET (50 MG TOTAL) BY MOUTH DAILY. TAKE WITH  OR IMMEDIATELY FOLLOWING A MEAL. 90 tablet 3  . Multiple Vitamin (MULTIVITAMIN WITH MINERALS) TABS Take 1 tablet by mouth daily.    . SOOLANTRA 1 % CREA as needed. Has not been filled at this time  3  . valACYclovir (VALTREX) 1000 MG tablet TAKE AS DIRECTED 30 tablet 1   No current facility-administered medications on file prior to visit.     Past Medical History  Diagnosis Date  . History of chicken pox   . Headache(784.0)   . GERD (gastroesophageal reflux disease)   . Migraines     associated with menses in past, tx with excedrin, may last up to 1 week  . UTI (lower urinary tract infection)   . Breast cyst 2011    bx benign  . Shingles 04/2011  . Internal hemorrhoid 2006    dx during colon w/Dr Tiffany Kocher  . Allergic rhinitis     Dr. Doreene Nest, s/p allergy testing  . Basal cell carcinoma of anterior chest     removed  . Asthma   . Depression     after father died, on [place] in past  . Sinusitis   . Migraines   . Gestational diabetes     with first child  . Anemia   . Pericarditis 12/13     Past Surgical History  Procedure Laterality Date  . Tonsillectomy  1988  . Nasal sinus surgery  1999, 2005  . Knee arthroscopy w/ meniscal repair  1989    Right, Dr. Mauri Pole  . Basal cell carcinoma excision  2011    removed from chest/[facility]  . Intrauterine device insertion  01/2011    Dr Sabra Heck  . Vaginal delivery      x 2  . Breast biopsy  2011    At [facility] /Dx Cyst  . Cholecystectomy  2010    [facility]  . Colonoscopy       Family History  Problem Relation Age of Onset  . Hypertension Mother   . Cancer Mother 30    Breast Ca  . Arthritis Mother   . Cancer Father 55    Pancreatic  . Diabetes Father   . Hypertension Father   . Hyperlipidemia Father   . Heart disease Father   . Cancer Maternal Grandmother     Stomach - 20's  . Alcohol abuse Maternal Grandfather   . Alcohol abuse Paternal Grandfather   . Heart disease Paternal  Grandfather   . Hyperlipidemia Paternal Grandfather   . Hypertension Paternal Grandfather   . Cancer Sister 29    Thyroid  . Other Paternal Grandmother     brain tumor on pitutary gland     Social History   Social History  . Marital Status: Married    Spouse Name: N/A  . Number of Children: 2  . Years of Education: N/A   Occupational History  . 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 Smoking status: Never Smoker   . Smokeless tobacco: Never Used  . Alcohol Use: No  . Drug Use: No  . Sexual Activity:    Partners: Male    Birth Control/ Protection: IUD   Other Topics Concern  . Not on file   Social History Narrative   Lives in [place] with husband and [place] and 11YO. Works at [employer]. Dog and Cat.      Regular Exercise -  GYM, wts, walking, elliptical - 3 times a week x 1 hour   Daily Caffeine Use:  3 cups coffee, 1 diet soda               PHYSICAL EXAM   BP 92/62 mmHg  Pulse 68  Ht 5\' 2"  (1.575 m)  Wt 165 lb (74.844 kg)  BMI 30.17 kg/m2  Constitutional: She is oriented to person, place, and time. She appears well-developed and well-nourished. No distress.  HENT: No nasal discharge.  Head: Normocephalic and atraumatic.  Eyes: Pupils are equal and round. Right eye exhibits no discharge. Left eye exhibits no discharge.  Neck: Normal range of motion. Neck supple. No JVD present. No thyromegaly present.  Cardiovascular: Normal rate, regular rhythm, normal heart sounds. Exam reveals no gallop and no friction rub. No murmur heard.  Pulmonary/Chest: Effort normal and breath sounds normal. No stridor. No respiratory distress. She has no wheezes. She has no rales. She exhibits no tenderness.  Abdominal: Soft. Bowel sounds are normal. She exhibits no distension. There is no tenderness. There is no rebound and no guarding.  Musculoskeletal: Normal range of motion. She exhibits no edema and no tenderness.  Neurological: She is  alert and oriented to person, place, and time. Coordination normal.  Skin: Skin is warm and dry. No rash noted. She is not diaphoretic. No erythema. No pallor.  Psychiatric: She has a normal mood and affect. Her behavior is normal. Judgment and thought content normal.    EKG: Normal sinus rhythm.   ASSESSMENT AND PLAN

## 2015-09-18 NOTE — Patient Instructions (Signed)
Medication Instructions: Continue same medications.   Labwork: None.   Procedures/Testing: None.   Follow-Up: 1 year with Dr. Arida  Any Additional Special Instructions Will Be Listed Below (If Applicable).   

## 2015-09-18 NOTE — Assessment & Plan Note (Signed)
She is doing extremely well on current dose of Toprol. When she attempted to decrease the dose or stop the medication, she had recurrent tachycardia and did not feel well. Thus, I recommend continuing same dose and continue with exercise. Follow-up with me in a yearly basis or earlier if needed.

## 2015-10-21 ENCOUNTER — Ambulatory Visit: Payer: Self-pay | Admitting: Physician Assistant

## 2015-10-28 ENCOUNTER — Other Ambulatory Visit: Payer: Self-pay | Admitting: Obstetrics & Gynecology

## 2015-10-28 NOTE — Telephone Encounter (Signed)
Medication refill request: Valtrex  Last AEX:  12-09-14  Next AEX: 02-11-16  Last MMG (if hormonal medication request): 03-25-15 WNL Refill authorized: please advise

## 2015-10-28 NOTE — Telephone Encounter (Signed)
Medication refill request: Valacyclovir Last AEX:  12/09/14 MM Next AEX: 02/11/16 MM Last MMG (if hormonal medication request): 03/04/15 BI-RADS Category 1:Negative Refill authorized: 12/09/14 #30 tablets.1R. Please advise

## 2015-11-03 ENCOUNTER — Encounter: Payer: Self-pay | Admitting: Internal Medicine

## 2015-11-04 LAB — CBC AND DIFFERENTIAL
HCT: 39 % (ref 36–46)
Hemoglobin: 13.1 g/dL (ref 12.0–16.0)
Neutrophils Absolute: 4 /uL
Platelets: 337 10*3/uL (ref 150–399)
WBC: 6.3 10^3/mL

## 2015-11-04 LAB — HEPATIC FUNCTION PANEL
ALT: 17 U/L (ref 7–35)
AST: 22 U/L (ref 13–35)
Alkaline Phosphatase: 66 U/L (ref 25–125)
Bilirubin, Total: 0.4 mg/dL

## 2015-11-04 LAB — BASIC METABOLIC PANEL
BUN: 8 mg/dL (ref 4–21)
Creatinine: 0.7 mg/dL (ref 0.5–1.1)
Glucose: 91 mg/dL
Potassium: 4.5 mmol/L (ref 3.4–5.3)
Sodium: 142 mmol/L (ref 137–147)

## 2015-11-04 LAB — LIPID PANEL
Cholesterol: 132 mg/dL (ref 0–200)
HDL: 51 mg/dL (ref 35–70)
LDL Cholesterol: 67 mg/dL
Triglycerides: 68 mg/dL (ref 40–160)

## 2015-11-04 LAB — TSH: TSH: 2.45 u[IU]/mL (ref 0.41–5.90)

## 2015-11-09 ENCOUNTER — Encounter: Payer: Self-pay | Admitting: Internal Medicine

## 2015-11-22 HISTORY — PX: REDUCTION MAMMAPLASTY: SUR839

## 2015-12-31 ENCOUNTER — Ambulatory Visit
Admission: RE | Admit: 2015-12-31 | Discharge: 2015-12-31 | Disposition: A | Discharge status: Home / Self Care | Payer: BLUE CROSS/BLUE SHIELD | Source: Ambulatory Visit | Attending: Medical | Admitting: Medical

## 2015-12-31 ENCOUNTER — Other Ambulatory Visit: Payer: Self-pay | Admitting: Medical

## 2015-12-31 DIAGNOSIS — M25552 Pain in left hip: Secondary | ICD-10-CM

## 2016-01-08 ENCOUNTER — Encounter: Payer: Self-pay | Admitting: Obstetrics & Gynecology

## 2016-01-08 DIAGNOSIS — N912 Amenorrhea, unspecified: Secondary | ICD-10-CM

## 2016-01-09 NOTE — Telephone Encounter (Signed)
Order placed for [facility] after Mychart message sent from pt.  She needs to be scheduled for this lab before her next AEX.  Can you call and schedule lab appt?  Thanks.

## 2016-01-25 ENCOUNTER — Other Ambulatory Visit: Payer: Self-pay | Admitting: *Deleted

## 2016-01-25 MED ORDER — METOPROLOL SUCCINATE ER 50 MG PO TB24: ORAL_TABLET | ORAL | Status: DC

## 2016-01-25 NOTE — Telephone Encounter (Signed)
Requested Prescriptions   Signed Prescriptions Disp Refills  . metoprolol succinate (TOPROL-XL) 50 MG 24 hr tablet 90 tablet 3    Sig: TAKE 1 TABLET (50 MG TOTAL) BY MOUTH DAILY. TAKE WITH OR IMMEDIATELY FOLLOWING A MEAL.    Authorizing Provider: Kathlyn Sacramento A    Ordering User: Britt Bottom

## 2016-01-29 ENCOUNTER — Other Ambulatory Visit: Payer: Self-pay | Admitting: Orthopedic Surgery

## 2016-01-29 DIAGNOSIS — M5416 Radiculopathy, lumbar region: Secondary | ICD-10-CM

## 2016-02-01 ENCOUNTER — Encounter: Payer: Self-pay | Admitting: Obstetrics & Gynecology

## 2016-02-05 ENCOUNTER — Other Ambulatory Visit: Payer: BLUE CROSS/BLUE SHIELD

## 2016-02-05 DIAGNOSIS — N912 Amenorrhea, unspecified: Secondary | ICD-10-CM

## 2016-02-05 LAB — FOLLICLE STIMULATING HORMONE: FSH: 3.3 m[IU]/mL

## 2016-02-10 ENCOUNTER — Ambulatory Visit
Admission: RE | Admit: 2016-02-10 | Discharge: 2016-02-10 | Disposition: A | Discharge status: Home / Self Care | Payer: BLUE CROSS/BLUE SHIELD | Source: Ambulatory Visit | Attending: Orthopedic Surgery | Admitting: Orthopedic Surgery

## 2016-02-10 ENCOUNTER — Encounter: Payer: Self-pay | Admitting: Obstetrics & Gynecology

## 2016-02-10 ENCOUNTER — Telehealth: Payer: Self-pay | Admitting: Emergency Medicine

## 2016-02-10 DIAGNOSIS — Z30433 Encounter for removal and reinsertion of intrauterine contraceptive device: Secondary | ICD-10-CM

## 2016-02-10 DIAGNOSIS — M5416 Radiculopathy, lumbar region: Secondary | ICD-10-CM

## 2016-02-10 NOTE — Telephone Encounter (Signed)
Call to patient. She is scheduled for IUD removal and insertion with Dr. Sabra Heck tomorrow with annual exam.  With last insertion patient needed pre-medication in office.   Advised patient to please come to check in for appointment at 0800 with a driver and she can plan for pre-medication in the office, will need to sign consent before any pre-medications . Confirmed Ativan is available in the office per Nursing Supervisor, Lamont Snowball, RN.  Orders placed for IUD pre-certification.   Routing to provider for final review. Patient agreeable to disposition. Will close encounter.

## 2016-02-10 NOTE — Telephone Encounter (Signed)
Call to patient. She will come at 0800 for pre-medication, sign consent, annual exam and IUD insertion.

## 2016-02-10 NOTE — Telephone Encounter (Signed)
Chief Complaint  Patient presents with  . Appointment    Patient sent mychart message   . Advice Only    ===View-only below this line===   ----- Message -----    From: Rushie Goltz    Sent: 02/10/2016  8:49 AM EDT      To: Lyman Speller, MD Subject: Non-Urgent Medical Question  I got your test results.  Looks like I will need that valium.  If my appt is at 8:45 let me know best time to take it.  My mom will be bringing me.  Hopefully THIS will be the last one!  See you tomorrow!  Olivia Mackie

## 2016-02-11 ENCOUNTER — Ambulatory Visit (INDEPENDENT_AMBULATORY_CARE_PROVIDER_SITE_OTHER): Payer: BLUE CROSS/BLUE SHIELD | Admitting: Obstetrics & Gynecology

## 2016-02-11 VITALS — BP 110/80 | HR 86 | Resp 16 | Ht 62.0 in | Wt 164.0 lb

## 2016-02-11 DIAGNOSIS — Z30430 Encounter for insertion of intrauterine contraceptive device: Secondary | ICD-10-CM | POA: Diagnosis not present

## 2016-02-11 DIAGNOSIS — Z124 Encounter for screening for malignant neoplasm of cervix: Secondary | ICD-10-CM

## 2016-02-11 DIAGNOSIS — Z01419 Encounter for gynecological examination (general) (routine) without abnormal findings: Secondary | ICD-10-CM | POA: Diagnosis not present

## 2016-02-11 DIAGNOSIS — Z30433 Encounter for removal and reinsertion of intrauterine contraceptive device: Secondary | ICD-10-CM | POA: Diagnosis not present

## 2016-02-11 LAB — POCT URINE PREGNANCY: Preg Test, Ur: NEGATIVE

## 2016-02-11 MED ORDER — FLUOXETINE HCL 20 MG PO TABS: 20.0000 mg | ORAL_TABLET | Freq: Every day | ORAL | Status: DC

## 2016-02-11 MED ORDER — VALACYCLOVIR HCL 1 G PO TABS: ORAL_TABLET | ORAL | Status: DC

## 2016-02-11 NOTE — Progress Notes (Signed)
45 y.o. G2P2 MarriedCaucasianF here for annual exam.  Doing well.  No vaginal bleeding.  IUD needs to be removed and replaced today.  [place] at [place] and hasn't seen her PCP in several years.  Panel was normal there within the last few months.  Declines additional blood work today.  No LMP recorded. Patient is not currently having periods (Reason: IUD).          Sexually active: Yes.    The current method of family planning is IUD.    Exercising: Yes.    boot camp - aerobic Smoker:  no  Health Maintenance: Pap:  12/02/2013 Neg, neg Pap with neg HPV 12/12 History of abnormal Pap:  yes MMG:  03/04/15 BIRADS1:neg Colonoscopy:  2006 Normal - repeat at 49 BMD:   Never TDaP:  03/2008 Screening Labs: PCP, Urine today: PCP   reports that she has never smoked. She has never used smokeless tobacco. She reports that she does not drink alcohol or use illicit drugs.  Past Medical History  Diagnosis Date  . History of chicken pox   . Headache(784.0)   . GERD (gastroesophageal reflux disease)   . Migraines     associated with menses in past, tx with excedrin, may last up to 1 week  . UTI (lower urinary tract infection)   . Breast cyst 2011    bx benign  . Shingles 04/2011  . Internal hemorrhoid 2006    dx during colon w/Dr Tiffany Kocher  . Allergic rhinitis     Dr. Doreene Nest, s/p allergy testing  . Basal cell carcinoma of anterior chest     removed  . Asthma   . Depression     after father died, on [place] in past  . Sinusitis   . Migraines   . Gestational diabetes     with first child  . Anemia   . Pericarditis 12/13    Past Surgical History  Procedure Laterality Date  . Tonsillectomy  1988  . Nasal sinus surgery  1999, 2005  . Knee arthroscopy w/ meniscal repair  1989    Right, Dr. Mauri Pole  . Basal cell carcinoma excision  2011    removed from chest/[place]  . Intrauterine device insertion  01/2011    Dr Sabra Heck  . Vaginal delivery      x 2  . Breast biopsy  2011     At [facility] /Dx Cyst  . Cholecystectomy  2010    [facility]  . Colonoscopy      Current Outpatient Prescriptions  Medication Sig Dispense Refill  . ADVAIR DISKUS 250-50 MCG/DOSE AEPB INHALE 1 PUFF AS DIRECTED TWICE A DAY  1  . albuterol (VENTOLIN HFA) 108 (90 BASE) MCG/ACT inhaler Inhale 2 puffs into the lungs every 6 (six) hours as needed. For shortness of breath    . aspirin-acetaminophen-caffeine (EXCEDRIN MIGRAINE) 250-250-65 MG per tablet Take 1 tablet by mouth every 6 (six) hours as needed.    . cetirizine (ZYRTEC) 10 MG tablet Take 10 mg by mouth daily.    . cholecalciferol (VITAMIN D) 1000 units tablet Take 1,000 Units by mouth daily.    . cyanocobalamin 100 MCG tablet Take 100 mcg by mouth daily.    Marland Kitchen FLUoxetine (PROZAC) 20 MG tablet Take 1 tablet (20 mg total) by mouth daily. 30 tablet 12  . fluticasone (FLONASE) 50 MCG/ACT nasal spray Place 2 sprays into the nose as needed.     . metoprolol succinate (TOPROL-XL)  50 MG 24 hr tablet TAKE 1 TABLET (50 MG TOTAL) BY MOUTH DAILY. TAKE WITH OR IMMEDIATELY FOLLOWING A MEAL. 90 tablet 3  . Multiple Vitamin (MULTIVITAMIN WITH MINERALS) TABS Take 1 tablet by mouth daily.    . valACYclovir (VALTREX) 1000 MG tablet TAKE AS DIRECTED 30 tablet 3   No current facility-administered medications for this visit.    Family History  Problem Relation Age of Onset  . Hypertension Mother   . Cancer Mother 81    Breast Ca  . Arthritis Mother   . Cancer Father 10    Pancreatic  . Diabetes Father   . Hypertension Father   . Hyperlipidemia Father   . Heart disease Father   . Cancer Maternal Grandmother     Stomach - 6's  . Alcohol abuse Maternal Grandfather   . Alcohol abuse Paternal Grandfather   . Heart disease Paternal Grandfather   . Hyperlipidemia Paternal Grandfather   . Hypertension Paternal Grandfather   . Cancer Sister 30    Thyroid  . Other Paternal Grandmother     brain tumor on pitutary gland    ROS:   Pertinent items are noted in HPI.  Otherwise, a comprehensive ROS was negative.  Exam:   BP 110/80 mmHg  Pulse 86  Resp 16  Ht 5\' 2"  (1.575 m)  Wt 164 lb (74.39 kg)  BMI 29.99 kg/m2  Weight change: -4#  Height: 5\' 2"  (157.5 cm)  Ht Readings from Last 3 Encounters:  02/11/16 5\' 2"  (1.575 m)  09/18/15 5\' 2"  (1.575 m)  01/19/15 5\' 2"  (1.575 m)    General appearance: alert, cooperative and appears stated age Head: Normocephalic, without obvious abnormality, atraumatic Neck: no adenopathy, supple, symmetrical, trachea midline and thyroid normal to inspection and palpation Lungs: clear to auscultation bilaterally Breasts: normal appearance, no masses or tenderness Heart: regular rate and rhythm Abdomen: soft, non-tender; bowel sounds normal; no masses,  no organomegaly Extremities: extremities normal, atraumatic, no cyanosis or edema Skin: Skin color, texture, turgor normal. No rashes or lesions Lymph nodes: Cervical, supraclavicular, and axillary nodes normal. No abnormal inguinal nodes palpated Neurologic: Grossly normal   Pelvic: External genitalia:  no lesions              Urethra:  normal appearing urethra with no masses, tenderness or lesions              Bartholins and Skenes: normal                 Vagina: normal appearing vagina with normal color and discharge, no lesions              Cervix: no lesions              Pap taken: Yes.   Bimanual Exam:  Uterus:  normal size, contour, position, consistency, mobility, non-tender and IUD string noted              Adnexa: normal adnexa               Rectovaginal: Confirms               Anus:  normal sphincter tone, no lesions  Pt aware she is slightly overdue to IUD removal.  Aware of small risk of interruption of early pregnancy.  Desires to proceed today.  Lorazepam 1mg  pox 1 given at 8:10am.  [id number].  Exp: 7/17.   Speculum placed.  Cervix cleansed with Betadine x 3.  Anterior lip of  cervix grasped with single toothed  tenaculum.  Paracervical block of Lidocaine 1% placed.  Exp 01/19/17, Lot: [id number].  IUD Lot [id number].  Exp 9/19.  10 cc's total used.  IUD string was not seen.  IUD hook obtained and passed into uterine cavity.  With remove of this device twice, string noted and removed with one pull.  Uterus sounded to 8cm.  New IUD and introducer obtained.  Passed to fundus.  IUD released and introducer removed.  Strings cut to 2cm.  Tenaculum removed.  Minimal bleeding noted.  Pt tolerated procedure well.    Chaperone was present for exam.  A:  Well Woman with normal exam Mirena IUD use, FSH value 3.3 earlier this week H/O Depression/anxiety SUI, mild Mother with hx of breast cancer age 46 H/O facial HSV 1  P: Mammogram yearly. With family hx, pt is doing yearly.  pap smear 1/15 was neg. Neg HR HPV 12/12. Pap with HR HPV obtained today Lab work done through work Prozac 20mg  daily.  Rx for #30/12 RF to pharamcy. Rx for valtrex to be take 2gram bid to complete two doses to pharmacy today.   IUD recheck 6 weeks.  return annually or prn

## 2016-02-14 MED ORDER — LEVONORGESTREL 20 MCG/24HR IU IUD: 1.0000 | INTRAUTERINE_SYSTEM | Freq: Once | INTRAUTERINE | Status: DC

## 2016-02-15 ENCOUNTER — Encounter: Payer: Self-pay | Admitting: Obstetrics & Gynecology

## 2016-02-15 LAB — IPS PAP TEST WITH HPV

## 2016-02-17 ENCOUNTER — Encounter: Payer: Self-pay | Admitting: Obstetrics & Gynecology

## 2016-02-18 MED ORDER — FLUCONAZOLE 150 MG PO TABS: 150.0000 mg | ORAL_TABLET | Freq: Once | ORAL | Status: DC

## 2016-02-18 NOTE — Telephone Encounter (Signed)
Rx for Diflucan sent to pharmacy on file due to yeast noted on pap result.  Pt send message stating she did want rx called into pharmacy.  Message sent back to pt to notify this was completed.

## 2016-02-18 NOTE — Telephone Encounter (Signed)
Spoke with patient and office visit with Dr. Sabra Heck scheduled for tomorrow 02/19/16 at 11:30.  Patient agreeable to appointment. Encounter has been closed.

## 2016-02-19 ENCOUNTER — Ambulatory Visit (INDEPENDENT_AMBULATORY_CARE_PROVIDER_SITE_OTHER): Payer: BLUE CROSS/BLUE SHIELD | Admitting: Obstetrics & Gynecology

## 2016-02-19 ENCOUNTER — Encounter: Payer: Self-pay | Admitting: *Deleted

## 2016-02-19 VITALS — BP 104/70 | HR 64 | Temp 98.1°F | Ht 62.0 in | Wt 164.0 lb

## 2016-02-19 DIAGNOSIS — R102 Pelvic and perineal pain: Secondary | ICD-10-CM

## 2016-02-19 DIAGNOSIS — N949 Unspecified condition associated with female genital organs and menstrual cycle: Secondary | ICD-10-CM | POA: Diagnosis not present

## 2016-02-19 LAB — POCT URINALYSIS DIPSTICK
Bilirubin, UA: NEGATIVE
Blood, UA: NEGATIVE
Glucose, UA: NEGATIVE
Ketones, UA: NEGATIVE
Leukocytes, UA: NEGATIVE
Nitrite, UA: NEGATIVE
Protein, UA: NEGATIVE
Urobilinogen, UA: NEGATIVE
pH, UA: 7

## 2016-02-19 MED ORDER — METRONIDAZOLE 500 MG PO TABS: 500.0000 mg | ORAL_TABLET | Freq: Two times a day (BID) | ORAL | Status: DC

## 2016-02-19 NOTE — Progress Notes (Signed)
Subjective:     Patient ID: Hailey Wilkins, female   DOB: 1971-04-24, 45 y.o.   MRN: [id number]  HPI 45 yo G2P2 MWF here for complaint of continued cramping after having an IUD removed and replaced on 02/11/16.  IUD strings could not be visualized and, therefore, IUD string hook was required for removal.  Although this was done under sterile conditions, several passes of this instrument were required.  Pt did well with the procedure but has continued to have cramping.  She denies vaginal discharge or fevers.  She particularly feels cramping when her bladder is full.    Denies back pain.  Review of Systems  Genitourinary: Positive for pelvic pain.  All other systems reviewed and are negative.      Objective:   Physical Exam  Constitutional: She is oriented to person, place, and time. She appears well-developed and well-nourished.  Abdominal: Soft. Bowel sounds are normal. She exhibits no distension and no mass. There is no tenderness. There is no rebound and no guarding.  Genitourinary: Vagina normal. There is no rash, tenderness, lesion or injury on the right labia. There is no rash, tenderness, lesion or injury on the left labia. Uterus is tender. Uterus is not deviated, not enlarged and not fixed. Cervix exhibits motion tenderness. Cervix exhibits no discharge and no friability. Right adnexum displays no mass, no tenderness and no fullness. Left adnexum displays no mass, no tenderness and no fullness.  IUD strings noted.  Lymphadenopathy:       Right: No inguinal adenopathy present.       Left: No inguinal adenopathy present.  Neurological: She is alert and oriented to person, place, and time.  Skin: Skin is warm and dry.  Psychiatric: She has a normal mood and affect.   Brief bedside ultrasound performed using Mirena IUD within uterus.  No evidence of perforation noted.     Assessment:     Uterine tenderness, possible endometritis, after removal and replacement of Mirena IUD      Plan:     Flagyl 500mg  bid x 5 days  Pt to give update on Monday.  If still having discomfort, will plan PUS and consider IUD removal

## 2016-02-22 ENCOUNTER — Encounter: Payer: Self-pay | Admitting: Obstetrics & Gynecology

## 2016-03-09 DIAGNOSIS — M5136 Other intervertebral disc degeneration, lumbar region: Secondary | ICD-10-CM | POA: Diagnosis not present

## 2016-03-09 DIAGNOSIS — M5416 Radiculopathy, lumbar region: Secondary | ICD-10-CM | POA: Diagnosis not present

## 2016-03-22 ENCOUNTER — Encounter: Payer: Self-pay | Admitting: Obstetrics & Gynecology

## 2016-03-29 ENCOUNTER — Ambulatory Visit (INDEPENDENT_AMBULATORY_CARE_PROVIDER_SITE_OTHER): Payer: BLUE CROSS/BLUE SHIELD | Admitting: Obstetrics & Gynecology

## 2016-03-29 ENCOUNTER — Encounter: Payer: Self-pay | Admitting: Obstetrics & Gynecology

## 2016-03-29 VITALS — BP 104/68 | HR 82 | Resp 12 | Ht 62.0 in | Wt 167.7 lb

## 2016-03-29 DIAGNOSIS — Z30431 Encounter for routine checking of intrauterine contraceptive device: Secondary | ICD-10-CM | POA: Diagnosis not present

## 2016-03-29 DIAGNOSIS — R102 Pelvic and perineal pain: Secondary | ICD-10-CM

## 2016-03-29 DIAGNOSIS — N9089 Other specified noninflammatory disorders of vulva and perineum: Secondary | ICD-10-CM

## 2016-03-29 NOTE — Progress Notes (Signed)
Subjective:     Patient ID: Hailey Wilkins, female   DOB: [birth year], 45 y.o.   MRN: [id number]  HPI 45 yo G2P2 MWF here for follow up after being treated for endometritis that was due to difficult IUD removal with subsequent new IUD placement.  Pt was treated with flagyl bid x 5 days.  Symptoms improved quickly.  She denies pain now or pain with intercourse.  Her only complaint is an intermittent RLQ "twinge" that she feels from time to time and cannot attribute to anything in particular.  Denies bowel or bladder changes.  Also, has noted a firmness on her labia that she would like me to check.  Reports the area is a little firm.  She's put steroid ointment, tea tree oil, other essential oils on it without much change.  Denies recent trauma.    Review of Systems  All other systems reviewed and are negative.      Objective:   Physical Exam  Constitutional: She is oriented to person, place, and time. She appears well-developed and well-nourished.  Abdominal: Soft. Bowel sounds are normal. She exhibits no distension and no mass. There is no tenderness. There is no rebound and no guarding.  Genitourinary: Vagina normal and uterus normal.    There is lesion on the right labia. There is no rash, tenderness or injury on the right labia. There is no rash, tenderness, lesion or injury on the left labia. Cervix exhibits no motion tenderness. Right adnexum displays no mass, no tenderness and no fullness. Left adnexum displays no mass, no tenderness and no fullness.  2cm IUD string noted at os  Lymphadenopathy:       Right: No inguinal adenopathy present.       Left: No inguinal adenopathy present.  Neurological: She is alert and oriented to person, place, and time.  Skin: Skin is warm and dry.  Psychiatric: She has a normal mood and affect.       Assessment:     Resolved endometritis after difficult IUD removal IUD appears to have normal placement Firmness within right labial major, not  consistent with abscess or lymph node     Plan:     Recheck 1 month.  If persistent, will plan imaging.

## 2016-04-07 ENCOUNTER — Other Ambulatory Visit: Payer: Self-pay | Admitting: Obstetrics & Gynecology

## 2016-04-07 DIAGNOSIS — Z1231 Encounter for screening mammogram for malignant neoplasm of breast: Secondary | ICD-10-CM

## 2016-04-25 ENCOUNTER — Ambulatory Visit (INDEPENDENT_AMBULATORY_CARE_PROVIDER_SITE_OTHER): Payer: BLUE CROSS/BLUE SHIELD | Admitting: Obstetrics & Gynecology

## 2016-04-25 ENCOUNTER — Encounter: Payer: Self-pay | Admitting: Obstetrics & Gynecology

## 2016-04-25 VITALS — BP 112/60 | HR 68 | Resp 18 | Ht 62.0 in | Wt 172.0 lb

## 2016-04-25 DIAGNOSIS — N9089 Other specified noninflammatory disorders of vulva and perineum: Secondary | ICD-10-CM | POA: Diagnosis not present

## 2016-04-25 NOTE — Progress Notes (Signed)
GYNECOLOGY  VISIT   HPI: 45 y.o. G2P2 Married Caucasian female with finding of right labial "firmness" that was noted on exam 03/29/16.  Pt found this sometime after IUD placement in march and her follow-up in May.  Area is not tender.  She denies any drainage or trauma.   GYNECOLOGIC HISTORY: No LMP recorded. Patient is not currently having periods (Reason: IUD). Contraception: Mirena IUD  Patient Active Problem List   Diagnosis Date Noted  . Emotional lability 01/23/2015  . Pain of left calf 09/24/2013  . Asthma, mild intermitent 12/18/2012  . Allergic rhinitis 12/18/2012  . Sinus tachycardia ([place]) 12/15/2012  . Chest pain on breathing 11/09/2012  . Migraine 05/31/2012  . Hot flashes 04/23/2012  . Multiple drug allergies 02/13/2012   MEDS:  Reviewed in EPIC and UTD  ALLERGIES: Amoxicillin; Ceftriaxone; Erythromycin; Levofloxacin; Rocephin; Shellfish allergy; Tetracyclines & related; Amoxicillin-pot clavulanate; Clindamycin/lincomycin; Codeine; Latex; Levaquin; and Sulfa antibiotics  SH:  Non smoker, married  Review of Systems  All other systems reviewed and are negative.   PHYSICAL EXAMINATION:    BP 112/60 mmHg  Pulse 68  Resp 18  Ht 5\' 2"  (1.575 m)  Wt 172 lb (78.019 kg)  BMI 31.45 kg/m2     Physical Exam  Constitutional: She is well-developed, well-nourished, and in no distress.  Abdominal: Soft. Bowel sounds are normal.  Genitourinary: Vagina normal. Vulva exhibits lesion.  NAEFG except for persistent 2.5 x 1.5cm mass in right labia majora.  Skin: Skin is warm and dry.   Assessment: Right labial mass, lipoma?  Plan: Plan PUS of labia to assess for lipoma.  May need pelvic MRI if this is not diagnostic.  Pt in agreement with plan.

## 2016-04-25 NOTE — Progress Notes (Signed)
Scheduled patient while in office for ultrasound to evaluate for right labia lipoma on 04/28/2016 at 04/28/2016 at 12:30 pm with 1:15 pm consult with Dr.Miller. She is agreeable to date and time.

## 2016-04-27 DIAGNOSIS — L304 Erythema intertrigo: Secondary | ICD-10-CM | POA: Diagnosis not present

## 2016-04-28 ENCOUNTER — Ambulatory Visit (INDEPENDENT_AMBULATORY_CARE_PROVIDER_SITE_OTHER): Payer: BLUE CROSS/BLUE SHIELD

## 2016-04-28 ENCOUNTER — Ambulatory Visit: Payer: BLUE CROSS/BLUE SHIELD

## 2016-04-28 ENCOUNTER — Encounter: Payer: Self-pay | Admitting: Obstetrics & Gynecology

## 2016-04-28 ENCOUNTER — Ambulatory Visit (INDEPENDENT_AMBULATORY_CARE_PROVIDER_SITE_OTHER): Payer: BLUE CROSS/BLUE SHIELD | Admitting: Obstetrics & Gynecology

## 2016-04-28 VITALS — BP 110/80 | HR 78 | Resp 14 | Wt 170.0 lb

## 2016-04-28 DIAGNOSIS — N9089 Other specified noninflammatory disorders of vulva and perineum: Secondary | ICD-10-CM

## 2016-04-28 NOTE — Progress Notes (Signed)
45 y.o. G2P2 Married Caucasian female here for pelvic ultrasound due to labial mass noted by pt and followed conservatively.  Pt desired to know if this was something that needed to be removed so PUS of vulva was recommended.  I felt it was likely a lipoma.  Pt also has questions today about having a breast reduction.  She has back and neck pain, marks in her shoulders where her bra strap goes, and just feels she would like to be much smaller.  Questions about procedure, surgeons, recovery, typical size that women reduce to as well as insurance coverage.  All of these answered to the best of my ability and my limitations about specific depth of knowledge of these questions was discussed.  No LMP recorded. Patient is not currently having periods (Reason: IUD).  Contraception: Mirena IUD  Findings:  Vulva:  Small cystic area 4 x 2 x 65mm noted in location pt indicated.  Avascular.  Low level echoes within.  Discussion:  Images reviewed with pt.  Doubtful this is a lipoma.  However, I am not completely sure what it is.  Findings are consistent with benign features.  Pt and I discussed recheck at specific intervals of time to watch for growth with plan to remove it if grows.  Pt comfortable with this plan.  Assessment:  Vulva mass Enlarged breasts, desirous of reduction  Plan:  Recheck 6 months.  If enlarging, will plan to remove Names for breast surgeons given.  Pt appreciative.  ~15 minutes spent with patient >50% of time was in face to face discussion of above.

## 2016-05-05 ENCOUNTER — Encounter: Payer: Self-pay | Admitting: Internal Medicine

## 2016-05-11 ENCOUNTER — Other Ambulatory Visit: Payer: Self-pay | Admitting: Obstetrics & Gynecology

## 2016-05-11 ENCOUNTER — Ambulatory Visit
Admission: RE | Admit: 2016-05-11 | Discharge: 2016-05-11 | Disposition: A | Discharge status: Home / Self Care | Payer: BLUE CROSS/BLUE SHIELD | Source: Ambulatory Visit | Attending: Obstetrics & Gynecology | Admitting: Obstetrics & Gynecology

## 2016-05-11 DIAGNOSIS — Z1231 Encounter for screening mammogram for malignant neoplasm of breast: Secondary | ICD-10-CM | POA: Diagnosis not present

## 2016-05-12 DIAGNOSIS — M2391 Unspecified internal derangement of right knee: Secondary | ICD-10-CM | POA: Diagnosis not present

## 2016-05-12 DIAGNOSIS — M23231 Derangement of other medial meniscus due to old tear or injury, right knee: Secondary | ICD-10-CM | POA: Diagnosis not present

## 2016-05-13 DIAGNOSIS — M2391 Unspecified internal derangement of right knee: Secondary | ICD-10-CM | POA: Diagnosis not present

## 2016-05-17 DIAGNOSIS — M5416 Radiculopathy, lumbar region: Secondary | ICD-10-CM | POA: Diagnosis not present

## 2016-05-17 DIAGNOSIS — M5136 Other intervertebral disc degeneration, lumbar region: Secondary | ICD-10-CM | POA: Diagnosis not present

## 2016-06-01 DIAGNOSIS — N62 Hypertrophy of breast: Secondary | ICD-10-CM | POA: Diagnosis not present

## 2016-06-02 ENCOUNTER — Encounter: Payer: Self-pay | Admitting: Obstetrics & Gynecology

## 2016-06-02 ENCOUNTER — Telehealth: Payer: Self-pay

## 2016-06-02 NOTE — Telephone Encounter (Signed)
Visit Follow-Up Question  Message [id number]   From  Hailey Wilkins Babel   To  Megan Salon, MD   Sent  06/02/2016 11:19 AM     Hey Dr Sabra Heck!  Thank you for telling me about Dr. Harlow Mares. I had an appointment with him yesterday. He thinks that I would greatly benefit from a breast reduction so we are trying to gather information to submit to the insurance company so hopefully they will cover it. Do you have anything in your notes that states me complaining of bank pain? If so, Can we get a copy of that? Also, Dr. Harlow Mares also asked if you could do a closure note on my PT that I had through my employer stating it did not give long term relief. Or anything that you can do to help get this covered by insurance.  Let me know your thoughts.  Thanks!  Hailey Wilkins      Responsible Party    Pool - [facility] Clinical Pool No one has taken responsibility for this message.     No actions have been taken on this message.     Routing to [place] for review and advise.

## 2016-06-02 NOTE — Telephone Encounter (Signed)
Telephone encounter created to discuss with Dr.Miller.

## 2016-06-14 NOTE — Telephone Encounter (Signed)
Patient called today and inquired about the office notes regarding her back pain and breast reduction. Please see message below

## 2016-06-14 NOTE — Telephone Encounter (Signed)
I have not documented in my notes anything about her back pain.  I'm happy to do the documentation for the PT not helping but how does she need this--in a letter to insurance or in an office note?  If the second, I probably need to see her again so this can be formally documented.  Thanks.

## 2016-06-16 NOTE — Telephone Encounter (Signed)
I spoke to the patient and gave her Dr. Ammie Ferrier recommendation.  She spoke with Dr. Fredrik Rigger office and has been told office notes are better than a letter for documentation.   Pt is agreeable to office visit for evaluation.  appointment is scheduled for 06/20/16 @ 8:30am with Dr. Sabra Heck.  Routing to provider for final review.  Closing encounter.

## 2016-06-16 NOTE — Telephone Encounter (Signed)
Spoke with patient and she is going to find out if a letter will okay for the documentation she is needing regarding her breast reduction. -eh

## 2016-06-20 ENCOUNTER — Encounter: Payer: Self-pay | Admitting: Obstetrics & Gynecology

## 2016-06-20 ENCOUNTER — Ambulatory Visit (INDEPENDENT_AMBULATORY_CARE_PROVIDER_SITE_OTHER): Payer: BLUE CROSS/BLUE SHIELD | Admitting: Obstetrics & Gynecology

## 2016-06-20 VITALS — BP 110/66 | HR 74 | Resp 16 | Ht 62.0 in | Wt 175.0 lb

## 2016-06-20 DIAGNOSIS — N62 Hypertrophy of breast: Secondary | ICD-10-CM

## 2016-06-20 NOTE — Progress Notes (Signed)
GYNECOLOGY  VISIT   HPI: 45 y.o. G2P2 Married Caucasian female with complaints of neck and shoulder pain due to breast size.  Pt wears 34H cup size.  Strap size is between 1.5 - 2 inches and is padded and she still has indentations in her shoulders.  Pt has tried anti-inflammatories without success.  She has been prescribed a steroid taper without success.  She has also been prescribed narcotic pain medication by [facility].  Pt has not wanted to be on this type of medication.  She has also done physical therapy that helps for a day or two but then the pain comes right back.  Stretching has a similar short term effect.  Massage, as well, does not give long term relief.    Due to neck and shoulder pain, exercise has been a struggle because of worsening pain after exercise.  She does not want to be on chronic pain medication for large breasts.  This seems ridiculous to her.  She is not a "medicine person" anyway so taking something for large breast just seems counterintuitive to her thinking.  I am in agreement this.  At this point, there really isn't anything to consider except for breast reduction.  Weight today is 175 and BMI is 32.0.  Pt reports in her adult life, her cup size not been lower than a G, even when she weighed 25 pounds less.  This is a chronic, long term problem for her that she is just tired of dealing with at this point.    Last MMG:  05/14/16.  Normal.  Grade B density.  H/O breast cyst aspiration x 1 for a benign cyst.  She is not on any estrogen.  Has a Mirena IUD for contraception.    GYNECOLOGIC HISTORY: No LMP recorded. Patient is not currently having periods (Reason: IUD). Contraception: Mirena IUD  Past Medical History:  Diagnosis Date  . Allergic rhinitis    Dr. Doreene Nest, s/p allergy testing  . Anemia   . Asthma   . Basal cell carcinoma of anterior chest    removed  . Breast cyst 2011   bx benign  . Depression    after father died, on [place] in past  .  GERD (gastroesophageal reflux disease)   . Gestational diabetes    with first child  . Headache(784.0)   . History of chicken pox   . Internal hemorrhoid 2006   dx during colon w/Dr Tiffany Kocher  . Migraines    associated with menses in past, tx with excedrin, may last up to 1 week  . Migraines   . Pericarditis 12/13  . Shingles 04/2011  . Sinusitis   . UTI (lower urinary tract infection)     Past Surgical History:  Procedure Laterality Date  . BASAL CELL CARCINOMA EXCISION  2011   [facility]oved from chest/[place] [facility]  . BREAST CYST ASPIRATION Left 2011   neg  . CHOLECYSTECTOMY  2010   [facility]  Marland Kitchen COLONOSCOPY    . INTRAUTERINE DEVICE INSERTION  01/2011   Dr Sabra Heck  . KNEE ARTHROSCOPY W/ MENISCAL REPAIR  1989   Right, Dr. Mauri Pole  . NASAL SINUS SURGERY  1999, 2005  . TONSILLECTOMY  1988  . VAGINAL DELIVERY     x 2    MEDS:  Reviewed in EPIC and UTD  ALLERGIES: Amoxicillin; Ceftriaxone; Erythromycin; Levofloxacin; Rocephin [ceftriaxone sodium in dextrose]; Shellfish allergy; Tetracyclines & related; Amoxicillin-pot clavulanate; Clindamycin/lincomycin; Codeine; Latex; Levaquin [levofloxacin]; and Sulfa antibiotics  Family History  Problem Relation Age of Onset  . Hypertension Mother   . Cancer Mother 58    Breast Ca  . Arthritis Mother   . Breast cancer Mother 67  . Cancer Father 63    Pancreatic  . Diabetes Father   . Hypertension Father   . Hyperlipidemia Father   . Heart disease Father   . Cancer Maternal Grandmother     Stomach - 36's  . Alcohol abuse Maternal Grandfather   . Alcohol abuse Paternal Grandfather   . Heart disease Paternal Grandfather   . Hyperlipidemia Paternal Grandfather   . Hypertension Paternal Grandfather   . Cancer Sister 74    Thyroid  . Other Paternal Grandmother     brain tumor on pitutary gland  . Breast cancer Maternal Aunt   . Breast cancer Cousin     mat cousin    SH:  Non smoker, married  Review of Systems   Musculoskeletal: Positive for back pain and neck pain.  Neurological: Positive for headaches.  All other systems reviewed and are negative.   PHYSICAL EXAMINATION:    BP 110/66 (BP Location: Right Arm, Patient Position: Sitting, Cuff Size: Large)   Pulse 74   Resp 16   Ht 5\' 2"  (1.575 m)   Wt 175 lb (79.4 kg)   BMI 32.01 kg/m     General appearance: alert, cooperative and appears stated age Neck: no adenopathy, supple, symmetrical, trachea midline and thyroid normal to inspection and palpation CV:  Regular rate and rhythm Lungs:  clear to auscultation, no wheezes, rales or rhonchi, symmetric air entry Breasts: normal appearance, no masses or tenderness, enlarged  Chaperone was present for exam.  Assessment: Neck and shoulder pain due to breast hypertrophy (Size H cups)  Plan: Pt considering breast reduction and I am completley supportive of this.  I know this pt well and she is not a person with body image issues.  Pt will proceed with these plan.

## 2016-06-27 DIAGNOSIS — M4726 Other spondylosis with radiculopathy, lumbar region: Secondary | ICD-10-CM | POA: Diagnosis not present

## 2016-06-27 DIAGNOSIS — M5416 Radiculopathy, lumbar region: Secondary | ICD-10-CM | POA: Diagnosis not present

## 2016-06-28 ENCOUNTER — Encounter: Payer: Self-pay | Admitting: Obstetrics & Gynecology

## 2016-06-29 ENCOUNTER — Telehealth: Payer: Self-pay

## 2016-06-29 NOTE — Telephone Encounter (Signed)
Telephone encounter created to discuss mychart message with Dr.Miller upon her return to the office.

## 2016-06-29 NOTE — Telephone Encounter (Signed)
Routing to [place] for review and advise upon return to the office.  Visit Follow-Up Question  Message [id number]  From Shaquasia Kaneshiro Macchi To Megan Salon, MD Sent 06/28/2016 12:03 PM  Hi!  After my last visit you have had my wheels turning about this weight loss medicine and I have done some research. It seems that Contrave has less side effects than the Phentermine. However, Would my 20mg  Prozac interfere with those meds? Also, a coworker of mine is having the same kind of weight loss challange that I am and her doctor put her on Vyvanse. It looks like that is an ADHD medication. What do you know about that one? It has really worked for her and when she described how it made her feel it sounded like the Contrave. Thoughts?  I have not heard from [facility] yet as to whether they will cover the surgery or not. But I feel like I will need something to help jump start the weight loss and get me out of the Obese BMI.   Have a great day,  Rosalena   Responsible Party   Pool - [facility] Clinical Pool No one has taken responsibility for this message.  No actions have been taken on this message.

## 2016-07-01 NOTE — Telephone Encounter (Signed)
Patient is calling with medication questions, Patient said she sent an e-mail a couple of days ago and has not received a response.

## 2016-07-01 NOTE — Telephone Encounter (Signed)
Returned call to patient. RN informed patient that her message had been routed to Dr. Sabra Heck and as soon as she gave her recommendations our office would give her an update. Patient agreeable. Patient states all of her questions are covered in the e-mail she sent. Her main concern is one of the weight loss medications contains wellbutrin and she is questioning if she can take this one since she already takes Prozac. Patient agreeable to wait for recommendation from Dr. Sabra Heck.   Routing to provider for review.

## 2016-07-01 NOTE — Telephone Encounter (Signed)
Replied to pt's mychart message via MyChart.  Encounter closed.

## 2016-07-05 ENCOUNTER — Telehealth: Payer: Self-pay | Admitting: Obstetrics & Gynecology

## 2016-07-05 NOTE — Telephone Encounter (Signed)
Return call to patient. She wanted to follow-up with Dr Sabra Heck about starting Contrave as a "jump start" to weight loss. (see previous My Chart message)  Patient last sen 06-20-16 for discussion of breast hypertrophy. Advised would need consult to discuss Contrave risk/benefits. Patient requests I recheck with Dr Sabra Heck. Patient states she just found out today that breast reduction surgery has been approved. It is not scheduled yet. Wants to know if starting Contrave could be a short term jump start to weigh loss prior to her surgery.  Please advise.

## 2016-07-05 NOTE — Telephone Encounter (Signed)
Patient is following up on an e-mail message she was having with Dr. Sabra Heck last week.

## 2016-07-06 NOTE — Telephone Encounter (Signed)
Spoke with patient. Advised I have spoken with Dr.Miller who is going to call in rx for Contrave to her pharmacy on file today. Patient is agreeable and verbalizes understanding. Advise this is a medication that will need to be tapered up in dosage and instructions will be provided on the medication. She is agreeable.

## 2016-07-08 MED ORDER — NALTREXONE-BUPROPION HCL ER 8-90 MG PO TB12: ORAL_TABLET | ORAL | 0 refills | Status: DC

## 2016-07-20 ENCOUNTER — Encounter: Payer: Self-pay | Admitting: Obstetrics & Gynecology

## 2016-08-02 ENCOUNTER — Encounter: Payer: Self-pay | Admitting: Obstetrics & Gynecology

## 2016-08-15 DIAGNOSIS — N62 Hypertrophy of breast: Secondary | ICD-10-CM | POA: Diagnosis not present

## 2016-08-17 ENCOUNTER — Telehealth: Payer: Self-pay | Admitting: Cardiovascular Disease

## 2016-08-17 NOTE — Telephone Encounter (Signed)
Routed OV notes, EKG and echo results to Larrie Kass, Pre-op assessment RN, @ [facility], [phone]. S/w Juliann Pulse who states they do not need MD cardiac clearance.

## 2016-08-25 ENCOUNTER — Other Ambulatory Visit: Payer: Self-pay | Admitting: Plastic Surgery

## 2016-08-25 DIAGNOSIS — N6011 Diffuse cystic mastopathy of right breast: Secondary | ICD-10-CM | POA: Diagnosis not present

## 2016-08-25 DIAGNOSIS — M6208 Separation of muscle (nontraumatic), other site: Secondary | ICD-10-CM | POA: Diagnosis not present

## 2016-08-25 DIAGNOSIS — N6012 Diffuse cystic mastopathy of left breast: Secondary | ICD-10-CM | POA: Diagnosis not present

## 2016-08-25 DIAGNOSIS — N62 Hypertrophy of breast: Secondary | ICD-10-CM | POA: Diagnosis not present

## 2016-08-25 DIAGNOSIS — L987 Excessive and redundant skin and subcutaneous tissue: Secondary | ICD-10-CM | POA: Diagnosis not present

## 2016-10-20 ENCOUNTER — Telehealth: Payer: Self-pay | Admitting: *Deleted

## 2016-10-20 ENCOUNTER — Encounter: Payer: Self-pay | Admitting: Obstetrics & Gynecology

## 2016-10-20 NOTE — Telephone Encounter (Signed)
Sent pt message through [place] as this is a very good way to communicate with her.  Ok to close encounter.

## 2016-10-20 NOTE — Telephone Encounter (Signed)
Dr. Sabra Heck, please advise on restarting contrave?   From Myles Rosenthal Basara To Megan Salon, MD Sent 10/20/2016 11:06 AM  Hi!  I have an update and a question. The surgery went well. I am 8 weeks post op today. I'm having a little issue in the right breast with suture separation so I am having to do wound care. Other than that, its great.   We discussed starting my contrave back after the surgery and I wanted to ask if you thought enough time has lapsed since the surgery to re-start or should I wait until the wounds heal? I go back to see Dr. Harlow Mares on 12/13.    Thanks!!  Olivia Mackie

## 2016-10-25 ENCOUNTER — Other Ambulatory Visit: Payer: Self-pay | Admitting: Obstetrics & Gynecology

## 2016-10-25 ENCOUNTER — Telehealth: Payer: Self-pay | Admitting: *Deleted

## 2016-10-25 MED ORDER — NALTREXONE-BUPROPION HCL ER 8-90 MG PO TB12: ORAL_TABLET | ORAL | 0 refills | Status: DC

## 2016-10-25 NOTE — Telephone Encounter (Signed)
Prescription for Contrave faxed to [facility] [address] in [place]. Fax # [phone].   Routing to provider for final review. Patient agreeable to disposition. Will close encounter.

## 2016-10-28 ENCOUNTER — Ambulatory Visit: Payer: BLUE CROSS/BLUE SHIELD | Admitting: Obstetrics & Gynecology

## 2016-11-01 ENCOUNTER — Other Ambulatory Visit: Payer: Self-pay | Admitting: Obstetrics & Gynecology

## 2016-11-01 NOTE — Telephone Encounter (Signed)
Medication refill request: Valacyclovir Last AEX:  02/11/16 SM Next AEX: 05/23/17 SM Last MMG (if hormonal medication request): 05/11/16 Champ Mungo, [facility] Refill authorized: 02/11/16 #30 3R. Please advise. Thank you.

## 2016-12-01 ENCOUNTER — Encounter: Payer: Self-pay | Admitting: Obstetrics & Gynecology

## 2016-12-01 ENCOUNTER — Ambulatory Visit: Payer: BLUE CROSS/BLUE SHIELD | Admitting: Obstetrics & Gynecology

## 2016-12-01 VITALS — BP 114/72 | HR 84 | Resp 16 | Ht 62.0 in | Wt 170.6 lb

## 2016-12-01 DIAGNOSIS — N9089 Other specified noninflammatory disorders of vulva and perineum: Secondary | ICD-10-CM | POA: Diagnosis not present

## 2016-12-01 DIAGNOSIS — E669 Obesity, unspecified: Secondary | ICD-10-CM | POA: Diagnosis not present

## 2016-12-01 MED ORDER — NALTREXONE-BUPROPION HCL ER 8-90 MG PO TB12: 2.0000 | ORAL_TABLET | Freq: Two times a day (BID) | ORAL | 1 refills | Status: DC

## 2016-12-01 NOTE — Progress Notes (Signed)
GYNECOLOGY  VISIT   HPI: 46 y.o. G2P2 Married Caucasian female here for follow up of her contrave.  Pt restarted her contrave back after surgery December 18th.  On BID dosing and doing ok with this.   Feels that her appetite suppression is good.  She is walking.  Pt is not doing calorie counting.  Recommended she do this to make sure she is getting at last 1200 calories.    GYNECOLOGIC HISTORY: No LMP recorded. Patient is not currently having periods (Reason: IUD). Contraception: IUD  Patient Active Problem List   Diagnosis Date Noted  . Emotional lability 01/23/2015  . Pain of left calf 09/24/2013  . Asthma, mild intermitent 12/18/2012  . Allergic rhinitis 12/18/2012  . Sinus tachycardia 12/15/2012  . Chest pain on breathing 11/09/2012  . Migraine 05/31/2012  . Hot flashes 04/23/2012  . Multiple drug allergies 02/13/2012    Past Medical History:  Diagnosis Date  . Allergic rhinitis    Dr. Doreene Nest, s/p allergy testing  . Anemia   . Asthma   . Basal cell carcinoma of anterior chest    removed  . Breast cyst 2011   bx benign  . Depression    after father died, on [place] in past  . GERD (gastroesophageal reflux disease)   . Gestational diabetes    with first child  . Headache(784.0)   . History of chicken pox   . Internal hemorrhoid 2006   dx during colon w/Dr Tiffany Kocher  . Migraines    associated with menses in past, tx with excedrin, may last up to 1 week  . Migraines   . Pericarditis 12/13  . Shingles 04/2011  . Sinusitis   . UTI (lower urinary tract infection)     Past Surgical History:  Procedure Laterality Date  . BASAL CELL CARCINOMA EXCISION  2011   removed from chest/[place]  . BREAST CYST ASPIRATION Left 2011   neg  . CHOLECYSTECTOMY  2010   [facility]  Marland Kitchen COLONOSCOPY    . INTRAUTERINE DEVICE INSERTION  01/2011   Dr Sabra Heck  . KNEE ARTHROSCOPY W/ MENISCAL REPAIR  1989   Right, Dr. Mauri Pole  . NASAL SINUS SURGERY  1999, 2005  .  TONSILLECTOMY  1988  . VAGINAL DELIVERY     x 2    MEDS:  Reviewed in EPIC and UTD  ALLERGIES: Amoxicillin; Ceftriaxone; Erythromycin; Levofloxacin; Rocephin [ceftriaxone sodium in dextrose]; Shellfish allergy; Tetracyclines & related; Amoxicillin-pot clavulanate; Clindamycin/lincomycin; Codeine; Latex; Levaquin [levofloxacin]; and Sulfa antibiotics  Family History  Problem Relation Age of Onset  . Hypertension Mother   . Cancer Mother 62    Breast Ca  . Arthritis Mother   . Breast cancer Mother 30  . Cancer Father 53    Pancreatic  . Diabetes Father   . Hypertension Father   . Hyperlipidemia Father   . Heart disease Father   . Cancer Sister 39    Thyroid  . Cancer Maternal Grandmother     Stomach - 36's  . Alcohol abuse Maternal Grandfather   . Alcohol abuse Paternal Grandfather   . Heart disease Paternal Grandfather   . Hyperlipidemia Paternal Grandfather   . Hypertension Paternal Grandfather   . Other Paternal Grandmother     brain tumor on pitutary gland  . Breast cancer Maternal Aunt   . Breast cancer Cousin     mat cousin    SH:  Married, non smoker  Review of Systems  All other systems  reviewed and are negative.   PHYSICAL EXAMINATION:    BP 114/72 (BP Location: Right Arm, Patient Position: Sitting, Cuff Size: Normal)   Pulse 84   Resp 16   Ht 5\' 2"  (1.575 m)   Wt 170 lb 9.6 oz (77.4 kg)   BMI 31.20 kg/m     General appearance: alert, cooperative and appears stated age CV:  Regular rate and rhythm Lungs:  clear to auscultation, no wheezes, rales or rhonchi, symmetric air entry Breasts: normal appearance, no masses or tenderness, incisions are healing well Abdomen: soft, non-tender; bowel sounds normal; no masses,  no organomegaly  Pelvic: External genitalia:  no lesions, mass on the right labia is gone              Urethra:  normal appearing urethra with no masses, tenderness or lesions              Bartholins and Skenes: normal                  Anus:  no lesions  Chaperone was present for exam.  Assessment: BMI 31, desired weight loss Vulvar lesion that has resolved  Plan: Pt is having some AM headache and nausea so will decrease the AM dosing to 1 tab instead of two. Rx given for 2 months.  Recheck 8 -10 weeks.   ~15 minutes spent with patient >50% of time was in face to face discussion of above.

## 2016-12-08 ENCOUNTER — Other Ambulatory Visit: Payer: Self-pay | Admitting: Obstetrics & Gynecology

## 2016-12-09 NOTE — Telephone Encounter (Signed)
Medication refill request: valACYclovir 1000mg  Last AEX:  02/11/16 SM Last OV: 12/01/16 - Labial Lesion Next AEX: 05/23/17  Last MMG (if hormonal medication request): 05/11/16 BIRADS 1 negative Refill authorized: 11/01/16 #30 w/0 refills; today please advise

## 2016-12-20 ENCOUNTER — Encounter: Payer: Self-pay | Admitting: Obstetrics & Gynecology

## 2016-12-20 ENCOUNTER — Telehealth: Payer: Self-pay | Admitting: *Deleted

## 2016-12-20 NOTE — Telephone Encounter (Signed)
Dr. Sabra Heck -see MyChart message below and advise?  From Myles Rosenthal Scheuring To Megan Salon, MD Sent 12/20/2016 3:31 PM  Hi Dr. Sabra Heck,  I am wondering if this [place] is working. I am not eating as much, so I am not craving and I am making healthy choices. I have started exercising (a little) and I have not lost any weight. On 12.19.17 I weighed 171. I weighed again on 1.9.18 and it was 171.5. I weighed again on 1.29.18 and it was 171.  I have lost an inch in my bust (I'm sure due to my surgery as I am still "processing". I have lost 1/2 inch in my waist and 1/4 inch in my hips. It's been 6 weeks. Should I give it longer? What is your suggestion?  Thanks!  Olivia Mackie

## 2016-12-20 NOTE — Telephone Encounter (Signed)
See telephone encounter created 12/20/16 to review with provider.

## 2016-12-20 NOTE — Telephone Encounter (Signed)
Spoke with patient, advised as seen below per Dr. Sabra Heck. Patient states she ill continue for the 3 months and return call if any additional questions after that time. Patient verbalizes understanding and is agreeable.  Routing to provider for final review. Patient is agreeable to disposition. Will close encounter.

## 2016-12-20 NOTE — Telephone Encounter (Signed)
The recommendation is stop if she has not lost 5% of her body weight after 3 months (12 weeks).  It is ok to continue if she'd like.

## 2017-01-06 ENCOUNTER — Encounter: Payer: Self-pay | Admitting: Cardiovascular Disease

## 2017-01-06 ENCOUNTER — Ambulatory Visit (INDEPENDENT_AMBULATORY_CARE_PROVIDER_SITE_OTHER): Payer: BLUE CROSS/BLUE SHIELD | Admitting: Cardiovascular Disease

## 2017-01-06 ENCOUNTER — Ambulatory Visit: Payer: BLUE CROSS/BLUE SHIELD | Admitting: Cardiovascular Disease

## 2017-01-06 VITALS — BP 110/62 | HR 63 | Ht 62.0 in | Wt 172.0 lb

## 2017-01-06 DIAGNOSIS — R000 Tachycardia, unspecified: Secondary | ICD-10-CM | POA: Diagnosis not present

## 2017-01-06 NOTE — Progress Notes (Signed)
Cardiology Office Note   Date:  01/06/2017   ID:  Hailey Wilkins, DOB [birth year], MRN [id number]  PCP:  No PCP Per Patient  Cardiologist:   Kathlyn Sacramento, MD   Chief Complaint  Patient presents with  . other    12 month follow up. Meds reviewed by the pt. verbally. "doing well."       History of Present Illness: Hailey Wilkins is a 46 y.o. female who presents for a followup visit regarding inappropriate sinus tachycardia. She did have pleuritic chest pain and dyspnea with negative cardiac evaluation in 2013. She underwent a treadmill stress test which showed no evidence of ischemia. Echocardiogram was normal.  She has been doing very well with no chest pain, shortness of breath or palpitations.  She had breast reduction surgery last year for back pain with subsequent improvement.  Past Medical History:  Diagnosis Date  . Allergic rhinitis    Dr. Doreene Nest, s/p allergy testing  . Anemia   . Asthma   . Basal cell carcinoma of anterior chest    removed  . Breast cyst 2011   bx benign  . Depression    after father died, on [place] in past  . GERD (gastroesophageal reflux disease)   . Gestational diabetes    with first child  . Headache(784.0)   . History of chicken pox   . Internal hemorrhoid 2006   dx during colon w/Dr Tiffany Kocher  . Migraines    associated with menses in past, tx with excedrin, may last up to 1 week  . Migraines   . Pericarditis 12/13  . Shingles 04/2011  . Sinusitis   . UTI (lower urinary tract infection)     Past Surgical History:  Procedure Laterality Date  . BASAL CELL CARCINOMA EXCISION  2011   removed [place] [facility]  . BREAST CYST ASPIRATION Left 2011   neg  . CHOLECYSTECTOMY  2010   [facility]  Marland Kitchen COLONOSCOPY    . INTRAUTERINE DEVICE INSERTION  01/2011   Dr Sabra Heck  . KNEE ARTHROSCOPY W/ MENISCAL REPAIR  1989   Right, Dr. Mauri Pole  . NASAL SINUS SURGERY  1999, 2005  . TONSILLECTOMY  1988  .  VAGINAL DELIVERY     x 2     Current Outpatient Prescriptions  Medication Sig Dispense Refill  . ADVAIR DISKUS 250-50 MCG/DOSE AEPB INHALE 1 PUFF AS DIRECTED TWICE A DAY  1  . albuterol (VENTOLIN HFA) 108 (90 BASE) MCG/ACT inhaler Inhale 2 puffs into the lungs every 6 (six) hours as needed. For shortness of breath    . aspirin-acetaminophen-caffeine (EXCEDRIN MIGRAINE) 250-250-65 MG per tablet Take 1 tablet by mouth every 6 (six) hours as needed.    . cetirizine (ZYRTEC) 10 MG tablet Take 10 mg by mouth daily.    Marland Kitchen FLUoxetine (PROZAC) 20 MG capsule Take 1 capsule by mouth daily.  3  . fluticasone (FLONASE) 50 MCG/ACT nasal spray Place 2 sprays into the nose as needed.     Marland Kitchen levonorgestrel (MIRENA) 20 MCG/24HR IUD 1 Intra Uterine Device (1 each total) by Intrauterine route once.    . metoprolol succinate (TOPROL-XL) 50 MG 24 hr tablet TAKE 1 TABLET (50 MG TOTAL) BY MOUTH DAILY. TAKE WITH OR IMMEDIATELY FOLLOWING A MEAL. 90 tablet 3  . Multiple Vitamin (MULTIVITAMIN WITH MINERALS) TABS Take 1 tablet by mouth daily.    . Naltrexone-Bupropion HCl ER (CONTRAVE) 8-90 MG TB12 Take 2 tablets by mouth 2 (  two) times daily. 120 tablet 1  . valACYclovir (VALTREX) 1000 MG tablet TAKE AS DIRECTED 30 tablet 1   No current facility-administered medications for this visit.     Allergies:   Amoxicillin; Ceftriaxone; Erythromycin; Levofloxacin; Rocephin [ceftriaxone sodium in dextrose]; Shellfish allergy; Tetracyclines & related; Amoxicillin-pot clavulanate; Clindamycin/lincomycin; Codeine; Latex; Levaquin [levofloxacin]; and Sulfa antibiotics    Social History:  The patient  reports that she has never smoked. She has never used smokeless tobacco. She reports that she does not drink alcohol or use drugs.   Family History:  The patient's family history includes Alcohol abuse in her maternal grandfather and paternal grandfather; Arthritis in her mother; Breast cancer in her cousin and maternal aunt; Breast  cancer (age of onset: 38) in her mother; Cancer in her maternal grandmother; Cancer (age of onset: 68) in her sister; Cancer (age of onset: 61) in her mother; Cancer (age of onset: 70) in her father; Diabetes in her father; Heart disease in her father and paternal grandfather; Hyperlipidemia in her father and paternal grandfather; Hypertension in her father, mother, and paternal grandfather; Other in her paternal grandmother.    ROS:  Please see the history of present illness.   Otherwise, review of systems are positive for none.   All other systems are reviewed and negative.    PHYSICAL EXAM: VS:  BP 110/62 (BP Location: Left Arm, Patient Position: Sitting, Cuff Size: Normal)   Pulse 63   Ht 5\' 2"  (1.575 m)   Wt 172 lb (78 kg)   BMI 31.46 kg/m  , BMI Body mass index is 31.46 kg/m. GEN: Well nourished, well developed, in no acute distress  HEENT: normal  Neck: no JVD, carotid bruits, or masses Cardiac: RRR; no murmurs, rubs, or gallops,no edema  Respiratory:  clear to auscultation bilaterally, normal work of breathing GI: soft, nontender, nondistended, + BS MS: no deformity or atrophy  Skin: warm and dry, no rash Neuro:  Strength and sensation are intact Psych: euthymic mood, full affect   EKG:  EKG is ordered today. The ekg ordered today demonstrates normal sinus rhythm with no significant or T wave changes.   Recent Labs: No results found for requested labs within last 8760 hours.    Lipid Panel    Component Value Date/Time   CHOL 132 11/04/2015   TRIG 68 11/04/2015   HDL 51 11/04/2015   CHOLHDL 2.7 12/09/2014 1509   VLDL 18 12/09/2014 1509   LDLCALC 67 11/04/2015      Wt Readings from Last 3 Encounters:  01/06/17 172 lb (78 kg)  12/01/16 170 lb 9.6 oz (77.4 kg)  06/20/16 175 lb (79.4 kg)       No flowsheet data found.    ASSESSMENT AND PLAN:  1.  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 Continue same treatment and follow-up on a yearly basis.    Disposition:   FU with me in 1 year  Signed,  Kathlyn Sacramento, MD  01/06/2017 11:20 AM    [place]

## 2017-01-06 NOTE — Patient Instructions (Signed)
Medication Instructions: Continue same medications.   Labwork: None.   Procedures/Testing: None.   Follow-Up: 1 year with Dr. Arida.   Any Additional Special Instructions Will Be Listed Below (If Applicable).     If you need a refill on your cardiac medications before your next appointment, please call your pharmacy.   

## 2017-01-18 ENCOUNTER — Ambulatory Visit: Payer: BLUE CROSS/BLUE SHIELD

## 2017-01-18 ENCOUNTER — Encounter: Payer: Self-pay | Admitting: Registered Nurse

## 2017-01-18 ENCOUNTER — Ambulatory Visit: Payer: BLUE CROSS/BLUE SHIELD | Admitting: Registered Nurse

## 2017-01-18 VITALS — BP 110/82 | HR 101 | Temp 99.2°F | Resp 16 | Ht 64.0 in | Wt 160.0 lb

## 2017-01-18 DIAGNOSIS — R69 Illness, unspecified: Secondary | ICD-10-CM

## 2017-01-18 DIAGNOSIS — J4521 Mild intermittent asthma with (acute) exacerbation: Secondary | ICD-10-CM

## 2017-01-18 DIAGNOSIS — J111 Influenza due to unidentified influenza virus with other respiratory manifestations: Secondary | ICD-10-CM

## 2017-01-18 DIAGNOSIS — J0101 Acute recurrent maxillary sinusitis: Secondary | ICD-10-CM

## 2017-01-18 DIAGNOSIS — J301 Allergic rhinitis due to pollen: Secondary | ICD-10-CM

## 2017-01-18 MED ORDER — SALINE SPRAY 0.65 % NA SOLN: 2.0000 | NASAL | 0 refills | Status: DC

## 2017-01-18 MED ORDER — PREDNISONE 20 MG PO TABS: ORAL_TABLET | ORAL | 0 refills | Status: DC

## 2017-01-18 MED ORDER — OSELTAMIVIR PHOSPHATE 75 MG PO CAPS: 75.0000 mg | ORAL_CAPSULE | Freq: Two times a day (BID) | ORAL | 0 refills | Status: AC

## 2017-01-18 MED ORDER — ALBUTEROL SULFATE HFA 108 (90 BASE) MCG/ACT IN AERS: 2.0000 | INHALATION_SPRAY | Freq: Four times a day (QID) | RESPIRATORY_TRACT | 0 refills | Status: DC | PRN

## 2017-01-18 NOTE — Patient Instructions (Addendum)
Shower twice a day Prednisone taper with breakfast x 1 week Hydrate Consider mask if high pollen day/stay inside Premedicate with albuterol inhaler refilled morning/night x 2 days Infectious/stay home x 24 hours off tylenol/motrin until temperature normal less than 100F  Allergic Rhinitis Allergic rhinitis is when the mucous membranes in the nose respond to allergens. Allergens are particles in the air that cause your body to have an allergic reaction. This causes you to release allergic antibodies. Through a chain of events, these eventually cause you to release histamine into the blood stream. Although meant to protect the body, it is this release of histamine that causes your discomfort, such as frequent sneezing, congestion, and an itchy, runny nose. What are the causes? Seasonal allergic rhinitis (hay fever) is caused by pollen allergens that may come from grasses, trees, and weeds. Year-round allergic rhinitis (perennial allergic rhinitis) is caused by allergens such as house dust mites, pet dander, and mold spores. What are the signs or symptoms?  Nasal stuffiness (congestion).  Itchy, runny nose with sneezing and tearing of the eyes. How is this diagnosed? Your health care provider can help you determine the allergen or allergens that trigger your symptoms. If you and your health care provider are unable to determine the allergen, skin or blood testing may be used. Your health care provider will diagnose your condition after taking your health history and performing a physical exam. Your health care provider may assess you for other related conditions, such as asthma, pink eye, or an ear infection. How is this treated? Allergic rhinitis does not have a cure, but it can be controlled by:  Medicines that block allergy symptoms. These may include allergy shots, nasal sprays, and oral antihistamines.  Avoiding the allergen. Hay fever may often be treated with antihistamines in pill or  nasal spray forms. Antihistamines block the effects of histamine. There are over-the-counter medicines that may help with nasal congestion and swelling around the eyes. Check with your health care provider before taking or giving this medicine. If avoiding the allergen or the medicine prescribed do not work, there are many new medicines your health care provider can prescribe. Stronger medicine may be used if initial measures are ineffective. Desensitizing injections can be used if medicine and avoidance does not work. Desensitization is when a patient is given ongoing shots until the body becomes less sensitive to the allergen. Make sure you follow up with your health care provider if problems continue. Follow these instructions at home: It is not possible to completely avoid allergens, but you can reduce your symptoms by taking steps to limit your exposure to them. It helps to know exactly what you are allergic to so that you can avoid your specific triggers. Contact a health care provider if:  You have a fever.  You develop a cough that does not stop easily (persistent).  You have shortness of breath.  You start wheezing.  Symptoms interfere with normal daily activities. This information is not intended to replace advice given to you by your health care provider. Make sure you discuss any questions you have with your health care provider. Document Released: 08/02/2001 Document Revised: 07/08/2016 Document Reviewed: 07/15/2013 Elsevier Interactive Patient Education  2017 [place]. Asthma, Adult Asthma is a recurring condition in which the airways tighten and narrow. Asthma can make it difficult to breathe. It can cause coughing, wheezing, and shortness of breath. Asthma episodes, also called asthma attacks, range from minor to life-threatening. Asthma cannot be cured, but medicines  and lifestyle changes can help control it. What are the causes? Asthma is believed to be caused by inherited  (genetic) and environmental factors, but its exact cause is unknown. Asthma may be triggered by allergens, lung infections, or irritants in the air. Asthma triggers are different for each person. Common triggers include:  Animal dander.  Dust mites.  Cockroaches.  Pollen from trees or grass.  Mold.  Smoke.  Air pollutants such as dust, household cleaners, hair sprays, aerosol sprays, paint fumes, strong chemicals, or strong odors.  Cold air, weather changes, and winds (which increase molds and pollens in the air).  Strong emotional expressions such as crying or laughing hard.  Stress.  Certain medicines (such as aspirin) or types of drugs (such as beta-blockers).  Sulfites in foods and drinks. Foods and drinks that may contain sulfites include dried fruit, potato chips, and sparkling grape juice.  Infections or inflammatory conditions such as the flu, a cold, or an inflammation of the nasal membranes (rhinitis).  Gastroesophageal reflux disease (GERD).  Exercise or strenuous activity. What are the signs or symptoms? Symptoms may occur immediately after asthma is triggered or many hours later. Symptoms include:  Wheezing.  Excessive nighttime or early morning coughing.  Frequent or severe coughing with a common cold.  Chest tightness.  Shortness of breath. How is this diagnosed? The diagnosis of asthma is made by a review of your medical history and a physical exam. Tests may also be performed. These may include:  Lung function studies. These tests show how much air you breathe in and out.  Allergy tests.  Imaging tests such as X-rays. How is this treated? Asthma cannot be cured, but it can usually be controlled. Treatment involves identifying and avoiding your asthma triggers. It also involves medicines. There are 2 classes of medicine used for asthma treatment:  Controller medicines. These prevent asthma symptoms from occurring. They are usually taken every  day.  Reliever or rescue medicines. These quickly relieve asthma symptoms. They are used as needed and provide short-term relief. Your health care provider will help you create an asthma action plan. An asthma action plan is a written plan for managing and treating your asthma attacks. It includes a list of your asthma triggers and how they may be avoided. It also includes information on when medicines should be taken and when their dosage should be changed. An action plan may also involve the use of a device called a peak flow meter. A peak flow meter measures how well the lungs are working. It helps you monitor your condition. Follow these instructions at home:  Take medicines only as directed by your health care provider. Speak with your health care provider if you have questions about how or when to take the medicines.  Use a peak flow meter as directed by your health care provider. Record and keep track of readings.  Understand and use the action plan to help minimize or stop an asthma attack without needing to seek medical care.  Control your home environment in the following ways to help prevent asthma attacks:  Do not smoke. Avoid being exposed to secondhand smoke.  Change your heating and air conditioning filter regularly.  Limit your use of fireplaces and wood stoves.  Get rid of pests (such as roaches and mice) and their droppings.  Throw away plants if you see mold on them.  Clean your floors and dust regularly. Use unscented cleaning products.  Try to have someone else vacuum for you  regularly. Stay out of rooms while they are being vacuumed and for a short while afterward. If you vacuum, use a dust mask from a hardware store, a double-layered or microfilter vacuum cleaner bag, or a vacuum cleaner with a HEPA filter.  Replace carpet with wood, tile, or vinyl flooring. Carpet can trap dander and dust.  Use allergy-proof pillows, mattress covers, and box spring  covers.  Wash bed sheets and blankets every week in hot water and dry them in a dryer.  Use blankets that are made of polyester or cotton.  Clean bathrooms and kitchens with bleach. If possible, have someone repaint the walls in these rooms with mold-resistant paint. Keep out of the rooms that are being cleaned and painted.  Wash hands frequently. Contact a health care provider if:  You have wheezing, shortness of breath, or a cough even if taking medicine to prevent attacks.  The colored mucus you cough up (sputum) is thicker than usual.  Your sputum changes from clear or white to yellow, green, gray, or bloody.  You have any problems that may be related to the medicines you are taking (such as a rash, itching, swelling, or trouble breathing).  You are using a reliever medicine more than 2-3 times per week.  Your peak flow is still at 50-79% of your personal best after following your action plan for 1 hour.  You have a fever. Get help right away if:  You seem to be getting worse and are unresponsive to treatment during an asthma attack.  You are short of breath even at rest.  You get short of breath when doing very little physical activity.  You have difficulty eating, drinking, or talking due to asthma symptoms.  You develop chest pain.  You develop a fast heartbeat.  You have a bluish color to your lips or fingernails.  You are light-headed, dizzy, or faint.  Your peak flow is less than 50% of your personal best. This information is not intended to replace advice given to you by your health care provider. Make sure you discuss any questions you have with your health care provider. Document Released: 11/07/2005 Document Revised: 04/20/2016 Document Reviewed: 06/06/2013 Elsevier Interactive Patient Education  2017 [employer] Sinusitis, Adult Sinusitis is soreness and inflammation of your sinuses. Sinuses are hollow spaces in the bones around your face. They are  located:  Around your eyes.  In the middle of your forehead.  Behind your nose.  In your cheekbones. Your sinuses and nasal passages are lined with a stringy fluid (mucus). Mucus normally drains out of your sinuses. When your nasal tissues get inflamed or swollen, the mucus can get trapped or blocked so air cannot flow through your sinuses. This lets bacteria, viruses, and funguses grow, and that leads to infection. Follow these instructions at home: Medicines   Take, use, or apply over-the-counter and prescription medicines only as told by your doctor. These may include nasal sprays.  If you were prescribed an antibiotic medicine, take it as told by your doctor. Do not stop taking the antibiotic even if you start to feel better. Hydrate and Humidify   Drink enough water to keep your pee (urine) clear or pale yellow.  Use a cool mist humidifier to keep the humidity level in your home above 50%.  Breathe in steam for 10-15 minutes, 3-4 times a day or as told by your doctor. You can do this in the bathroom while a hot shower is running.  Try not to  spend time in cool or dry air. Rest   Rest as much as possible.  Sleep with your head raised (elevated).  Make sure to get enough sleep each night. General instructions   Put a warm, moist washcloth on your face 3-4 times a day or as told by your doctor. This will help with discomfort.  Wash your hands often with soap and water. If there is no soap and water, use hand sanitizer.  Do not smoke. Avoid being around people who are smoking (secondhand smoke).  Keep all follow-up visits as told by your doctor. This is important. Contact a doctor if:  You have a fever.  Your symptoms get worse.  Your symptoms do not get better within 10 days. Get help right away if:  You have a very bad headache.  You cannot stop throwing up (vomiting).  You have pain or swelling around your face or eyes.  You have trouble seeing.  You feel  confused.  Your neck is stiff.  You have trouble breathing. This information is not intended to replace advice given to you by your health care provider. Make sure you discuss any questions you have with your health care provider. Document Released: 04/25/2008 Document Revised: 07/03/2016 Document Reviewed: 09/02/2015 Elsevier Interactive Patient Education  2017 [place] Influenza, Adult Avian influenza is also known as bird flu. Avian influenza is a group of viruses that occur naturally in wild and domestic birds. Avian influenza is easily spread (contagious) among birds and is deadly to them. Birds become infected when they come into contact with infected birds or contaminated surfaces. The avian influenza viruses are spread from country to country through the international poultry trade or by migrating birds. Although rare, avian influenza can cause severe illness in humans. It is also rare for there to be human-to-human transmission of avian influenza. However, influenza viruses can change, so human-to-human transmission may become more likely in the future. What are the causes? This condition is caused by infected birds that spread avian influenza through their feces, saliva, and fluids from the nose (nasal secretions). Avian influenza can then infect humans who:  Come in contact with infected birds. This can happen with a bird that is dead or alive.  Breathe in contaminated dust.  Touch contaminated surfaces. What increases the risk? This condition is more likely to develop in people who come in close contact with birds or poultry. What are the signs or symptoms? Symptoms of this condition include:  Fever.  Cough.  Sore throat.  Nausea and vomiting.  Diarrhea.  Muscle aches.  Tiredness (fatigue).  Inflammation or redness of the eyes (conjunctivitis).  Shortness of breath.  Difficulty breathing.  Pain in the abdomen.  Seizures.  Mental  confusion. How is this diagnosed? This condition may be diagnosed by:  Medical history and physical exam.  Chest X-ray.  Examination of a fluid sample from your throat or nose.  Blood tests. How is this treated? This condition may be treated by:  Medicines that stop the growth of the viruses in your body (antiviral medicines).  Supportive care to relieve your symptoms. Follow these instructions at home:  Take over-the-counter and prescription medicines only as told by your health care provider.  Use a cool mist humidifier. This makes breathing easier.  Rest as directed by your health care provider.  Drink enough fluid to keep your urine clear or pale yellow.  Cover your mouth and nose when coughing or sneezing.  Wash your hands  well to prevent the viruses from spreading.  Avoid crowded areas. Stay home from work or school until directed by your health care provider. How is this prevented?  Stay home from work and school when you are sick. Not being in contact with other people will help stop the spread of illness.  Cover your mouth and nose with your arm when coughing or sneezing. This may help keep those around you from getting sick.  Wash your hands often with warm water and soap. Illnesses are often spread when a person touches something that is contaminated with germs and then touches his or her eyes, nose, or mouth.  If you think that you have been exposed to avian influenza, ask your health care provider about preventive antiviral medicines. These can help prevent infection from occurring. Contact a health care provider if:  You have a fever.  You have a skin rash.  You have new symptoms.  Your symptoms get worse.  You are urinating noticeably less or not at all.  Your symptoms do not get better with treatment. Get help right away if:  Your skin or nails turn bluish.  You have chest pain.  You have trouble breathing.  You feel like your heart is  fluttering, skipping a beat, or beating faster than normal.  You become confused.  You have chills, weakness, or light-headedness.  You develop a sudden headache.  You develop pain in your face or ear.  You have severe pain or stiffness in your neck.  You cough up blood.  You have nausea and vomiting that you cannot control. This information is not intended to replace advice given to you by your health care provider. Make sure you discuss any questions you have with your health care provider. Document Released: 11/10/2003 Document Revised: 06/01/2016 Document Reviewed: 04/16/2016 Elsevier Interactive Patient Education  2017 [employer].

## 2017-01-18 NOTE — Progress Notes (Signed)
Subjective:    Patient ID: Hailey Wilkins, female    DOB: June 20, 1971, 46 y.o.   MRN: [id number]  45y/o caucasian female here for evaluation sore throat, sinus pressure, fever, chest tightness, productive cough.  Feels like I have sinus infection usually treat with po steroids since I am allergic to so many antibiotics.  Using flonase without any relief.  + myalgias, + fatigue also.  Denied vomiting, nausea.  Tried dayquil, nyquil, mucinex helps a little but wears off too quickly. Would also like refill on her albuterol inhaler for mild asthma since childhood uses more often during allergy season.  Singulair tried approximately 10 years ago didn't help. Review of patient's family history indicates:   Hypertension                   Mother                   Cancer                         Mother                     Comment: Breast Ca   Arthritis                      Mother                   Breast cancer                  Mother                   Cancer                         Father                     Comment: Pancreatic   Diabetes                       Father                   Hypertension                   Father                   Hyperlipidemia                 Father                   Heart disease                  Father                   Cancer                         Sister                     Comment: Thyroid   Cancer                         Maternal Grandmother       Comment: Stomach - 90's   Alcohol abuse  Maternal Grandfather     Alcohol abuse                  Paternal Grandfather     Heart disease                  Paternal Grandfather     Hyperlipidemia                 Paternal Grandfather     Hypertension                   Paternal Grandfather     Other                          Paternal Grandmother       Comment: brain tumor on pitutary gland   Breast cancer                  Maternal Aunt            Breast cancer                  Cousin                      Comment: mat cousin Past Medical History: No date: Allergic rhinitis     Comment: Dr. Doreene Nest, s/p allergy testing No date: Anemia No date: Asthma No date: Basal cell carcinoma of anterior chest     Comment: removed 2011: Breast cyst     Comment: bx benign No date: Depression     Comment: after father died, on [place] in past No date: GERD (gastroesophageal reflux disease) No date: Gestational diabetes     Comment: with first child No date: Headache(784.0) No date: History of chicken pox 2006: Internal hemorrhoid     Comment: dx during colon w/Dr Tiffany Kocher No date: Migraines     Comment: associated with menses in past, tx with               excedrin, may last up to 1 week No date: Migraines 12/13: Pericarditis 04/2011: Shingles No date: Sinusitis No date: UTI (lower urinary tract infection) Past Surgical History: 2011: BASAL CELL CARCINOMA EXCISION     Comment: removed from chest/[facility] 2011: BREAST CYST ASPIRATION Left     Comment: neg 2010: CHOLECYSTECTOMY     Comment: [facility] No date: COLONOSCOPY 01/2011: INTRAUTERINE DEVICE INSERTION     Comment: Dr Sabra Heck 1989: KNEE ARTHROSCOPY W/ MENISCAL REPAIR     Comment: Right, Dr. Mauri Pole 1999, 2005: [facility]: TONSILLECTOMY No date: VAGINAL DELIVERY     Comment: x 2      Review of Systems     Objective:   Physical Exam    Vitals:   01/18/17 1348  BP: 110/82  Pulse: (!) 101  Resp: 16  Temp: 99.2 F (37.3 C)      Assessment & Plan:  A-influenza like illness, recurrent acute maxillary sinusitis, seasonal allergic rhinitis, asthma mild intermittent  P-Discussed pulse increased nasal drip/congestion/mouth breathing can increase fluid losses up to a liter per day.  Hydrate to keep urine pale yellow.  Electronic Rx tamiflu 75mg  po BID x 5 days #10 RF0 Wash hands, do not touch face.   Hydrate hydrate hydrate.if unable to maintain po intake or urine orange/brown/unable to  void every 8 hours follow up with PCM/UCC/ER for re-evaluation.  Tylenol 1000mg  po q6h prn pain/fever.  cough lozenges po q2h prn cough.  Honey with lemon.  Avoid motrin/advil/aleve/naproxen due to diabetes and hypertension.  Suspect Viral illness: no evidence of invasive bacterial infection, non toxic and well hydrated.  This is most likely self limiting viral infection.  I do not see where any further testing or imaging is necessary at this time.   I will suggest supportive care, rest, good hygiene and encourage the patient to take adequate fluids.  Does not require work excuse. If fever greater than 100.58F Patient to talk with supervisor discussed recommend staying home until afebrile 24 hours off tylenol/motrin. nasal saline 1-2 sprays each nostril prn q2h,.  Discussed honey with lemon and salt water gargles for comfort also.  The patient is to return to clinic or EMERGENCY ROOM if symptoms worsen or change significantly e.g. Dyspnea, dysphagia, vomiting, lethargy, SOB, wheezing. Patient verbalized agreement and understanding of treatment plan and had no further questions at this time.  Bronchitis simple, community acquired, may have started as viral (probably respiratory syncytial, parainfluenza, influenza, or adenovirus), but now evidence of acute purulent bronchitis with resultant bronchial edema and mucus formation.  Viruses are the most common cause of bronchial inflammation in otherwise healthy adults with acute bronchitis.  The appearance of sputum is not predictive of whether a bacterial infection is present.  Purulent sputum is most often caused by viral infections.  There are a small portion of those caused by non-viral agents being Mycoplamsa pneumonia.  Microscopic examination or C&S of sputum in the healthy adult with acute bronchitis is generally not helpful (usually negative or normal respiratory flora) other considerations being cough from upper respiratory tract infections, sinusitis or  allergic syndromes (mild asthma or viral pneumonia).  Differential Diagnosis:  reactive airway disease (asthma, allergic aspergillosis (eosinophilia), chronic bronchitis, respiratory infection (Sinusitis, Common cold, pneumonia), congestive heart failure, reflux esophagitis, bronchogenic tumor, aspiration syndromes and/or exposure irritants/tobacco smoke.  In this case, there is no evidence of any invasive bacterial illness.  Most likely viral etiology so will hold on antibiotic treatment.  Advise supportive care with rest, encourage fluids, good hygiene and watch for any worsening symptoms.  If they were to develop:  come back to the office or go to the emergency room if after hours. Without high fever, severe dyspnea, lack of physical findings or other risk factors, I will hold on a chest radiograph and CBC at this time. I discussed that approximately 50% of patients with acute bronchitis have a cough that lasts up to three weeks, and 25% for over a month.  Tylenol, one to two tablets every four hours as needed for fever or myalgias.   No aspirin.  Patient instructed to follow up in one week or sooner if symptoms worsen. Patient verbalized agreement and understanding of treatment plan.  P2:  hand washing and cover cough  Continue flonase 1 spray each nostril BID, add nasal saline 2 sprays each nostril q2h prn congestion.  Prednisone taper with breakfast daily x 7 days 20mg  tabs (60/50/40/30/20/10mg ) #21 RF0 Rx given.  No evidence of systemic bacterial infection, non toxic and well hydrated.  I do not see where any further testing or imaging is necessary at this time.   I will suggest supportive care, rest, good hygiene and encourage the patient to take adequate fluids.  The patient is to return to clinic or EMERGENCY ROOM if symptoms worsen or change significantly.  Exitcare handout on sinusitis given to patient.  Patient verbalized agreement and understanding of treatment plan and  had no further questions at  this time.   P2:  Hand washing and cover cough  Patient may use normal saline nasal spray as needed.  Continue antihistamine or nasal steroid use.  Avoid triggers if possible.  Shower BID especially prior to bedtime if exposed to triggers.  If allergic dust/dust mites recommend mattress/pillow covers/encasements; washing linens, vacuuming, sweeping, dusting weekly.  Call or return to clinic as needed if these symptoms worsen or fail to improve as anticipated.   Exitcare handout on allergic rhinitis given to patient.  Patient verbalized understanding of instructions, agreed with plan of care and had no further questions at this time.  P2:  Avoidance and hand washing.  Refilled albuterol 1-2 puffs po q4-6h prn protracted cough/wheezing #1 RF0.  Medications as directed.  Patient is to return to the clinic or follow up with PCM if there is increased wheezing or shortness of breath, increased use of albuterol.    Patient given Exitcare handout on asthma.  Patient verbalized agreement and understanding of treatment plan.  P2:  fluids, and fitness

## 2017-01-23 ENCOUNTER — Ambulatory Visit: Payer: BLUE CROSS/BLUE SHIELD

## 2017-01-23 ENCOUNTER — Ambulatory Visit
Admission: RE | Admit: 2017-01-23 | Discharge: 2017-01-23 | Disposition: A | Discharge status: Home / Self Care | Payer: BLUE CROSS/BLUE SHIELD | Source: Ambulatory Visit | Attending: Medical | Admitting: Medical

## 2017-01-23 ENCOUNTER — Encounter: Payer: Self-pay | Admitting: Medical

## 2017-01-23 ENCOUNTER — Ambulatory Visit: Payer: Self-pay | Admitting: Medical

## 2017-01-23 VITALS — BP 122/80 | HR 64 | Temp 98.8°F | Resp 16 | Ht 62.0 in | Wt 170.0 lb

## 2017-01-23 DIAGNOSIS — R059 Cough, unspecified: Secondary | ICD-10-CM

## 2017-01-23 DIAGNOSIS — R509 Fever, unspecified: Secondary | ICD-10-CM

## 2017-01-23 DIAGNOSIS — R05 Cough: Secondary | ICD-10-CM

## 2017-01-23 DIAGNOSIS — J40 Bronchitis, not specified as acute or chronic: Secondary | ICD-10-CM

## 2017-01-23 MED ORDER — PREDNISONE 20 MG PO TABS: ORAL_TABLET | ORAL | 0 refills | Status: DC

## 2017-01-23 MED ORDER — BENZONATATE 100 MG PO CAPS: 100.0000 mg | ORAL_CAPSULE | Freq: Three times a day (TID) | ORAL | 0 refills | Status: DC

## 2017-01-23 MED ORDER — ALBUTEROL SULFATE (2.5 MG/3ML) 0.083% IN NEBU: 2.5000 mg | INHALATION_SOLUTION | Freq: Once | RESPIRATORY_TRACT | Status: DC

## 2017-01-23 MED ORDER — ADVAIR DISKUS 250-50 MCG/DOSE IN AEPB: INHALATION_SPRAY | RESPIRATORY_TRACT | 1 refills | Status: DC

## 2017-01-23 MED ORDER — ALBUTEROL SULFATE (2.5 MG/3ML) 0.083% IN NEBU: 2.5000 mg | INHALATION_SOLUTION | Freq: Four times a day (QID) | RESPIRATORY_TRACT | 0 refills | Status: DC | PRN

## 2017-01-23 NOTE — Progress Notes (Signed)
   Subjective:    Patient ID: Hailey Wilkins, female    DOB: 1971/11/14, 46 y.o.   MRN: [id number]  HPI Patient returns to clinic with cough productive green. She was seen on 01/18/2017 diagnosed with influenza A -like, viral bronchitis,  And allergic rhinitis . Placed on tamiflu,  And steroid taper and a refill of her proair.  Cough continued to worsen. She is allergic to "all" antibiotics with hives to anaphylaxis reactions. Worried she may have pneumonia. Coughing a lot.  Pt says her fever came back after taking the Tamiflu but now is gone again.    Review of Systems  No recent fever. HEENT, no sore throat, no ear pain. Lungs feels tight in chest, using her albuterol (proair) every  6 hours.  Heart no complaints. Skin no rashes or hives.      Objective:   Physical Exam  Non acute distress normocephalic atramatic HEENT TM wnl, pharynx with mildly swollen uvula and cobblestoning posterior pharnyx Nares with mild erythema no turbinate swelling  Lungs Coughing a lot in room. diminshed BS right base no wheezing or rhonchi Heart rrr, slightly tachy no murmurs rubs or gallops Adenopathy mild cervical chain noted.        Assessment & Plan:  Bronchitis with possible pneumonia, will send patient for Chest xray and call pt with results. Cough - given albuterol nebulizer in clinic (0.083%/31ml). Helped with cough. Breath sounds improved. Prescribed albuterol ampules for her own nebulizer at home, she states her inhaler is not really working. Reviewed cough medication with patient she is allergic to codeine, so reviewed with patient benzonatate perles. She recalls no allergy to these and is willing to try them, thinks she had them 8-10 yrs ago.,If she has any problems to contact me if mild reaction. If Shortness of Breath or moderate to servere allergic reaction to dial 911 and to be evaluated by the Emergency Department. To take otc non-sedative antihistimine to help dry up mucus (ie  zyrtec or claritin). Steroid dose pak taper starting with 60mg . Take as directed. Return to the clinic in  3-5 days if not improving or worsening.

## 2017-01-25 ENCOUNTER — Other Ambulatory Visit: Payer: Self-pay | Admitting: *Deleted

## 2017-01-25 MED ORDER — METOPROLOL SUCCINATE ER 50 MG PO TB24
50.0000 mg | ORAL_TABLET | Freq: Every day | ORAL | 3 refills | Status: DC
Start: 2017-01-25 — End: 2018-07-17

## 2017-01-30 ENCOUNTER — Ambulatory Visit: Payer: BLUE CROSS/BLUE SHIELD | Admitting: Medical

## 2017-01-30 ENCOUNTER — Encounter: Payer: Self-pay | Admitting: Medical

## 2017-01-30 VITALS — BP 100/60 | HR 85 | Temp 99.2°F | Resp 16 | Ht 62.0 in | Wt 170.0 lb

## 2017-01-30 DIAGNOSIS — J111 Influenza due to unidentified influenza virus with other respiratory manifestations: Secondary | ICD-10-CM

## 2017-01-30 MED ORDER — OSELTAMIVIR PHOSPHATE 75 MG PO CAPS: 75.0000 mg | ORAL_CAPSULE | Freq: Two times a day (BID) | ORAL | 0 refills | Status: AC

## 2017-01-30 NOTE — Progress Notes (Signed)
   Subjective:    Patient ID: Hailey Wilkins, female    DOB: 10/31/1971, 46 y.o.   MRN: [id number]  HPI Pt started feeling bad on Friday night. Saturday and Sunday stayed in bed. Complains of bodyaches, st, headache and fatigue. Still feels tired today. Also complaining of bilateral ear pain, took Mucinex DM and tylenol regular strength x 2 tablets today at  6am. Allergies to 11 different antibiotics and shellfish, latex, codeine.    Review of Systems  Constitutional: Positive for fever.  HENT: Positive for rhinorrhea and sore throat.   Respiratory: Negative for shortness of breath.   Musculoskeletal: Positive for back pain and myalgias.       Objective:   Physical Exam  Constitutional: She is oriented to person, place, and time. She appears well-developed and well-nourished.  HENT:  Head: Normocephalic and atraumatic.  Right Ear: External ear normal.  Left Ear: External ear normal.  Eyes: Conjunctivae and EOM are normal. Pupils are equal, round, and reactive to light.  Neck: Normal range of motion.  Cardiovascular: Normal rate and regular rhythm.  Exam reveals no gallop and no friction rub.   No murmur heard. Pulmonary/Chest: Effort normal. No respiratory distress. She has no wheezes.  Lymphadenopathy:    She has cervical adenopathy.  Neurological: She is alert and oriented to person, place, and time.  Skin: Skin is warm and dry.  Psychiatric: She has a normal mood and affect. Her behavior is normal.  Ears dk and fluid noted behind TM bilaterally.R>L Nares clear d/c , blood medial side of left nare. No edema.        Assessment & Plan:  Influenza -tamiflu 75mg  one po twice daily x 5 days. No refills. Rest, increase fluids, continue otc motrin and tylenol take as directed.  Continue otc Mucinex DM take as directed. Return to the clinic as needed.

## 2017-02-12 ENCOUNTER — Other Ambulatory Visit: Payer: Self-pay | Admitting: Obstetrics & Gynecology

## 2017-02-13 NOTE — Telephone Encounter (Signed)
Medication refill request: Valtrex   Last AEX:  02-11-16  Next AEX: 05-23-17  Last MMG (if hormonal medication request): 05-11-16 WNL  Refill authorized: please advise

## 2017-02-16 ENCOUNTER — Telehealth: Payer: Self-pay | Admitting: *Deleted

## 2017-02-16 ENCOUNTER — Encounter: Payer: Self-pay | Admitting: Obstetrics & Gynecology

## 2017-02-16 ENCOUNTER — Ambulatory Visit: Payer: BLUE CROSS/BLUE SHIELD | Admitting: Medical

## 2017-02-16 ENCOUNTER — Encounter: Payer: Self-pay | Admitting: Medical

## 2017-02-16 ENCOUNTER — Ambulatory Visit
Admission: RE | Admit: 2017-02-16 | Discharge: 2017-02-16 | Disposition: A | Discharge status: Home / Self Care | Payer: BLUE CROSS/BLUE SHIELD | Source: Ambulatory Visit | Attending: Medical | Admitting: Medical

## 2017-02-16 VITALS — BP 102/70 | HR 75 | Temp 98.4°F | Resp 16 | Ht 62.5 in | Wt 170.0 lb

## 2017-02-16 DIAGNOSIS — M79632 Pain in left forearm: Secondary | ICD-10-CM | POA: Diagnosis not present

## 2017-02-16 DIAGNOSIS — S51812A Laceration without foreign body of left forearm, initial encounter: Secondary | ICD-10-CM | POA: Diagnosis not present

## 2017-02-16 NOTE — Telephone Encounter (Addendum)
Forwarded to Dr. Talbert Nan in error.  Dr. Sabra Heck , forwarding FYI, any additional recommendations?   From [facility] [place] To Megan Salon, MD Sent 02/16/2017 12:46 PM  Hey Dr. Sabra Heck,  Happy Spring!  I just wanted to let you know that I have stopped taking the Contrave. I did not lose any weight, I mean not a pound and the side effects were awful. I've been reading this book called Metabolism Revolution, and it is supposed to transform your metabolism and jump start weight loss.  I may try doing that. I'll let you know if it works.  Thanks!  Hailey Wilkins

## 2017-02-16 NOTE — Telephone Encounter (Signed)
No additional recommendations needed.

## 2017-02-16 NOTE — Progress Notes (Signed)
   Subjective:    Patient ID: Hailey Wilkins, female    DOB: 28-Apr-1971, 46 y.o.   MRN: [id number]  HPI   46 yo female yesterday stepped on sharp dog bone and moved back and hit left forearm on corner of wall. Pain, swelling and scratch is located on the medial side of left forearm.  Foot is not injured. Last Tetanus immunization Jan 2009.    Review of Systems  Constitutional: Negative.   Eyes: Negative.   Respiratory: Positive for cough.   Cardiovascular: Negative.   Gastrointestinal: Negative.   Genitourinary: Negative.   Musculoskeletal: Negative.   Neurological: Negative.   Psychiatric/Behavioral: Negative.   Denies any numbness or tingling in arm,wrist, hand or fingers.    slight cough recovering from upper respiratory infection, feeling much better. Objective:   Physical Exam  NAD,  PERRLA, EOMI Left medial side of forearm 1/2 inch ,scratch with red mark linear red mark 1/2 long., mild swelling noted at area, no bruising.Fingers with mild swelling able to wear rings.  FROM of wrist, fingers and arm. 2 + radial pulse. Can supinate without difficulty. AxOx3         Assessment & Plan:  Left forearm pain- sent for xray. Reviewed wound care with patient. Given triple antibiotic ointment and bandages. To clean twice daily with dilute soap and water, apply triple antibiotic ointment to site and a bandage to the site. Return to clinic if signs of infection.  Left forearm xray showed no fracture. Called patient with results 1:30pm.  To use ice , elevation , otc motrin take as directed for pain and swelling. Return to the clinic as needed.

## 2017-02-25 ENCOUNTER — Telehealth: Payer: BLUE CROSS/BLUE SHIELD | Admitting: Nurse Practitioner

## 2017-02-25 DIAGNOSIS — B3731 Acute candidiasis of vulva and vagina: Secondary | ICD-10-CM

## 2017-02-25 DIAGNOSIS — B373 Candidiasis of vulva and vagina: Secondary | ICD-10-CM

## 2017-02-25 MED ORDER — FLUCONAZOLE 150 MG PO TABS: 150.0000 mg | ORAL_TABLET | Freq: Once | ORAL | 0 refills | Status: AC

## 2017-02-25 NOTE — Progress Notes (Signed)

## 2017-02-27 DIAGNOSIS — N62 Hypertrophy of breast: Secondary | ICD-10-CM | POA: Diagnosis not present

## 2017-03-14 ENCOUNTER — Other Ambulatory Visit: Payer: Self-pay | Admitting: Obstetrics & Gynecology

## 2017-03-14 NOTE — Telephone Encounter (Signed)
Medication refill request: Prozac  Last AEX:  02-11-16  Next AEX: 05-23-17  Last MMG (if hormonal medication request): 05-11-16 WNL  Refill authorized: please advise

## 2017-04-10 ENCOUNTER — Other Ambulatory Visit: Payer: Self-pay | Admitting: Obstetrics & Gynecology

## 2017-04-10 DIAGNOSIS — Z1231 Encounter for screening mammogram for malignant neoplasm of breast: Secondary | ICD-10-CM

## 2017-04-27 DIAGNOSIS — L910 Hypertrophic scar: Secondary | ICD-10-CM | POA: Diagnosis not present

## 2017-05-04 ENCOUNTER — Encounter: Payer: Self-pay | Admitting: Obstetrics & Gynecology

## 2017-05-04 ENCOUNTER — Ambulatory Visit (INDEPENDENT_AMBULATORY_CARE_PROVIDER_SITE_OTHER): Payer: BLUE CROSS/BLUE SHIELD | Admitting: Obstetrics & Gynecology

## 2017-05-04 VITALS — BP 126/86 | HR 70 | Resp 16 | Ht 62.0 in | Wt 174.0 lb

## 2017-05-04 DIAGNOSIS — Z0000 Encounter for general adult medical examination without abnormal findings: Secondary | ICD-10-CM

## 2017-05-04 DIAGNOSIS — M2550 Pain in unspecified joint: Secondary | ICD-10-CM | POA: Diagnosis not present

## 2017-05-04 DIAGNOSIS — N912 Amenorrhea, unspecified: Secondary | ICD-10-CM

## 2017-05-04 DIAGNOSIS — N6310 Unspecified lump in the right breast, unspecified quadrant: Secondary | ICD-10-CM

## 2017-05-04 DIAGNOSIS — Z842 Family history of other diseases of the genitourinary system: Secondary | ICD-10-CM | POA: Diagnosis not present

## 2017-05-04 DIAGNOSIS — Z01419 Encounter for gynecological examination (general) (routine) without abnormal findings: Secondary | ICD-10-CM

## 2017-05-04 MED ORDER — FLUOXETINE HCL 20 MG PO CAPS: 20.0000 mg | ORAL_CAPSULE | Freq: Every day | ORAL | 3 refills | Status: DC

## 2017-05-04 NOTE — Progress Notes (Deleted)
46 y.o. G2P2 MarriedCaucasianF here for annual exam.    No LMP recorded. Patient is not currently having periods (Reason: IUD).          Sexually active: Yes.    The current method of family planning is IUD. Mirena placed 02/11/16 Exercising: No.  The patient does not participate in regular exercise at present. Smoker:  no  Health Maintenance: Pap:  02/11/16 Neg. HR HPV:neg   12/02/13 Neg  History of abnormal Pap:  Yes, remote hx MMG:  05/11/16 BIRADS1:Neg. Has appt 05/12/17 Colonoscopy:  2006 Normal  BMD:   Never TDaP:  2009 Pneumonia vaccine(s):  2008  Zostavax:   No Hep C testing: N/A Screening Labs: Here   reports that she has never smoked. She has never used smokeless tobacco. She reports that she does not drink alcohol or use drugs.  Past Medical History:  Diagnosis Date  . Allergic rhinitis    Dr. Doreene Nest, s/p allergy testing  . Anemia   . Asthma   . Basal cell carcinoma of anterior chest    removed  . Breast cyst 2011   bx benign  . Depression    after father died, on [place] in past  . GERD (gastroesophageal reflux disease)   . Gestational diabetes    with first child  . Headache(784.0)   . History of chicken pox   . Internal hemorrhoid 2006   dx during colon w/Dr Tiffany Kocher  . Migraines    associated with menses in past, tx with excedrin, may last up to 1 week  . Migraines   . Pericarditis 12/13  . Shingles 04/2011  . Sinusitis   . UTI (lower urinary tract infection)     Past Surgical History:  Procedure Laterality Date  . BASAL CELL CARCINOMA EXCISION  2011   removed from chest/[facility]  . BREAST CYST ASPIRATION Left 2011   neg  . CHOLECYSTECTOMY  2010   [facility]  Marland Kitchen COLONOSCOPY    . INTRAUTERINE DEVICE INSERTION  01/2011   Dr Sabra Heck  . KNEE ARTHROSCOPY W/ MENISCAL REPAIR  1989   Right, Dr. Mauri Pole  . NASAL SINUS SURGERY  1999, 2005  . TONSILLECTOMY  1988  . VAGINAL DELIVERY     x 2    Current Outpatient Prescriptions   Medication Sig Dispense Refill  . albuterol (VENTOLIN HFA) 108 (90 Base) MCG/ACT inhaler Inhale 2 puffs into the lungs every 6 (six) hours as needed for wheezing or shortness of breath. For shortness of breath 1 Inhaler 0  . aspirin-acetaminophen-caffeine (EXCEDRIN MIGRAINE) 250-250-65 MG per tablet Take 1 tablet by mouth every 6 (six) hours as needed.    . cetirizine (ZYRTEC) 10 MG tablet Take 10 mg by mouth daily.    Marland Kitchen FLUoxetine (PROZAC) 20 MG capsule TAKE ONE CAPSULE BY MOUTH EVERY DAY 90 capsule 3  . fluticasone (FLONASE) 50 MCG/ACT nasal spray Place 2 sprays into the nose as needed.     Marland Kitchen levonorgestrel (MIRENA) 20 MCG/24HR IUD 1 Intra Uterine Device (1 each total) by Intrauterine route once.    . metoprolol succinate (TOPROL-XL) 50 MG 24 hr tablet Take 1 tablet (50 mg total) by mouth daily. 90 tablet 3  . sodium chloride (OCEAN) 0.65 % SOLN nasal spray Place 2 sprays into both nostrils every 2 (two) hours while awake. 1 Bottle 0  . valACYclovir (VALTREX) 1000 MG tablet TAKE AS DIRECTED 30 tablet 1   No current facility-administered medications for this visit.  Family History  Problem Relation Age of Onset  . Hypertension Mother   . Cancer Mother 40       Breast Ca  . Arthritis Mother   . Breast cancer Mother 60  . Cancer Father 48       Pancreatic  . Diabetes Father   . Hypertension Father   . Hyperlipidemia Father   . Heart disease Father   . Cancer Sister 43       Thyroid  . Cancer Maternal Grandmother        Stomach - 22's  . Alcohol abuse Maternal Grandfather   . Alcohol abuse Paternal Grandfather   . Heart disease Paternal Grandfather   . Hyperlipidemia Paternal Grandfather   . Hypertension Paternal Grandfather   . Other Paternal Grandmother        brain tumor on pitutary gland  . Breast cancer Maternal Aunt   . Breast cancer Cousin        mat cousin    ROS:  Pertinent items are noted in HPI.  Otherwise, a comprehensive ROS was negative.  Exam:   BP  126/86 (BP Location: Right Arm, Patient Position: Sitting, Cuff Size: Normal)   Pulse 70   Resp 16   Ht 5\' 2"  (1.575 m)   Wt 174 lb (78.9 kg)   BMI 31.83 kg/m   Weight change: @WEIGHTCHANGE @ Height:   Height: 5\' 2"  (157.5 cm)  Ht Readings from Last 3 Encounters:  05/04/17 5\' 2"  (1.575 m)  02/16/17 5' 2.5" (1.588 m)  01/30/17 5\' 2"  (1.575 m)    General appearance: alert, cooperative and appears stated age Head: Normocephalic, without obvious abnormality, atraumatic Neck: no adenopathy, supple, symmetrical, trachea midline and thyroid {EXAM; THYROID:18604} Lungs: clear to auscultation bilaterally Breasts: {Exam; breast:13139::"normal appearance, no masses or tenderness"} Heart: regular rate and rhythm Abdomen: soft, non-tender; bowel sounds normal; no masses,  no organomegaly Extremities: extremities normal, atraumatic, no cyanosis or edema Skin: Skin color, texture, turgor normal. No rashes or lesions Lymph nodes: Cervical, supraclavicular, and axillary nodes normal. No abnormal inguinal nodes palpated Neurologic: Grossly normal   Pelvic: External genitalia:  no lesions              Urethra:  normal appearing urethra with no masses, tenderness or lesions              Bartholins and Skenes: normal                 Vagina: normal appearing vagina with normal color and discharge, no lesions              Cervix: {exam; cervix:14595}              Pap taken: {yes no:314532} Bimanual Exam:  Uterus:  {exam; uterus:12215}              Adnexa: {exam; adnexa:12223}               Rectovaginal: Confirms               Anus:  normal sphincter tone, no lesions  Chaperone was present for exam.  A:  Well Woman with normal exam  P:   {plan; gyn:5269::"mammogram","pap smear","return annually or prn"}

## 2017-05-04 NOTE — Progress Notes (Signed)
46 y.o. G2P2 MarriedCaucasianF here for annual exam.  Doing well.  No vaginal bleeding but has IUD for contraception.  Had breast reduction this past year.  Very pleased with results.  States there was something in the pathology that she wasn't clear about and was told "it needs watching".    Pathology is as follows: 1. Breast, Mammoplasty, right tissue, 504g - FIBROCYSTIC CHANGES, INCLUDING MILD DUCTAL ECTASIA, MICROCYSTS AND STROMAL FIBROSIS. - GROSSLY UNREMARKABLE SKIN. - NO ATYPIA, IN SITU OR INVASIVE MALIGNANCY IDENTIFIED. 2. Breast, Mammoplasty, left tissue, 531g - FIBROCYSTIC CHANGES, INCLUDING APOCRINE CYSTS, MILD DUCTAL ECTASIA, SCLEROSING ADENOSIS AND STROMAL FIBROSIS. - GROSSLY UNREMARKABLE SKIN. - NO ATYPIA, IN SITU OR INVASIVE MALIGNANCY IDENTIFIED.  Pt reports her mother now has a new breast cancer.  First was lobular and this one is ductal--totally different cancer.  She also has hx in a maternal aunt and maternal cousin.  D/w pt proceeding with genetic testing.  Tyrer Crucik model done today with pt with >20% lifetime risk.  Feel should consider yearly MRI as well as MMG.  Will refer to genetics first and then proceed from there.  Pt comfortable with plan.  Feeling a lot of joint pain that she cannot decide is a normal "aging" thing or is sometime more.  Would like to do some blood work today.  No LMP recorded. Patient is not currently having periods (Reason: IUD).          Sexually active: Yes.    The current method of family planning is IUD. Mirena placed 02/11/16 Exercising: No.  The patient does not participate in regular exercise at present. Smoker:  no  Health Maintenance: Pap:  02/11/16 Neg. HR HPV:neg   12/02/13 Neg  History of abnormal Pap:  Yes, remote hx MMG:  05/11/16 BIRADS1:Neg. Has appt 05/12/17 Colonoscopy:  2006 Normal  BMD:   Never TDaP:  2009 Pneumonia vaccine(s):  2008  Zostavax:   No Hep C testing: N/A Screening Labs: Here   reports that she has  never smoked. She has never used smokeless tobacco. She reports that she does not drink alcohol or use drugs.  Past Medical History:  Diagnosis Date  . Allergic rhinitis    Dr. Doreene Nest, s/p allergy testing  . Anemia   . Asthma   . Basal cell carcinoma of anterior chest    removed  . Breast cyst 2011   bx benign  . Depression    after father died, on [place] in past  . GERD (gastroesophageal reflux disease)   . Gestational diabetes    with first child  . Headache(784.0)   . History of chicken pox   . Internal hemorrhoid 2006   dx during colon w/Dr Tiffany Kocher  . Migraines    associated with menses in past, tx with excedrin, may last up to 1 week  . Migraines   . Pericarditis 12/13  . Shingles 04/2011  . Sinusitis   . UTI (lower urinary tract infection)     Past Surgical History:  Procedure Laterality Date  . BASAL CELL CARCINOMA EXCISION  2011   [place]oved [place] [facility]  . BREAST CYST ASPIRATION Left 2011   neg  . CHOLECYSTECTOMY  2010   [facility]  Marland Kitchen COLONOSCOPY    . INTRAUTERINE DEVICE INSERTION  01/2011   Dr Sabra Heck  . KNEE ARTHROSCOPY W/ MENISCAL REPAIR  1989   Right, Dr. Mauri Pole  . NASAL SINUS SURGERY  1999, 2005  . TONSILLECTOMY  1988  .  VAGINAL DELIVERY     x 2    Current Outpatient Prescriptions  Medication Sig Dispense Refill  . albuterol (VENTOLIN HFA) 108 (90 Base) MCG/ACT inhaler Inhale 2 puffs into the lungs every 6 (six) hours as needed for wheezing or shortness of breath. For shortness of breath 1 Inhaler 0  . aspirin-acetaminophen-caffeine (EXCEDRIN MIGRAINE) 250-250-65 MG per tablet Take 1 tablet by mouth every 6 (six) hours as needed.    . cetirizine (ZYRTEC) 10 MG tablet Take 10 mg by mouth daily.    Marland Kitchen FLUoxetine (PROZAC) 20 MG capsule TAKE ONE CAPSULE BY MOUTH EVERY DAY 90 capsule 3  . fluticasone (FLONASE) 50 MCG/ACT nasal spray Place 2 sprays into the nose as needed.     Marland Kitchen levonorgestrel (MIRENA) 20 MCG/24HR IUD 1 Intra  Uterine Device (1 each total) by Intrauterine route once.    . metoprolol succinate (TOPROL-XL) 50 MG 24 hr tablet Take 1 tablet (50 mg total) by mouth daily. 90 tablet 3  . sodium chloride (OCEAN) 0.65 % SOLN nasal spray Place 2 sprays into both nostrils every 2 (two) hours while awake. 1 Bottle 0  . valACYclovir (VALTREX) 1000 MG tablet TAKE AS DIRECTED 30 tablet 1   No current facility-administered medications for this visit.     Family History  Problem Relation Age of Onset  . Hypertension Mother   . Cancer Mother 4       Breast Ca  . Arthritis Mother   . Breast cancer Mother 45  . Cancer Father 4       Pancreatic  . Diabetes Father   . Hypertension Father   . Hyperlipidemia Father   . Heart disease Father   . Cancer Sister 88       Thyroid  . Cancer Maternal Grandmother        Stomach - 25's  . Alcohol abuse Maternal Grandfather   . Alcohol abuse Paternal Grandfather   . Heart disease Paternal Grandfather   . Hyperlipidemia Paternal Grandfather   . Hypertension Paternal Grandfather   . Other Paternal Grandmother        brain tumor on pitutary gland  . Breast cancer Maternal Aunt   . Breast cancer Cousin        mat cousin    ROS:  Pertinent items are noted in HPI.  Otherwise, a comprehensive ROS was negative.  Exam:   BP 126/86 (BP Location: Right Arm, Patient Position: Sitting, Cuff Size: Normal)   Pulse 70   Resp 16   Ht 5\' 2"  (1.575 m)   Wt 174 lb (78.9 kg)   BMI 31.83 kg/m   Weight change: +10#  Height: 5\' 2"  (157.5 cm)  Ht Readings from Last 3 Encounters:  05/04/17 5\' 2"  (1.575 m)  02/16/17 5' 2.5" (1.588 m)  01/30/17 5\' 2"  (1.575 m)   General appearance: alert, cooperative and appears stated age Head: Normocephalic, without obvious abnormality, atraumatic Neck: no adenopathy, supple, symmetrical, trachea midline and thyroid normal to inspection and palpation Lungs: clear to auscultation bilaterally Breasts: s/p reduction with bilateral scarring  present.  Right breast has 2cm smooth but firm mass about 11 o'clock c/w cyst.  Scar from surgery coming down breast at 6 o'clock has a thickened area in it.  Left breast without findings.  No axillary LAD. Heart: regular rate and rhythm Abdomen: soft, non-tender; bowel sounds normal; no masses,  no organomegaly Extremities: extremities normal, atraumatic, no cyanosis or edema Skin: Skin color, texture, turgor normal. No rashes  or lesions Lymph nodes: Cervical, supraclavicular, and axillary nodes normal. No abnormal inguinal nodes palpated Neurologic: Grossly normal   Pelvic: External genitalia:  no lesions              Urethra:  normal appearing urethra with no masses, tenderness or lesions              Bartholins and Skenes: normal                 Vagina: normal appearing vagina with normal color and discharge, no lesions              Cervix: no lesions              Pap taken: No. Bimanual Exam:  Uterus:  normal size, contour, position, consistency, mobility, non-tender              Adnexa: normal adnexa and no mass, fullness, tenderness               Rectovaginal: Confirms               Anus:  normal sphincter tone, no lesions  Chaperone was present for exam.  A:  Well Woman with normal exam Amenorrhea with Mirena IUD placed 02/11/16 H/o depression/anxiety SUI H/O oral HSV Breast mass, possible cyst, s/p reduction with negative pathology Joint pain Family hx of breast cancer--mother now with second and different cancer  P:   Mammogram will be scheduled as diagnostic due to today's findings pap smear with HR HPV 3/17.  No pap smear today. Lab work will be obtained:  CBC, CMP, CRP, Lipids, EST, TSH, Vit D FSH obtained as well Pt will call when needs valtrex referral Prozac 20mg  daily.  #30/12RF Referral to genetics for breast cancer genetic testing. return annually or prn

## 2017-05-04 NOTE — Progress Notes (Signed)
Spoke with Hailey Wilkins. Patient scheduled while in office for Bilateral diagnostic MMG and right breast US at [facility] on 05/11/17 at 10:40 am. Patient verbalizes understanding and is agreeable.

## 2017-05-05 LAB — COMPREHENSIVE METABOLIC PANEL
ALT: 16 IU/L (ref 0–32)
AST: 22 IU/L (ref 0–40)
Albumin/Globulin Ratio: 1.7 (ref 1.2–2.2)
Albumin: 4.3 g/dL (ref 3.5–5.5)
Alkaline Phosphatase: 68 IU/L (ref 39–117)
BUN/Creatinine Ratio: 14 (ref 9–23)
BUN: 10 mg/dL (ref 6–24)
Bilirubin Total: 0.3 mg/dL (ref 0.0–1.2)
CO2: 23 mmol/L (ref 20–29)
Calcium: 8.9 mg/dL (ref 8.7–10.2)
Chloride: 103 mmol/L (ref 96–106)
Creatinine, Ser: 0.72 mg/dL (ref 0.57–1.00)
GFR calc Af Amer: 115 mL/min/{1.73_m2} (ref >59)
GFR calc non Af Amer: 100 mL/min/{1.73_m2} (ref >59)
Globulin, Total: 2.5 g/dL (ref 1.5–4.5)
Glucose: 81 mg/dL (ref 65–99)
Potassium: 4.6 mmol/L (ref 3.5–5.2)
Sodium: 138 mmol/L (ref 134–144)
Total Protein: 6.8 g/dL (ref 6.0–8.5)

## 2017-05-05 LAB — FOLLICLE STIMULATING HORMONE: FSH: 8.8 m[IU]/mL

## 2017-05-05 LAB — CBC
Hematocrit: 40.2 % (ref 34.0–46.6)
Hemoglobin: 13.3 g/dL (ref 11.1–15.9)
MCH: 29.3 pg (ref 26.6–33.0)
MCHC: 33.1 g/dL (ref 31.5–35.7)
MCV: 89 fL (ref 79–97)
Platelets: 287 10*3/uL (ref 150–379)
RBC: 4.54 x10E6/uL (ref 3.77–5.28)
RDW: 13.5 % (ref 12.3–15.4)
WBC: 6 10*3/uL (ref 3.4–10.8)

## 2017-05-05 LAB — LIPID PANEL
Chol/HDL Ratio: 3 ratio (ref 0.0–4.4)
Cholesterol, Total: 145 mg/dL (ref 100–199)
HDL: 48 mg/dL (ref >39)
LDL Calculated: 84 mg/dL (ref 0–99)
Triglycerides: 64 mg/dL (ref 0–149)
VLDL Cholesterol Cal: 13 mg/dL (ref 5–40)

## 2017-05-05 LAB — VITAMIN D 25 HYDROXY (VIT D DEFICIENCY, FRACTURES): Vit D, 25-Hydroxy: 27.8 ng/mL — ABNORMAL LOW (ref 30.0–100.0)

## 2017-05-05 LAB — C-REACTIVE PROTEIN: CRP: 2.4 mg/L (ref 0.0–4.9)

## 2017-05-05 LAB — SEDIMENTATION RATE: Sed Rate: 3 mm/hr (ref 0–32)

## 2017-05-05 LAB — TSH: TSH: 2.11 u[IU]/mL (ref 0.450–4.500)

## 2017-05-07 ENCOUNTER — Encounter: Payer: Self-pay | Admitting: Obstetrics & Gynecology

## 2017-05-10 ENCOUNTER — Encounter: Payer: Self-pay | Admitting: Obstetrics & Gynecology

## 2017-05-11 ENCOUNTER — Ambulatory Visit
Admission: RE | Admit: 2017-05-11 | Discharge: 2017-05-11 | Disposition: A | Discharge status: Home / Self Care | Payer: BLUE CROSS/BLUE SHIELD | Source: Ambulatory Visit | Attending: Obstetrics & Gynecology | Admitting: Obstetrics & Gynecology

## 2017-05-11 ENCOUNTER — Other Ambulatory Visit: Payer: Self-pay | Admitting: Obstetrics & Gynecology

## 2017-05-11 DIAGNOSIS — N6310 Unspecified lump in the right breast, unspecified quadrant: Secondary | ICD-10-CM

## 2017-05-11 DIAGNOSIS — N6320 Unspecified lump in the left breast, unspecified quadrant: Secondary | ICD-10-CM | POA: Diagnosis not present

## 2017-05-11 DIAGNOSIS — R928 Other abnormal and inconclusive findings on diagnostic imaging of breast: Secondary | ICD-10-CM | POA: Insufficient documentation

## 2017-05-23 ENCOUNTER — Ambulatory Visit: Payer: BLUE CROSS/BLUE SHIELD | Admitting: Obstetrics & Gynecology

## 2017-05-25 ENCOUNTER — Telehealth: Payer: Self-pay | Admitting: Genetics

## 2017-05-25 ENCOUNTER — Encounter: Payer: Self-pay | Admitting: Genetics

## 2017-05-25 NOTE — Telephone Encounter (Signed)
Pt has been scheduled to see Ria Comment on 7/16 at 1pm for genetic counseling. Aware to arrive 30 minutes early. Demographics verified. Letter mailed.

## 2017-06-05 ENCOUNTER — Encounter: Payer: Self-pay | Admitting: Genetics

## 2017-06-05 ENCOUNTER — Ambulatory Visit (HOSPITAL_BASED_OUTPATIENT_CLINIC_OR_DEPARTMENT_OTHER): Payer: BLUE CROSS/BLUE SHIELD | Admitting: Genetics

## 2017-06-05 ENCOUNTER — Other Ambulatory Visit: Payer: BLUE CROSS/BLUE SHIELD

## 2017-06-05 DIAGNOSIS — Z803 Family history of malignant neoplasm of breast: Secondary | ICD-10-CM | POA: Diagnosis not present

## 2017-06-05 DIAGNOSIS — Z800 Family history of malignant neoplasm of digestive organs: Secondary | ICD-10-CM

## 2017-06-05 NOTE — Progress Notes (Signed)
REFERRING PROVIDER: Megan Salon, MD [address] [place], [place] [address]  PRIMARY PROVIDER:  Talmage Nap, PA-C  PRIMARY REASON FOR VISIT:  1. Family history of breast cancer   2. Family history of stomach cancer   3. Family history of pancreatic cancer      HISTORY OF PRESENT ILLNESS:   Hailey Wilkins, a 46 y.o. female, was seen for a [place] cancer genetics consultation at the request of Dr. Sabra Heck due to a family history of cancer.  Hailey Wilkins presents to clinic today to discuss the possibility of a hereditary predisposition to cancer, genetic testing, and to further clarify her future cancer risks, as well as potential cancer risks for family members.    Hailey Wilkins is a 46 y.o. female with no personal history of cancer.    HORMONAL RISK FACTORS:  Menarche was at age 32.  First live birth at age 67.  OCP use for approximately 12 years.  Ovaries intact: yes.  Hysterectomy: no.  Menopausal status: premenopausal.  HRT use: 0 years. Colonoscopy: yes; normal. Mammogram within the last year: yes. Number of breast biopsies: 1. Up to date with pelvic exams:  yes.  Past Medical History:  Diagnosis Date  . Allergic rhinitis    Dr. Doreene Nest, s/p allergy testing  . Anemia   . Asthma   . Basal cell carcinoma of anterior chest    removed  . Breast cyst 2011   bx benign  . Depression    after father died, on [place] in past  . Family history of breast cancer   . Family history of pancreatic cancer   . Family history of stomach cancer   . GERD (gastroesophageal reflux disease)   . Gestational diabetes    with first child  . Headache(784.0)   . History of chicken pox   . Internal hemorrhoid 2006   dx during colon w/Dr Tiffany Kocher  . Migraines    associated with menses in past, tx with excedrin, may last up to 1 week  . Migraines   . Pericarditis 12/13  . Shingles 04/2011  . Sinusitis   . UTI (lower urinary tract infection)     Past Surgical History:    Procedure Laterality Date  . BASAL CELL CARCINOMA EXCISION  2011   removed from chest/[place] [facility]  . BREAST CYST ASPIRATION Left 2011   neg  . CHOLECYSTECTOMY  2010   [facility]  Marland Kitchen COLONOSCOPY    . INTRAUTERINE DEVICE INSERTION  01/2011   Dr Sabra Heck  . KNEE ARTHROSCOPY W/ MENISCAL REPAIR  1989   Right, Dr. Mauri Pole  . NASAL SINUS SURGERY  1999, 2005  . REDUCTION MAMMAPLASTY  2017  . TONSILLECTOMY  1988  . VAGINAL DELIVERY     x 2    Social History   Social History  . Marital status: Married    Spouse name: N/A  . Number of children: 2  . Years of education: N/A   Occupational History  . Payroll & Benefits admin - [employer]   Social History Main Topics  . Smoking status: Never Smoker  . Smokeless tobacco: Never Used  . Alcohol use No  . Drug use: No  . Sexual activity: Yes    Partners: Male    Birth control/ protection: IUD     Comment: Mirena inserted 02/11/16   Other Topics Concern  . None   Social History Narrative   Lives in [place]  with husband and 19YO and 11YO. Works at [employer]. Dog and Cat.      Regular Exercise -  GYM, wts, walking, elliptical - 3 times a week x 1 hour   Daily Caffeine Use:  3 cups coffee, 1 diet soda              FAMILY HISTORY:  We obtained a detailed, 4-generation family history.  Significant diagnoses are listed below: Family History  Problem Relation Age of Onset  . Hypertension Mother   . Cancer Mother 75       Breast Ca  . Arthritis Mother   . Breast cancer Mother 27       2nd primary  . Cancer Father 40       Pancreatic  . Diabetes Father   . Hypertension Father   . Hyperlipidemia Father   . Heart disease Father   . Cancer Sister 3       Thyroid  . Cancer Maternal Grandmother        might have had cancer, died at 95  . Alcohol abuse Maternal Grandfather   . Pancreatic cancer Maternal Grandfather 64       died in his 19's  . Alcohol abuse Paternal  Grandfather   . Heart disease Paternal Grandfather   . Hyperlipidemia Paternal Grandfather   . Hypertension Paternal Grandfather   . Other Paternal Grandmother        brain tumor on pitutary gland  . Brain cancer Paternal Grandmother 20       pituitary tumor, died in her 17's  . Breast cancer Cousin 28       maternal couisn, is now in her 73's  . Stomach cancer Maternal Aunt 75       is now 65  . Stomach cancer Maternal Aunt 75       died in 93's  . Breast cancer Cousin 14       maternal cousin, is now 65   Hailey Wilkins has 2 sons ages 25 and 77 who have no history of cancer. Hailey Wilkins has a sister who is 41 and had thyroid cancer at 50.  This sister has a daughter (44) and a son (24) who have no history of cancer.  Hailey Wilkins has a 41 year-old brother who has a son (50) and a daughter (15) who all have no history of cancer.  Hailey Wilkins father was diagnosed with pancreatic cancer at 23 and died 2 months later.  Hailey Wilkins has 1 paternal half-uncle who is 79 and has no history of cancer.  This uncle has 2 sons (ages 66 and 42) with no history of cancer. Hailey Wilkins paternal grandmother had a cancerous pituitary tumor and died in her 68's.  No information is known about her side of the family.  Hailey Wilkins paternal grandfather died in his 47's due to heart failure.  No information is known about his side of the family.    Hailey Wilkins mother was diagnosed with breast cancer at the age of 15.  Recently, at the age of 39, a second primary breast cancer was diagnosed at the age of 76.  Hailey Wilkins has 6 maternal aunts and 2 maternal uncles listed below: -1 maternal aunt is a fraternal twin of Hailey Wilkins's mother. She is 29 and has no history of cancer.  This aunt has a son and a daughter in their 13's with no history of cancer.  -1 maternal aunt is 64 and had stomach  cancer in her 72's/70's.  This aunt has 3 daughters and 2 sons. One of the daughters (patient's cousin) had breast cancer in her  80's and is now 74.  One daughter is in her 6's and had breast cancer in her 55's. The other 3 cousins are in their 25's and 39's with no history of cancer.  -1 maternal aunt is 24 with a daughter in her 42's.  No history of cancer for these relatives.   -1 maternal aunt died in her 98's, and had 2 daughters and a son who are in their 70's/60's.  No history of cancer for these relatives.  -1 maternal aunt died in her 82's and no history of cancer.  She had 3 daughters and a son, but no information is known about their health.  -1 maternal aunt was diagnosed and died from stomach cancer in her 73's.  She had a daughter and 2 sons in their 65's/60's with no known history of cancer.  -1 maternal uncle died in hist 3's and had 5 children.  No history of cancer for these relatives.   -1 maternal uncle died in his 67's and had 4 children.  No history of cancer for these relatives.    Hailey Wilkins is unaware of previous family history of genetic testing for hereditary cancer risks. Patient's maternal ancestors are of Northern European descent, and paternal ancestors are of Northern European descent. There is no reported Ashkenazi Jewish ancestry. There is No known consanguinity.  GENETIC COUNSELING ASSESSMENT: Reggie Welge is a 46 y.o. female with a family history which is somewhat suggestive of a Hereditary Cancer Predisposition syndrome and predisposition to cancer. We, therefore, discussed and recommended the following at today's visit.   DISCUSSION: We reviewed the characteristics, features and inheritance patterns of hereditary cancer syndromes. We also discussed genetic testing, including the appropriate family members to test, the process of testing, insurance coverage and turn-around-time for results. We discussed the implications of a negative, positive and/or variant of uncertain significant result. We recommended Hailey Wilkins pursue genetic testing for the [facility] Common Hereditary Cancer gene  panel. The Hereditary Gene Panel offered by [facility] includes sequencing and/or deletion duplication testing of the following 46 genes: APC, ATM, AXIN2, BARD1, BMPR1A, BRCA1, BRCA2, BRIP1, CDH1, CDKN2A (p14ARF), CDKN2A (p16INK4a), CHEK2, CTNNA1, DICER1, EPCAM (Deletion/duplication testing only), GREM1 (promoter region deletion/duplication testing only), KIT, MEN1, MLH1, MSH2, MSH3, MSH6, MUTYH, NBN, NF1, NHTL1, PALB2, PDGFRA, PMS2, POLD1, POLE, PTEN, RAD50, RAD51C, RAD51D, SDHB, SDHC, SDHD, SMAD4, SMARCA4. STK11, TP53, TSC1, TSC2, and VHL.  The following genes were evaluated for sequence changes only: SDHA and HOXB13 c.251G>A variant only. We discussed the option to add on additional pancreatic cancer risk genes, and the patient decided that she did not want to add these genes to the panel.    We discussed that only 5-10% of cancers are associated with a Hereditary cancer predisposition syndrome.  One of the most common hereditary cancer syndromes that increases breast cancer risk is called Hereditary Breast and Ovarian Cancer (HBOC) syndrome.  This syndrome is caused by mutations in the BRCA1 and BRCA2 genes.  This syndrome increases an individual's lifetime risk to develop breast, ovarian, pancreatic, and other types of cancer.  There are also many other cancer predisposition syndromes caused by mutations in several other genes.  We discussed that if she is found to have a mutation in one of these genes, it may impact  future medical management recommendations such as increased cancer screenings and consideration  of risk reducing surgeries.  A positive result could also have implications for the patient's family members.  A Negative result would mean we were unable to identify a hereditary component to her cancer, but does not rule out the possibility of a hereditary basis for her cancer.  There could be mutations that are undetectable by current technology, or in genes not yet tested or identified to  increase cancer risk.    We discussed the potential to find a Variant of Uncertain Significance or VUS.  These are variants that have not yet been identified as pathogenic or benign, and it is unknown if this variant is associated with increased cancer risk or if this is a normal finding.  Most VUS's are reclassified to benign or likely benign.   It should not be used to make medical management decisions. With time, we suspect the lab will determine the significance of any VUS's identified if any.   Based on Hailey Wilkins's family history of cancer, she meets medical criteria for genetic testing. Despite that she meets criteria, she may still have an out of pocket cost. We discussed that if her out of pocket cost for testing is over $100, the laboratory will call and confirm whether she wants to proceed with testing.  If the out of pocket cost of testing is less than $100 she will be billed by the genetic testing laboratory.   Based on the patient's personal and family history, statistical models ([place] and [place])  and literature data were used to estimate her risk of developing breast cancer. These estimate her lifetime risk of developing breast cacner to be approximately 16.5% to 19.7%. This estimation does not take into account any genetic testing results.  The patient's lifetime breast cancer risk is a preliminary estimate based on available information using one of several models endorsed by the [place] (ACS). The ACS recommends consideration of breast MRI screening as an adjunct to mammography for patients at high risk (defined as 20% or greater lifetime risk). A more detailed breast cancer risk assessment can be considered, if clinically indicated.       PLAN: After considering the risks, benefits, and limitations, Hailey Wilkins  provided informed consent to pursue genetic testing and the blood sample was sent to [facility] for analysis of the Common Hereditary Cancer  Panel. Results should be available within approximately 2-3 weeks' time, at which point they will be disclosed by telephone to Hailey Wilkins, as will any additional recommendations warranted by these results. Ms. Bagby will receive a summary of her genetic counseling visit and a copy of her results once available. This information will also be available in Epic. We encouraged Ms. Belardo to remain in contact with cancer genetics annually so that we can continuously update the family history and inform her of any changes in cancer genetics and testing that may be of benefit for her family. Ms. Aust questions were answered to her satisfaction today. Our contact information was provided should additional questions or concerns arise.  Based on Ms. Ricco's family history, we recommended her mother, who was diagnosed with her first breast cancer at age 12 and second primary breast cancer at 49, have genetic counseling and testing.  Additionally her maternal cousins who were both diagnosed with breast cancer could pursue genetic testing. Ms. Wardle will let us know if we can be of any assistance in coordinating genetic counseling and/or testing for this family member.   Lastly, we encouraged Ms. Kreft to  remain in contact with cancer genetics annually so that we can continuously update the family history and inform her of any changes in cancer genetics and testing that may be of benefit for this family.   Ms.  Mayor questions were answered to her satisfaction today. Our contact information was provided should additional questions or concerns arise. Thank you for the referral and allowing [place] to share in the care of your patient.   Tana Felts, MS Genetic Counselor [email or web address] phone: [phone]  The patient was seen for a total of 45 minutes in face-to-face genetic counseling.

## 2017-06-14 ENCOUNTER — Telehealth: Payer: Self-pay | Admitting: Genetics

## 2017-06-14 ENCOUNTER — Ambulatory Visit: Payer: Self-pay | Admitting: Genetics

## 2017-06-14 ENCOUNTER — Encounter: Payer: Self-pay | Admitting: Genetics

## 2017-06-14 DIAGNOSIS — Z803 Family history of malignant neoplasm of breast: Secondary | ICD-10-CM

## 2017-06-14 DIAGNOSIS — Z800 Family history of malignant neoplasm of digestive organs: Secondary | ICD-10-CM

## 2017-06-14 DIAGNOSIS — Z1379 Encounter for other screening for genetic and chromosomal anomalies: Secondary | ICD-10-CM | POA: Insufficient documentation

## 2017-06-14 NOTE — Progress Notes (Signed)
HPI: Hailey Wilkins was previously seen in the [facility] on 06/05/2017 due to a family history of breast, pancreatic, and stomach  cancer and concerns regarding a hereditary predisposition to cancer. Please refer to our prior cancer genetics clinic note for more information regarding Hailey Wilkins's medical, social and family histories, and our assessment and recommendations, at the time. Hailey Wilkins recent genetic test results were disclosed to her, as well as recommendations warranted by these results. These results and recommendations are discussed in more detail below.  FAMILY HISTORY:  We obtained a detailed, 4-generation family history.  Significant diagnoses are listed below: Family History  Problem Relation Age of Onset  . Hypertension Mother   . Cancer Mother 66       Breast Ca  . Arthritis Mother   . Breast cancer Mother 63       2nd primary  . Cancer Father 47       Pancreatic  . Diabetes Father   . Hypertension Father   . Hyperlipidemia Father   . Heart disease Father   . Cancer Sister 53       Thyroid  . Cancer Maternal Grandmother        might have had cancer, died at 30  . Alcohol abuse Maternal Grandfather   . Pancreatic cancer Maternal Grandfather 16       died in his 28's  . Alcohol abuse Paternal Grandfather   . Heart disease Paternal Grandfather   . Hyperlipidemia Paternal Grandfather   . Hypertension Paternal Grandfather   . Other Paternal Grandmother        brain tumor on pitutary gland  . Brain cancer Paternal Grandmother 12       pituitary tumor, died in her 71's  . Breast cancer Cousin 11       maternal couisn, is now in her 26's  . Stomach cancer Maternal Aunt 75       is now 61  . Stomach cancer Maternal Aunt 75       died in 64's  . Breast cancer Cousin 70       maternal cousin, is now 84   Hailey Wilkins has 2 sons ages 84 and 29 who have no history of cancer. Hailey Wilkins has a sister who is 9 and had thyroid cancer at 25.  This  sister has a daughter (74) and a son (57) who have no history of cancer.  Hailey Wilkins has a 35 year-old brother who has a son (6) and a daughter (55) who all have no history of cancer.  Hailey Wilkins father was diagnosed with pancreatic cancer at 65 and died 2 months later.  Hailey Wilkins has 1 paternal half-uncle who is 80 and has no history of cancer.  This uncle has 2 sons (ages 48 and 18) with no history of cancer. Hailey Wilkins paternal grandmother had a cancerous pituitary tumor and died in her 54's.  No information is known about her side of the family.  Hailey Wilkins paternal grandfather died in his 28's due to heart failure.  No information is known about his side of the family.    Hailey Wilkins mother was diagnosed with breast cancer at the age of 24.  Recently, at the age of 77, a second primary breast cancer was diagnosed at the age of 40.  Hailey Wilkins has 6 maternal aunts and 2 maternal uncles listed below: -1 maternal aunt is a fraternal twin of Hailey Wilkins mother. She  is 66 and has no history of cancer.  This aunt has a son and a daughter in their 41's with no history of cancer.  -1 maternal aunt is 2 and had stomach cancer in her 52's/70's.  This aunt has 3 daughters and 2 sons. One of the daughters (patient's cousin) had breast cancer in her 38's and is now 36.  One daughter is in her 25's and had breast cancer in her 64's. The other 3 cousins are in their 2's and 59's with no history of cancer.  -1 maternal aunt is 71 with a daughter in her 12's.  No history of cancer for these relatives.   -1 maternal aunt died in her 45's, and had 2 daughters and a son who are in their 67's/60's.  No history of cancer for these relatives.  -1 maternal aunt died in her 32's and no history of cancer.  She had 3 daughters and a son, but no information is known about their health.  -1 maternal aunt was diagnosed and died from stomach cancer in her 78's.  She had a daughter and 2 sons in their 39's/60's with no  known history of cancer.  -1 maternal uncle died in hist 71's and had 5 children.  No history of cancer for these relatives.   -1 maternal uncle died in his 47's and had 4 children.  No history of cancer for these relatives.    Hailey Wilkins is unaware of previous family history of genetic testing for hereditary cancer risks. Patient's maternal ancestors are of Northern European descent, and paternal ancestors are of Northern European descent. There is no reported Ashkenazi Jewish ancestry. There is No known consanguinity.  GENETIC TEST RESULTS: Genetic testing performed through [facility]'s Common Hereditary Cancer Panel reported out on 06/13/2017 showed no pathogenic mutations. The Hereditary Gene Panel offered by [facility] includes sequencing and/or deletion duplication testing of the following 46 genes: APC, ATM, AXIN2, BARD1, BMPR1A, BRCA1, BRCA2, BRIP1, CDH1, CDKN2A (p14ARF), CDKN2A (p16INK4a), CHEK2, CTNNA1, DICER1, EPCAM (Deletion/duplication testing only), GREM1 (promoter region deletion/duplication testing only), KIT, MEN1, MLH1, MSH2, MSH3, MSH6, MUTYH, NBN, NF1, NHTL1, PALB2, PDGFRA, PMS2, POLD1, POLE, PTEN, RAD50, RAD51C, RAD51D, SDHB, SDHC, SDHD, SMAD4, SMARCA4. STK11, TP53, TSC1, TSC2, and VHL.  The following genes were evaluated for sequence changes only: SDHA and HOXB13 c.251G>A variant only..  The test report will be scanned into EPIC and will be located under the Molecular Pathology section of the Results Review tab.A portion of the result report is included below for reference.     We discussed with Hailey Wilkins that because current genetic testing is not perfect, it is possible there may be a gene mutation in one of these genes that current testing cannot detect, but that chance is small. We also discussed, that there could be another gene that has not yet been discovered, or that we have not yet tested, that is responsible for the cancer diagnoses in the family. Additionally, there could be  a hereditary cause for the caner in her family that she did not inherit and therefore was not detected in her.  It is important to remain in touch with cancer genetics in the future so that we can continue to offer Hailey Wilkins the most up to date genetic testing.   ADDITIONAL GENETIC TESTING: We discussed with Hailey Wilkins that there are other genes that are associated with increased cancer risk that can be analyzed. The laboratories that offer this testing look at these additional genes via  a hereditary cancer gene panel. Should Hailey Wilkins wish to pursue additional genetic testing, we are happy to discuss and coordinate this testing, at any time.    CANCER SCREENING RECOMMENDATIONS: Based on these negative genetic test results, there areno additional cancer risks we are aware of for Hailey Wilkins, and no additional screening or medical management that we would recommend for her at this time based on genetic testing.    This normal result is somewhat reassuring and indicates that it is unlikely Ms. Worrel has an increased risk of cancer due to a mutation in one of these genes.  However, our ability to inerpret this result is limited without genetic testing from other relatives.  Therefore, Ms. Mccullum was advised to continue following the cancer screening guidelines provided by her primary healthcare providers. Other factors such as her personal and family history may still affect her cancer risk.    Based on the Ms. Ridgley's personal and family history of cancer, as well as her genetic test results, the statistical model Tobey Bride Cusik was used to estimate her risk of developing breast cancer. These estimate her lifetime risk of developing breast cancer to be approximately 29.5%.  The patient's lifetime breast cancer risk is a preliminary estimate based on available information using one of several models endorsed by the [place] (ACS). The ACS recommends consideration of breast MRI screening as an  adjunct to mammography for patients at high risk (defined as 20% or greater lifetime risk). A more detailed breast cancer risk assessment can be considered, if clinically indicated. This calculation took into account hormonal risk factors, family history, and other personal history (breast density, biopsy findings, etc).  If other relatives in the family have genetic testing, their results could change this risk assessment.   ]Ms. Seymour has been determined to be at high risk for breast cancer.  Therefore, we recommend that annual screening with mammography and breast MRI begin at age 3, or 10 years prior to the age of breast cancer diagnosis in a relative (whichever is earlier).  We discussed that Ms. Borromeo should discuss her individual situation with her referring physician to determine a breast cancer screening plan with which they are both comfortable.  Ms. Beckers requested that this information be sent to her referring provider Dr. Hale Bogus to follow up on this plan.     RECOMMENDATIONS FOR FAMILY MEMBERS: Women in this family might be at some increased risk of developing cancer, over the general population risk, simply due to the family history of cancer. We recommended women in this family have a yearly mammogram beginning at age 32, or 28 years younger than the earliest onset of cancer, an annual clinical breast exam, and perform monthly breast self-exams. Women in this family should also have a gynecological exam as recommended by their primary provider. All family members should have a colonoscopy by age 68.  Based on Ms. Bisig's family history, we recommended her mother, who was diagnosed with 2 primary breast cancers at ages 27 and 46 have genetic counseling and testing. Her maternal cousins who were affected with breast cancer could also be informative relatives to test.  Should these maternal relatives decline genetic testing, other maternal relatives would be recommended to pursue  genetic counseling and genetic testing. Additionally, due to her paternal family history of pancreatic cancer her siblings and other paternal relatives appear to meet NCCN medical criteria for genetic counseling and genetic testing.  Ms. Gienger will let us know if we  can be of any assistance in coordinating genetic counseling and/or testing for any family members.   FOLLOW-UP: Lastly, we discussed with Ms. Fitterer that cancer genetics is a rapidly advancing field and it is possible that new genetic tests will be appropriate for her and/or her family members in the future. We encouraged her to remain in contact with cancer genetics on an annual basis so we can update her personal and family histories and let her know of advances in cancer genetics that may benefit this family. If any other relatives have genetic testing, their results may alter our risk assessment for her.    Our contact number was provided. Ms. Brosh questions were answered to her satisfaction, and she knows she is welcome to call us at anytime with additional questions or concerns.   Ferol Luz, MS Genetic Counselor [email or web address]

## 2017-06-14 NOTE — Telephone Encounter (Signed)
Revealed negative genetic testing.  Discussed that we do not know why she has a family history breast cancer or why there is cancer in the family. It could be due to a different gene that we are not testing, or maybe our current technology may not be able to pick something up.  There could also be a hereditary cause to her mother's breast cancer that Hailey Wilkins did not inherit.  Based on her family and personal history she is considered high risk for breast cancer (over 20%) and therefore we recommended additional breast screening (addition of annual MRI) It will be important for her to keep in contact with genetics to keep up with whether additional testing may be needed or if her risk changes based on other relatives have genetic testing.

## 2017-06-16 ENCOUNTER — Encounter: Payer: Self-pay | Admitting: Obstetrics & Gynecology

## 2017-06-16 DIAGNOSIS — Z9189 Other specified personal risk factors, not elsewhere classified: Secondary | ICD-10-CM

## 2017-06-16 DIAGNOSIS — Z803 Family history of malignant neoplasm of breast: Secondary | ICD-10-CM

## 2017-08-26 ENCOUNTER — Ambulatory Visit
Admission: RE | Admit: 2017-08-26 | Discharge: 2017-08-26 | Disposition: A | Discharge status: Home / Self Care | Payer: BLUE CROSS/BLUE SHIELD | Source: Ambulatory Visit | Attending: Obstetrics & Gynecology | Admitting: Obstetrics & Gynecology

## 2017-08-26 DIAGNOSIS — Z9189 Other specified personal risk factors, not elsewhere classified: Secondary | ICD-10-CM

## 2017-08-26 DIAGNOSIS — N6489 Other specified disorders of breast: Secondary | ICD-10-CM | POA: Diagnosis not present

## 2017-08-26 DIAGNOSIS — Z803 Family history of malignant neoplasm of breast: Secondary | ICD-10-CM

## 2017-08-26 MED ORDER — GADOBENATE DIMEGLUMINE 529 MG/ML IV SOLN
16.0000 mL | Freq: Once | INTRAVENOUS | Status: AC | PRN
  Administered 2017-08-26: 16 mL via INTRAVENOUS

## 2017-08-30 DIAGNOSIS — N62 Hypertrophy of breast: Secondary | ICD-10-CM | POA: Diagnosis not present

## 2017-10-02 ENCOUNTER — Telehealth: Payer: Self-pay | Admitting: Obstetrics & Gynecology

## 2017-10-02 ENCOUNTER — Encounter: Payer: Self-pay | Admitting: Obstetrics & Gynecology

## 2017-10-02 NOTE — Telephone Encounter (Signed)
Dr. Sabra Heck - see MyChart message below.    From Rushie Goltz To Megan Salon, MD Sent 10/02/2017 1:50 PM  Hi Dr. Sabra Heck,  I have an appointment to come in on Thursday for some pain that I have been having. I talked to the triage nurse this morning and she is will fill you in when you get back to the office. If it happens to be caused by my IUD would we be able to take it out on Thursday?  Thanks!  Hailey Wilkins

## 2017-10-02 NOTE — Telephone Encounter (Signed)
Patient is experiencing a lot of pressure and wondering if it could be coming from the iud.

## 2017-10-02 NOTE — Telephone Encounter (Signed)
See telephone encounter dated 10/02/17.

## 2017-10-02 NOTE — Telephone Encounter (Signed)
Spoke with patient. Reports pelvic pressure for the past month that is getting worse. Describes as sore and "swollen". Describes swelling as external at pelvic hair line and no visible swelling.   Denies urinary complaints, N/V, fever/chills, lower back pain or vaginal d/c or odor.   Mirena IUD in place. Placed 02/11/16. Patient states she has a history of "imbedded IUD", is concerned.   Last PUS 04/28/16  Recommended OV for further evaluation. Patient scheduled for OV on 11/15 at 2pm with Dr. Sabra Heck. Advised patient should pain become worse or new symptoms develop, return call to office for earlier appt. Advised patient Dr. Sabra Heck will review, I will return call with any additional recommendations. Patient verbalizes understanding and is agreeable.   Dr. Sabra Heck -any additional recommendations?

## 2017-10-02 NOTE — Telephone Encounter (Signed)
I think she needs a PUS with her OV.  Is that possible?  Please adjust schedule as necessary.

## 2017-10-03 NOTE — Telephone Encounter (Signed)
Call to patient. Advised of recommendations from Dr Sabra Heck. Appointment for pelvic ultrasound scheduled for 10-05-17 at 1pm here in office.  Patient advised to take Motrin 800 mg one hour prior with food in event of decision to remove IUD.  Patient reports previous IUD removal was very painful and she may take valium. Advised to come early and sign consent and have driver.  Patient states she has old RX from surgery last October. Advised will review with Dr Sabra Heck.  Patient states she only needs one tablet.

## 2017-10-04 ENCOUNTER — Encounter: Payer: Self-pay | Admitting: Obstetrics & Gynecology

## 2017-10-04 ENCOUNTER — Other Ambulatory Visit: Payer: Self-pay | Admitting: *Deleted

## 2017-10-04 DIAGNOSIS — R102 Pelvic and perineal pain: Secondary | ICD-10-CM

## 2017-10-04 NOTE — Telephone Encounter (Signed)
Pt sent mychart message today that she is coming along to the visit and does not need the valium.  Ok to close encounter.

## 2017-10-05 ENCOUNTER — Ambulatory Visit (INDEPENDENT_AMBULATORY_CARE_PROVIDER_SITE_OTHER): Payer: BLUE CROSS/BLUE SHIELD

## 2017-10-05 ENCOUNTER — Other Ambulatory Visit: Payer: Self-pay

## 2017-10-05 ENCOUNTER — Encounter: Payer: Self-pay | Admitting: Obstetrics & Gynecology

## 2017-10-05 ENCOUNTER — Ambulatory Visit: Payer: BLUE CROSS/BLUE SHIELD | Admitting: Obstetrics & Gynecology

## 2017-10-05 VITALS — BP 98/70 | HR 88 | Resp 16 | Ht 62.0 in | Wt 180.0 lb

## 2017-10-05 DIAGNOSIS — R102 Pelvic and perineal pain: Secondary | ICD-10-CM

## 2017-10-05 DIAGNOSIS — Z30432 Encounter for removal of intrauterine contraceptive device: Secondary | ICD-10-CM | POA: Diagnosis not present

## 2017-10-05 LAB — POCT URINALYSIS DIPSTICK
Bilirubin, UA: NEGATIVE
Glucose, UA: NEGATIVE
Ketones, UA: NEGATIVE
Nitrite, UA: NEGATIVE
Protein, UA: NEGATIVE
Urobilinogen, UA: 0.2 E.U./dL
pH, UA: 5 (ref 5.0–8.0)

## 2017-10-05 NOTE — Progress Notes (Signed)
GYNECOLOGY  VISIT  CC:   Pelvic pressure  HPI: 46 y.o. G2P2 Married Caucasian female here for pelvic pressure that she reports she has been experiencing for several months.  She felt this before with her last IUD and has decided that she wants her IUD removed.  Advised doing PUS as well to make sure there is no abnormality with her ovary before IUD removal.  Pt is here for this today as well.  Denies vaginal bleeding.  Will not have contraception after IUD removal today.  Pt states husband is going to have a vasectomy and they will use condoms until then.  Ultrasound: Uterus:  8.1 x 4.2 x 3.5cm with IUD noted in correct location Endometrium:  30mm Left ovary:  3.3 x 1.9 x 1.9cm with 2.0 x 3.7CH follicle Right ovary:  2.7 x 1.0 x 1.4cm Cul de sac:  No free fluid  Findings reviewed with pt.  She does desire IUD removal today.  GYNECOLOGIC HISTORY: No LMP recorded. Patient is not currently having periods (Reason: IUD). Contraception: IUD Menopausal hormone therapy: none  Patient Active Problem List   Diagnosis Date Noted  . Genetic testing 06/14/2017  . Family history of breast cancer   . Family history of stomach cancer   . Family history of pancreatic cancer   . Emotional lability 01/23/2015  . Pain of left calf 09/24/2013  . Asthma, mild intermitent 12/18/2012  . Allergic rhinitis 12/18/2012  . Sinus tachycardia 12/15/2012  . Chest pain on breathing 11/09/2012  . Migraine 05/31/2012  . Hot flashes 04/23/2012  . Atopic dermatitis 04/05/2012  . Shellfish allergy 04/05/2012  . Sinusitis, chronic 02/13/2012  . Allergy, drug 02/13/2012    Past Medical History:  Diagnosis Date  . Allergic rhinitis    Dr. Doreene Nest, s/p allergy testing  . Anemia   . Asthma   . Basal cell carcinoma of anterior chest    removed  . Breast cyst 2011   bx benign  . Depression    after father died, on [place] in past  . Family history of breast cancer   . Family history of pancreatic cancer     . Family history of stomach cancer   . GERD (gastroesophageal reflux disease)   . Gestational diabetes    with first child  . Headache(784.0)   . History of chicken pox   . Internal hemorrhoid 2006   dx during colon w/Dr Tiffany Kocher  . Migraines    associated with menses in past, tx with excedrin, may last up to 1 week  . Migraines   . Pericarditis 12/13  . Shingles 04/2011  . Sinusitis   . UTI (lower urinary tract infection)     Past Surgical History:  Procedure Laterality Date  . BASAL CELL CARCINOMA EXCISION  2011   removed from chest/[facility]  . BREAST CYST ASPIRATION Left 2011   neg  . CHOLECYSTECTOMY  2010   [facility]  Marland Kitchen COLONOSCOPY    . INTRAUTERINE DEVICE INSERTION  01/2011   Dr Sabra Heck  . KNEE ARTHROSCOPY W/ MENISCAL REPAIR  1989   Right, Dr. Mauri Pole  . NASAL SINUS SURGERY  1999, 2005  . REDUCTION MAMMAPLASTY  2017  . TONSILLECTOMY  1988  . VAGINAL DELIVERY     x 2    MEDS:   Current Outpatient Medications on File Prior to Visit  Medication Sig Dispense Refill  . albuterol (VENTOLIN HFA) 108 (90 Base) MCG/ACT inhaler Inhale 2 puffs into the lungs  every 6 (six) hours as needed for wheezing or shortness of breath. For shortness of breath 1 Inhaler 0  . aspirin-acetaminophen-caffeine (EXCEDRIN MIGRAINE) 250-250-65 MG per tablet Take 1 tablet by mouth every 6 (six) hours as needed.    . cetirizine (ZYRTEC) 10 MG tablet Take 10 mg by mouth daily.    . Cholecalciferol (VITAMIN D-1000 MAX ST) 1000 units tablet Take daily by mouth.    Marland Kitchen FLUoxetine (PROZAC) 20 MG capsule Take 1 capsule (20 mg total) by mouth daily. 90 capsule 3  . fluticasone (FLONASE) 50 MCG/ACT nasal spray Place 2 sprays into the nose as needed.     Marland Kitchen levonorgestrel (MIRENA) 20 MCG/24HR IUD 1 Intra Uterine Device (1 each total) by Intrauterine route once.    . metoprolol succinate (TOPROL-XL) 50 MG 24 hr tablet Take 1 tablet (50 mg total) by mouth daily. 90 tablet 3  . sodium  chloride (OCEAN) 0.65 % SOLN nasal spray Place 2 sprays into both nostrils every 2 (two) hours while awake. 1 Bottle 0  . valACYclovir (VALTREX) 1000 MG tablet TAKE AS DIRECTED 30 tablet 1  . Vitamins/Minerals TABS Take daily by mouth.     No current facility-administered medications on file prior to visit.     ALLERGIES: Amoxicillin; Ceftriaxone; Erythromycin; Levofloxacin; Rocephin [ceftriaxone sodium in dextrose]; Shellfish allergy; Tetracyclines & related; Amoxicillin-pot clavulanate; Clindamycin/lincomycin; Codeine; Latex; Levaquin [levofloxacin]; Sulfa antibiotics; and Vancomycin  Family History  Problem Relation Age of Onset  . Hypertension Mother   . Cancer Mother 73       Breast Ca  . Arthritis Mother   . Breast cancer Mother 68       2nd primary  . Cancer Father 81       Pancreatic  . Diabetes Father   . Hypertension Father   . Hyperlipidemia Father   . Heart disease Father   . Cancer Sister 21       Thyroid  . Cancer Maternal Grandmother        might have had cancer, died at 45  . Alcohol abuse Maternal Grandfather   . Pancreatic cancer Maternal Grandfather 52       died in his 73's  . Alcohol abuse Paternal Grandfather   . Heart disease Paternal Grandfather   . Hyperlipidemia Paternal Grandfather   . Hypertension Paternal Grandfather   . Other Paternal Grandmother        brain tumor on pitutary gland  . Brain cancer Paternal Grandmother 72       pituitary tumor, died in her 72's  . Breast cancer Cousin 12       maternal couisn, is now in her 52's  . Stomach cancer Maternal Aunt 75       is now 51  . Stomach cancer Maternal Aunt 75       died in 49's  . Breast cancer Cousin 47       maternal cousin, is now 91   SH:  Married, non smoker  Review of Systems  Constitutional: Negative.   Respiratory: Negative.   Cardiovascular: Negative.   Gastrointestinal: Negative.   Genitourinary: Negative for dysuria, frequency, hematuria and urgency.       Pelvic  pressure    PHYSICAL EXAMINATION:    BP 98/70 (BP Location: Right Arm, Patient Position: Sitting, Cuff Size: Large)   Pulse 88   Resp 16   Ht 5\' 2"  (1.575 m)   Wt 180 lb (81.6 kg)   BMI 32.92 kg/m  General appearance: alert, cooperative and appears stated age Abdomen: soft, non-tender; bowel sounds normal; no masses,  no organomegaly Lymph:  No inguinal LAD  Pelvic: External genitalia:  no lesions              Urethra:  normal appearing urethra with no masses, tenderness or lesions              Bartholins and Skenes: normal                 Vagina: normal appearing vagina with normal color and discharge, no lesions              Cervix: no lesions              Bimanual Exam:  Uterus:  normal size, contour, position, consistency, mobility, non-tender              Adnexa: no mass, fullness, tenderness              Anus:  no lesions  Procedure:  Speculum placed.  IUD string noted.  String grasped with ringed forceps.  IUD removed with one pull.  Pt tolerated procedure well.  IUD discarded.  Speculum removed.  Chaperone was present for exam.  Assessment: Pelvic pressure IUD removal today  Plan: Pt states she felt better after IUD removal.  She will monitor and give me and update in 1-2 weeks.

## 2017-10-06 LAB — URINE CULTURE: Organism ID, Bacteria: NO GROWTH

## 2017-11-09 ENCOUNTER — Telehealth: Payer: Self-pay | Admitting: Obstetrics & Gynecology

## 2017-11-09 ENCOUNTER — Encounter: Payer: Self-pay | Admitting: Obstetrics & Gynecology

## 2017-11-09 NOTE — Telephone Encounter (Signed)
Forwarding to Dr. Miller.

## 2017-11-09 NOTE — Telephone Encounter (Signed)
-----   Message from [place], Generic sent at 11/09/2017 12:37 PM EST -----    Good morning!  I just wanted to give you an update from my appointment on 10/05/17 from my IUD removal. I have not had any more pain or pressure thank goodness! I also have not had a period yet. Here's hoping that I won't! Should I be concerned if I don't?  Happy Holidays!  Hailey Wilkins

## 2017-11-09 NOTE — Telephone Encounter (Signed)
Please let pt know that it's ok she hasn't cycled yet as IUD was removed in November.  FSH was low so she will likely start bleeding again.  If has not had a cycle after 3 months from time IUD was removed--mid February, she does need to call back.  Thanks.

## 2017-11-10 NOTE — Telephone Encounter (Signed)
Spoke with patient, advised as seen below per Dr. Sabra Heck. Patient verbalizes understanding and is agreeable. Will close encounter.

## 2017-12-14 ENCOUNTER — Ambulatory Visit: Payer: BLUE CROSS/BLUE SHIELD | Admitting: Adult Health

## 2017-12-14 ENCOUNTER — Encounter: Payer: Self-pay | Admitting: Adult Health

## 2017-12-14 VITALS — BP 114/78 | HR 75 | Temp 98.3°F | Resp 16 | Ht 62.0 in | Wt 174.0 lb

## 2017-12-14 DIAGNOSIS — Z87898 Personal history of other specified conditions: Secondary | ICD-10-CM

## 2017-12-14 DIAGNOSIS — Z87892 Personal history of anaphylaxis: Secondary | ICD-10-CM

## 2017-12-14 DIAGNOSIS — Z889 Allergy status to unspecified drugs, medicaments and biological substances status: Secondary | ICD-10-CM | POA: Insufficient documentation

## 2017-12-14 DIAGNOSIS — Z881 Allergy status to other antibiotic agents status: Secondary | ICD-10-CM

## 2017-12-14 DIAGNOSIS — J069 Acute upper respiratory infection, unspecified: Secondary | ICD-10-CM

## 2017-12-14 DIAGNOSIS — J302 Other seasonal allergic rhinitis: Secondary | ICD-10-CM

## 2017-12-14 MED ORDER — PREDNISONE 10 MG (21) PO TBPK: ORAL_TABLET | ORAL | 0 refills | Status: DC

## 2017-12-14 NOTE — Patient Instructions (Addendum)
Upper Respiratory Infection, Adult Most upper respiratory infections (URIs) are caused by a virus. A URI affects the nose, throat, and upper air passages. The most common type of URI is often called "the common cold." Follow these instructions at home:  Take medicines only as told by your doctor.  Gargle warm saltwater or take cough drops to comfort your throat as told by your doctor.  Use a warm mist humidifier or inhale steam from a shower to increase air moisture. This may make it easier to breathe.  Drink enough fluid to keep your pee (urine) clear or pale yellow.  Eat soups and other clear broths.  Have a healthy diet.  Rest as needed.  Go back to work when your fever is gone or your doctor says it is okay. ? You may need to stay home longer to avoid giving your URI to others. ? You can also wear a face mask and wash your hands often to prevent spread of the virus.  Use your inhaler more if you have asthma.  Do not use any tobacco products, including cigarettes, chewing tobacco, or electronic cigarettes. If you need help quitting, ask your doctor. Contact a doctor if:  You are getting worse, not better.  Your symptoms are not helped by medicine.  You have chills.  You are getting more short of breath.  You have brown or red mucus.  You have yellow or brown discharge from your nose.  You have pain in your face, especially when you bend forward.  You have a fever.  You have puffy (swollen) neck glands.  You have pain while swallowing.  You have white areas in the back of your throat. Get help right away if:  You have very bad or constant: ? Headache. ? Ear pain. ? Pain in your forehead, behind your eyes, and over your cheekbones (sinus pain). ? Chest pain.  You have long-lasting (chronic) lung disease and any of the following: ? Wheezing. ? Long-lasting cough. ? Coughing up blood. ? A change in your usual mucus.  You have a stiff neck.  You have  changes in your: ? Vision. ? Hearing. ? Thinking. ? Mood. This information is not intended to replace advice given to you by your health care provider. Make sure you discuss any questions you have with your health care provider. Document Released: 04/25/2008 Document Revised: 07/10/2016 Document Reviewed: 02/12/2014 Elsevier Interactive Patient Education  2018 [employer].  Allergic Rhinitis, Adult Allergic rhinitis is an allergic reaction that affects the mucous membrane inside the nose. It causes sneezing, a runny or stuffy nose, and the feeling of mucus going down the back of the throat (postnasal drip). Allergic rhinitis can be mild to severe. There are two types of allergic rhinitis:  Seasonal. This type is also called hay fever. It happens only during certain seasons.  Perennial. This type can happen at any time of the year.  What are the causes? This condition happens when the body's defense system (immune system) responds to certain harmless substances called allergens as though they were germs.  Seasonal allergic rhinitis is triggered by pollen, which can come from grasses, trees, and weeds. Perennial allergic rhinitis may be caused by:  House dust mites.  Pet dander.  Mold spores.  What are the signs or symptoms? Symptoms of this condition include:  Sneezing.  Runny or stuffy nose (nasal congestion).  Postnasal drip.  Itchy nose.  Tearing of the eyes.  Trouble sleeping.  Daytime sleepiness.  How  is this diagnosed? This condition may be diagnosed based on:  Your medical history.  A physical exam.  Tests to check for related conditions, such as: ? Asthma. ? Pink eye. ? Ear infection. ? Upper respiratory infection.  Tests to find out which allergens trigger your symptoms. These may include skin or blood tests.  How is this treated? There is no cure for this condition, but treatment can help control symptoms. Treatment may include:  Taking  medicines that block allergy symptoms, such as antihistamines. Medicine may be given as a shot, nasal spray, or pill.  Avoiding the allergen.  Desensitization. This treatment involves getting ongoing shots until your body becomes less sensitive to the allergen. This treatment may be done if other treatments do not help.  If taking medicine and avoiding the allergen does not work, new, stronger medicines may be prescribed.  Follow these instructions at home:  Find out what you are allergic to. Common allergens include smoke, dust, and pollen.  Avoid the things you are allergic to. These are some things you can do to help avoid allergens: ? Replace carpet with wood, tile, or vinyl flooring. Carpet can trap dander and dust. ? Do not smoke. Do not allow smoking in your home. ? Change your heating and air conditioning filter at least once a month. ? During allergy season:  Keep windows closed as much as possible.  Plan outdoor activities when pollen counts are lowest. This is usually during the evening hours.  When coming indoors, change clothing and shower before sitting on furniture or bedding.  Take over-the-counter and prescription medicines only as told by your health care provider.  Keep all follow-up visits as told by your health care provider. This is important. Contact a health care provider if:  You have a fever.  You develop a persistent cough.  You make whistling sounds when you breathe (you wheeze).  Your symptoms interfere with your normal daily activities. Get help right away if:  You have shortness of breath. Summary  This condition can be managed by taking medicines as directed and avoiding allergens.  Contact your health care provider if you develop a persistent cough or fever.  During allergy season, keep windows closed as much as possible. This information is not intended to replace advice given to you by your health care provider. Make sure you discuss  any questions you have with your health care provider. Document Released: 08/02/2001 Document Revised: 12/15/2016 Document Reviewed: 12/15/2016 Elsevier Interactive Patient Education  2018 [employer].  Sinusitis, Adult Sinusitis is soreness and inflammation of your sinuses. Sinuses are hollow spaces in the bones around your face. They are located:  Around your eyes.  In the middle of your forehead.  Behind your nose.  In your cheekbones.  Your sinuses and nasal passages are lined with a stringy fluid (mucus). Mucus normally drains out of your sinuses. When your nasal tissues get inflamed or swollen, the mucus can get trapped or blocked so air cannot flow through your sinuses. This lets bacteria, viruses, and funguses grow, and that leads to infection. Follow these instructions at home: Medicines  Take, use, or apply over-the-counter and prescription medicines only as told by your doctor. These may include nasal sprays.  If you were prescribed an antibiotic medicine, take it as told by your doctor. Do not stop taking the antibiotic even if you start to feel better. Hydrate and Humidify  Drink enough water to keep your pee (urine) clear or pale yellow.  Use a cool mist humidifier to keep the humidity level in your home above 50%.  Breathe in steam for 10-15 minutes, 3-4 times a day or as told by your doctor. You can do this in the bathroom while a hot shower is running.  Try not to spend time in cool or dry air. Rest  Rest as much as possible.  Sleep with your head raised (elevated).  Make sure to get enough sleep each night. General instructions  Put a warm, moist washcloth on your face 3-4 times a day or as told by your doctor. This will help with discomfort.  Wash your hands often with soap and water. If there is no soap and water, use hand sanitizer.  Do not smoke. Avoid being around people who are smoking (secondhand smoke).  Keep all follow-up visits as told by  your doctor. This is important. Contact a doctor if:  You have a fever.  Your symptoms get worse.  Your symptoms do not get better within 10 days. Get help right away if:  You have a very bad headache.  You cannot stop throwing up (vomiting).  You have pain or swelling around your face or eyes.  You have trouble seeing.  You feel confused.  Your neck is stiff.  You have trouble breathing. This information is not intended to replace advice given to you by your health care provider. Make sure you discuss any questions you have with your health care provider. Document Released: 04/25/2008 Document Revised: 07/03/2016 Document Reviewed: 09/02/2015 Elsevier Interactive Patient Education  [employer].

## 2017-12-14 NOTE — Progress Notes (Signed)
Subjective:     Patient ID: Hailey Wilkins, female   DOB: 1971/10/06, 47 y.o.   MRN: [id number]  HPI  Patient is a 47 year old female in no acute distress who reports cough and sinus drainage x 2 weeks.  She reports she reports she has been using post nasal drip.  Mucinex and inhaler during this time. She has had Robitussin as well over the counter.  She denies any recent illness, recent sinusitis- she does have a history of chronic sinusitis listed but denies any in the past year.  Denies any ill contacts or recent hospitalizations.   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   Non productive cough. She continues have non productive cough.   Patient  denies any fever, chills, rash, chest pain, shortness of breath, nausea, vomiting, or diarrhea.  Blood pressure 114/78, pulse 75, temperature 98.3 F (36.8 C), resp. rate 16, height 5\' 2"  (1.575 m), weight 174 lb (78.9 kg), last menstrual period 12/15/1999, SpO2 98 %. Allergies  Allergen Reactions  . Amoxicillin Anaphylaxis  . Ceftriaxone Anaphylaxis  . Erythromycin Anaphylaxis  . Levofloxacin Anaphylaxis  . Rocephin [Ceftriaxone Sodium In Dextrose] Anaphylaxis  . Shellfish Allergy Anaphylaxis  . Tetracyclines & Related Anaphylaxis  . Amoxicillin-Pot Clavulanate Hives  . Clindamycin/Lincomycin Hives  . Codeine Hives  . Latex   . Levaquin [Levofloxacin]   . Sulfa Antibiotics Hives  . Vancomycin Rash   Patient Active Problem List   Diagnosis Date Noted  . Genetic testing 06/14/2017  . Family history of breast cancer   . Family history of stomach cancer   . Family history of pancreatic cancer   . Emotional lability 01/23/2015  . Pain of left calf 09/24/2013  . Asthma, mild intermitent 12/18/2012  . Allergic rhinitis 12/18/2012  . Sinus tachycardia 12/15/2012  . Chest pain on breathing 11/09/2012  . Migraine 05/31/2012  . Hot flashes 04/23/2012  . Atopic dermatitis 04/05/2012  .  Shellfish allergy 04/05/2012  . Sinusitis, chronic 02/13/2012  . Allergy, drug 02/13/2012    Current Outpatient Medications:  .  albuterol (VENTOLIN HFA) 108 (90 Base) MCG/ACT inhaler, Inhale 2 puffs into the lungs every 6 (six) hours as needed for wheezing or shortness of breath. For shortness of breath, Disp: 1 Inhaler, Rfl: 0 .  aspirin-acetaminophen-caffeine (EXCEDRIN MIGRAINE) 119-417-40 MG per tablet, Take 1 tablet by mouth every 6 (six) hours as needed., Disp: , Rfl:  .  cetirizine (ZYRTEC) 10 MG tablet, Take 10 mg by mouth daily., Disp: , Rfl:  .  Cholecalciferol (VITAMIN D-1000 MAX ST) 1000 units tablet, Take daily by mouth., Disp: , Rfl:  .  fluticasone (FLONASE) 50 MCG/ACT nasal spray, Place 2 sprays into the nose as needed. , Disp: , Rfl:  .  metoprolol succinate (TOPROL-XL) 50 MG 24 hr tablet, Take 1 tablet (50 mg total) by mouth daily., Disp: 90 tablet, Rfl: 3 .  sodium chloride (OCEAN) 0.65 % SOLN nasal spray, Place 2 sprays into both nostrils every 2 (two) hours while awake., Disp: 1 Bottle, Rfl: 0 .  valACYclovir (VALTREX) 1000 MG tablet, TAKE AS DIRECTED, Disp: 30 tablet, Rfl: 1 .  Vitamins/Minerals TABS, Take daily by mouth., Disp: , Rfl:  .  FLUoxetine (PROZAC) 20 MG capsule, Take 1 capsule (20 mg total) by mouth daily. (Patient not taking: Reported on 12/14/2017), Disp: 90 capsule, Rfl: 3 .  levonorgestrel (MIRENA) 20 MCG/24HR IUD, 1 Intra Uterine Device (1 each total) by Intrauterine route once. (  Patient not taking: Reported on 12/14/2017), Disp: , Rfl:   Review of Systems  Constitutional: Positive for fatigue (mild still working ). Negative for activity change, appetite change, chills, diaphoresis, fever and unexpected weight change.  HENT: Positive for congestion, ear pain (left ear " pressure " only ), postnasal drip, rhinorrhea and sinus pressure. Negative for mouth sores, sore throat and trouble swallowing.   Eyes: Negative.   Respiratory: Positive for wheezing  (occasional wheeze with this congestion x 2 weeks ). Negative for apnea, cough, choking, chest tightness, shortness of breath and stridor.   Cardiovascular: Negative.   Gastrointestinal: Negative.   Genitourinary: Negative.   Musculoskeletal: Negative.        Sore from coughing   Skin: Negative.   Allergic/Immunologic: Negative.   Neurological: Negative.   Hematological: Negative.   Psychiatric/Behavioral: Negative.        Objective:   Physical Exam  Constitutional: She is oriented to person, place, and time. Vital signs are normal. She appears well-developed and well-nourished. She is active.  Non-toxic appearance. She does not have a sickly appearance. She does not appear ill. No distress.  Patient moves on and off of exam table and in room without difficulty. Gait is normal in hall and in room. Patient is oriented to person place time and situation. Patient answers questions appropriately and engages in conversation.    HENT:  Head: Normocephalic and atraumatic.  Right Ear: Hearing, external ear and ear canal normal. Tympanic membrane is not perforated and not erythematous. A middle ear effusion is present. No decreased hearing is noted.  Left Ear: Hearing, external ear and ear canal normal. Tympanic membrane is not perforated and not erythematous. No decreased hearing is noted.  Nose: Mucosal edema and rhinorrhea present. Right sinus exhibits no maxillary sinus tenderness and no frontal sinus tenderness. Left sinus exhibits no maxillary sinus tenderness and no frontal sinus tenderness.  Mouth/Throat: Oropharynx is clear and moist. No oropharyngeal exudate.  Eyes: Conjunctivae, EOM and lids are normal. Pupils are equal, round, and reactive to light. Right eye exhibits no discharge. Left eye exhibits no discharge. No scleral icterus.  Neck: Normal range of motion. Neck supple. No JVD present. No tracheal deviation present.  Cardiovascular: Normal rate, regular rhythm, S1 normal, S2  normal, normal heart sounds and intact distal pulses. Exam reveals no gallop, no distant heart sounds and no friction rub.  No murmur heard. Pulmonary/Chest: Effort normal and breath sounds normal. No stridor. No respiratory distress. She has no wheezes. She has no rales. She exhibits no tenderness.  Abdominal: Soft. Bowel sounds are normal. She exhibits no distension.  Musculoskeletal: Normal range of motion.  Lymphadenopathy:       Head (right side): No submental, no submandibular, no tonsillar, no preauricular, no posterior auricular and no occipital adenopathy present.       Head (left side): No submental, no submandibular, no tonsillar, no preauricular, no posterior auricular and no occipital adenopathy present.    She has no cervical adenopathy.    She has no axillary adenopathy.  Neurological: She is alert and oriented to person, place, and time. She has normal strength. She displays normal reflexes. No cranial nerve deficit. She exhibits normal muscle tone. She displays a negative Romberg sign. Coordination normal.  Skin: Skin is warm and dry. No rash noted. She is not diaphoretic. No erythema. No pallor.  Psychiatric: She has a normal mood and affect. Her behavior is normal. Judgment and thought content normal.  Vitals reviewed.  Assessment:     Viral upper respiratory tract infection  History of wheezing  Seasonal allergic rhinitis, unspecified trigger     Plan:     Meds ordered this encounter  Medications  . predniSONE (STERAPRED UNI-PAK 21 TAB) 10 MG (21) TBPK tablet    Sig: By mouth Take 6 tablets on day 1, Take 5 tablets day 2 Take 4 tablets day 3 Take 3 tablets day 4 Take 2 tablets day five 5 Take 1 tablet day    Dispense:  21 tablet    Refill:  0   Patient reports she does well with Prednisone and has taken in the past, she has had wheezing at home that is reported as mild.   Continue Mucinex per package instructions.   Advised she should follow up with her  ENT as well with history.   Advised with severe allergies to antibiotics she should be seen in the Emergency room immediately if any symptoms worsen at all or fever develop or if any symptoms change or worsen. Advised ER or urgent Care if after hours or on weekend. 911 for emergency symptoms at any time.  Signs nd symptoms of bacterial infection discussed thou roughly with patient.   Patient verbalized understanding of all instructions given and denies any further questions at this time.

## 2017-12-21 ENCOUNTER — Ambulatory Visit: Payer: BLUE CROSS/BLUE SHIELD | Admitting: Adult Health

## 2017-12-21 ENCOUNTER — Encounter: Payer: Self-pay | Admitting: Adult Health

## 2017-12-21 VITALS — BP 104/64 | HR 76 | Temp 97.7°F | Resp 16 | Ht 62.0 in | Wt 178.0 lb

## 2017-12-21 DIAGNOSIS — R059 Cough, unspecified: Secondary | ICD-10-CM

## 2017-12-21 DIAGNOSIS — R0989 Other specified symptoms and signs involving the circulatory and respiratory systems: Secondary | ICD-10-CM

## 2017-12-21 DIAGNOSIS — R05 Cough: Secondary | ICD-10-CM

## 2017-12-21 DIAGNOSIS — Z881 Allergy status to other antibiotic agents status: Secondary | ICD-10-CM

## 2017-12-21 NOTE — Patient Instructions (Addendum)
Go to the Emergency Room - Do not self Drive.  Cough, Adult A cough helps to clear your throat and lungs. A cough may last only 2-3 weeks (acute), or it may last longer than 8 weeks (chronic). Many different things can cause a cough. A cough may be a sign of an illness or another medical condition. Follow these instructions at home:  Pay attention to any changes in your cough.  Take medicines only as told by your doctor. ? If you were prescribed an antibiotic medicine, take it as told by your doctor. Do not stop taking it even if you start to feel better. ? Talk with your doctor before you try using a cough medicine.  Drink enough fluid to keep your pee (urine) clear or pale yellow.  If the air is dry, use a cold steam vaporizer or humidifier in your home.  Stay away from things that make you cough at work or at home.  If your cough is worse at night, try using extra pillows to raise your head up higher while you sleep.  Do not smoke, and try not to be around smoke. If you need help quitting, ask your doctor.  Do not have caffeine.  Do not drink alcohol.  Rest as needed. Contact a doctor if:  You have new problems (symptoms).  You cough up yellow fluid (pus).  Your cough does not get better after 2-3 weeks, or your cough gets worse.  Medicine does not help your cough and you are not sleeping well.  You have pain that gets worse or pain that is not helped with medicine.  You have a fever.  You are losing weight and you do not know why.  You have night sweats. Get help right away if:  You cough up blood.  You have trouble breathing.  Your heartbeat is very fast. This information is not intended to replace advice given to you by your health care provider. Make sure you discuss any questions you have with your health care provider. Document Released: 07/21/2011 Document Revised: 04/14/2016 Document Reviewed: 01/14/2015 Elsevier Interactive Patient Education  [employer].

## 2017-12-21 NOTE — Progress Notes (Signed)
Subjective:     Patient ID: Hailey Wilkins, female   DOB: 28-Dec-1970, 47 y.o.   MRN: [id number]  HPI   Blood pressure 104/64, pulse 76, temperature 97.7 F (36.5 C), resp. rate 16, height 5\' 2"  (1.575 m), weight 178 lb (80.7 kg), SpO2 98 %.  Patient is a 47 year old female in no acute distress who presents for follow up due to not feeling much better from visit on 12/14/17 after having sinus drainage 2 weeks prior and was diagnosed with  viral upper respiratory infection / wheezing - she was given Prednisone Dose Pack for 5 days and completed on Monday.  She has severe allergies to antibiotics.   She reports she has had a non productive cough since last visit that is not improving, she reports she has a nebulizer at home with albuterol and used it. She denies that this helped her.   She reports sinus symptoms/ear  is better.  She reports she felt better on Saturday and then chest congestion returned back worse than it was on the day before.  She reports shortness of breath with sitting and activity.  She denies any chest pain, body aches fever or chills. She reports at times the past two days I have felt like I am " smothering".     Normal speech pattern, not using accessory muscles to breath ad oxygen saturation normal at office visit today.   She denies any edema. Denies any recent air travel. She has  Mirena. Denies any trauma.  Allergies  Allergen Reactions  . Amoxicillin Anaphylaxis  . Ceftriaxone Anaphylaxis  . Erythromycin Anaphylaxis  . Levofloxacin Anaphylaxis  . Rocephin [Ceftriaxone Sodium In Dextrose] Anaphylaxis  . Shellfish Allergy Anaphylaxis  . Tetracyclines & Related Anaphylaxis  . Amoxicillin-Pot Clavulanate Hives  . Clindamycin/Lincomycin Hives  . Codeine Hives  . Latex   . Levaquin [Levofloxacin]   . Sulfa Antibiotics Hives  . Vancomycin Rash     No LMP recorded. Patient is not currently having periods (Reason: IUD). 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 Negative for appetite change, chills, diaphoresis, fever and unexpected weight change.  HENT: Positive for congestion. Negative for dental problem, drooling, ear discharge, ear pain, facial swelling, hearing loss, mouth sores, nosebleeds, postnasal drip, rhinorrhea, sinus pressure, sinus pain, sneezing, sore throat, tinnitus, trouble swallowing and voice change.   Eyes: Negative.   Respiratory: Positive for cough, chest tightness, shortness of breath and wheezing (at home used nebulizers ). Negative for apnea, choking and stridor.   Cardiovascular: Negative for chest pain, palpitations and leg swelling.  Gastrointestinal: Negative.   Genitourinary: Negative.   Musculoskeletal: Negative.   Skin: Negative.   Allergic/Immunologic:       Anaphylaxis to antibiotics   -- Amoxicillin -- Anaphylaxis  -- Ceftriaxone -- Anaphylaxis  -- Erythromycin -- Anaphylaxis  -- Levofloxacin -- Anaphylaxis  -- Rocephin (Ceftriaxone Sodium In Dextrose) -- Anaphylaxis  -- Shellfish Allergy -- Anaphylaxis  -- Tetracyclines & Related -- Anaphylaxis  -- Amoxicillin-Pot Clavulanate -- Hives  -- Clindamycin/Lincomycin -- Hives  -- Codeine -- Hives  -- Latex   -- Levaquin (Levofloxacin)   -- Sulfa Antibiotics -- Hives  -- Vancomycin -- Rash   Neurological: Negative.   Hematological: Negative.   Psychiatric/Behavioral: Negative.        Objective:  Physical Exam  Constitutional: She appears well-developed and well-nourished. No distress.  HENT:  Head: Normocephalic and atraumatic.  Eyes: Conjunctivae and EOM are normal. Pupils are equal, round, and reactive to light.  Neck: Normal range of motion. Neck supple. No JVD present. No tracheal deviation present.  Pulmonary/Chest: Effort normal. No accessory muscle usage or stridor.  No respiratory distress. She has no decreased breath sounds. She has no wheezes. She has rhonchi in the left lower field. She has no rales.  Abdominal: Soft. Bowel sounds are normal.  Musculoskeletal:  Patient moves on and off of exam table and in room without difficulty. Gait is normal in hall and in room. Patient is oriented to person place time and situation. Patient answers questions appropriately and engages in conversation.   Lymphadenopathy:    She has no cervical adenopathy.  Skin: Skin is warm, dry and intact. She is not diaphoretic. No cyanosis. No pallor. Nails show no clubbing.  Psychiatric: She has a normal mood and affect. Her speech is normal and behavior is normal. Judgment and thought content normal. Cognition and memory are normal.       Assessment:     Rhonchi at left lung base  Cough  Allergy to multiple antibiotics      Plan:     Given patients symptoms and assessment recommend Emergency Room evaluation as patient is requesting to  Repeat  the prednisone dose pack however provider does not recommend this as bacterial infection, pneumonia and other etiology should be ruled out given ROS and Physical.  She does not have any active wheezing at this time and declines DuoNeb in office.   Multiple severe antibiotic allergies and if bacterial will need inpatient treatment likely.    She is also recommended  to see her ENT as history of chronic sinusitis and educated on risks of repeat sinusitis including malignancy.     Call 911 for any emergency symptoms. Emergency room no-not to self drive.  Patient verbalized understanding of all instructions given and denies any further questions at this time.

## 2018-01-17 ENCOUNTER — Encounter: Payer: Self-pay | Admitting: Obstetrics & Gynecology

## 2018-01-18 DIAGNOSIS — H16223 Keratoconjunctivitis sicca, not specified as Sjogren's, bilateral: Secondary | ICD-10-CM | POA: Diagnosis not present

## 2018-01-18 DIAGNOSIS — H04123 Dry eye syndrome of bilateral lacrimal glands: Secondary | ICD-10-CM | POA: Diagnosis not present

## 2018-01-19 ENCOUNTER — Telehealth: Payer: Self-pay | Admitting: Obstetrics & Gynecology

## 2018-01-19 NOTE — Telephone Encounter (Signed)
Patient states she had her IUD taken out in November 2018. She states she thinks she needs to come in for blood work.

## 2018-01-19 NOTE — Telephone Encounter (Signed)
Spoke with patient, calling to schedule OV with Dr. Sabra Heck, see MyChart message dated 01/17/18.   Patient reports pelvic cramping last night, "felt like ovaries were going to explode", took 4 Advil, was worse this morning, not as severe this afternoon, 5/10 pain scale. Menses has not started, denies any other symptoms.   OV scheduled for 3/4 at 3:45pm with Dr. Sabra Heck. Advised should new symptoms develop or pain becomes severe, seek immediate care at local ER/[facility] after hours. Patient verbalizes understanding and is agreeable.   Routing to provider for final review. Patient is agreeable to disposition. Will close encounter.

## 2018-01-22 ENCOUNTER — Encounter: Payer: Self-pay | Admitting: Obstetrics & Gynecology

## 2018-01-22 ENCOUNTER — Other Ambulatory Visit: Payer: Self-pay

## 2018-01-22 ENCOUNTER — Ambulatory Visit: Payer: BLUE CROSS/BLUE SHIELD | Admitting: Obstetrics & Gynecology

## 2018-01-22 VITALS — BP 110/80 | HR 80 | Resp 16 | Wt 179.0 lb

## 2018-01-22 DIAGNOSIS — E229 Hyperfunction of pituitary gland, unspecified: Secondary | ICD-10-CM

## 2018-01-22 DIAGNOSIS — N912 Amenorrhea, unspecified: Secondary | ICD-10-CM | POA: Diagnosis not present

## 2018-01-22 DIAGNOSIS — R7989 Other specified abnormal findings of blood chemistry: Secondary | ICD-10-CM

## 2018-01-22 LAB — POCT URINE PREGNANCY: Preg Test, Ur: NEGATIVE

## 2018-01-22 NOTE — Progress Notes (Signed)
prolaGYNECOLOGY  VISIT  CC:   Pelvic fullness,   HPI: 47 y.o. G2P2 Married Caucasian female here for complaint of amenorrhea since IUD removal on 10/05/17.    Reports bloating has been within the last two weeks.  Reports she stopped drinking beer as she thought this was part of the problem.  Has tried to cut out bread and sugar.  Taking 800mg  motrin as needed for cramping.    Does report significant left nipple tenderness.  Nipple was hard as well.  This has improved over the past two to three days.  Denies nipple discharge.    Has also had a little bit of acne which is unusual for her.    Does not feel particular change in mood.  Stopped her prozac in December.  Had a URI in December and she didn't take the prozac for several days.  She just decided to stop it and see how she did.  Feels like she is a little more "teary eyed" than she used to be.    GYNECOLOGIC HISTORY: Patient's last menstrual period was 12/02/2017. Contraception: Condoms  Menopausal hormone therapy: none  Patient Active Problem List   Diagnosis Date Noted  . Hx of anaphylaxis 12/14/2017  . Allergy to multiple antibiotics 12/14/2017  . Genetic testing 06/14/2017  . Family history of breast cancer   . Family history of stomach cancer   . Family history of pancreatic cancer   . Emotional lability 01/23/2015  . Pain of left calf 09/24/2013  . Asthma, mild intermitent 12/18/2012  . Allergic rhinitis 12/18/2012  . Sinus tachycardia 12/15/2012  . Chest pain on breathing 11/09/2012  . Migraine 05/31/2012  . Hot flashes 04/23/2012  . Atopic dermatitis 04/05/2012  . Shellfish allergy 04/05/2012  . Sinusitis, chronic 04/05/2012  . Sinusitis, chronic 02/13/2012  . Allergy, drug 02/13/2012    Past Medical History:  Diagnosis Date  . Allergic rhinitis    Dr. Doreene Nest, s/p allergy testing  . Anemia   . Asthma   . Basal cell carcinoma of anterior chest    removed  . Breast cyst 2011   bx benign  . Depression     after father died, on [place] in past  . Family history of breast cancer   . Family history of pancreatic cancer   . Family history of stomach cancer   . GERD (gastroesophageal reflux disease)   . Gestational diabetes    with first child  . Headache(784.0)   . History of chicken pox   . Internal hemorrhoid 2006   dx during colon w/Dr Tiffany Kocher  . Migraines    associated with menses in past, tx with excedrin, may last up to 1 week  . Migraines   . Pericarditis 12/13  . Shingles 04/2011  . Sinusitis   . UTI (lower urinary tract infection)     Past Surgical History:  Procedure Laterality Date  . BASAL CELL CARCINOMA EXCISION  2011   removed from chest/[place] [facility]  . BREAST CYST ASPIRATION Left 2011   neg  . CHOLECYSTECTOMY  2010   [facility]  Marland Kitchen COLONOSCOPY    . INTRAUTERINE DEVICE INSERTION  01/2011   Dr Sabra Heck  . KNEE ARTHROSCOPY W/ MENISCAL REPAIR  1989   Right, Dr. Mauri Pole  . NASAL SINUS SURGERY  1999, 2005  . REDUCTION MAMMAPLASTY  2017  . TONSILLECTOMY  1988  . VAGINAL DELIVERY     x 2    MEDS:   Current Outpatient Medications  on File Prior to Visit  Medication Sig Dispense Refill  . albuterol (VENTOLIN HFA) 108 (90 Base) MCG/ACT inhaler Inhale 2 puffs into the lungs every 6 (six) hours as needed for wheezing or shortness of breath. For shortness of breath 1 Inhaler 0  . aspirin-acetaminophen-caffeine (EXCEDRIN MIGRAINE) 250-250-65 MG per tablet Take 1 tablet by mouth every 6 (six) hours as needed.    . cetirizine (ZYRTEC) 10 MG tablet Take 10 mg by mouth daily.    . Cholecalciferol (VITAMIN D-1000 MAX ST) 1000 units tablet Take daily by mouth.    . fluticasone (FLONASE) 50 MCG/ACT nasal spray Place 2 sprays into the nose as needed.     Marland Kitchen Lifitegrast (XIIDRA) 5 % SOLN     . metoprolol succinate (TOPROL-XL) 50 MG 24 hr tablet Take 1 tablet (50 mg total) by mouth daily. 90 tablet 3  . valACYclovir (VALTREX) 1000 MG tablet TAKE AS DIRECTED 30  tablet 1  . Vitamins/Minerals TABS Take daily by mouth.     No current facility-administered medications on file prior to visit.     ALLERGIES: Amoxicillin; Ceftriaxone; Erythromycin; Levofloxacin; Rocephin [ceftriaxone sodium in dextrose]; Shellfish allergy; Tetracyclines & related; Amoxicillin-pot clavulanate; Clindamycin/lincomycin; Codeine; Latex; Levaquin [levofloxacin]; Sulfa antibiotics; and Vancomycin  Family History  Problem Relation Age of Onset  . Hypertension Mother   . Cancer Mother 79       Breast Ca  . Arthritis Mother   . Breast cancer Mother 52       2nd primary  . Cancer Father 36       Pancreatic  . Diabetes Father   . Hypertension Father   . Hyperlipidemia Father   . Heart disease Father   . Cancer Sister 38       Thyroid  . Cancer Maternal Grandmother        might have had cancer, died at 50  . Alcohol abuse Maternal Grandfather   . Pancreatic cancer Maternal Grandfather 28       died in his 79's  . Alcohol abuse Paternal Grandfather   . Heart disease Paternal Grandfather   . Hyperlipidemia Paternal Grandfather   . Hypertension Paternal Grandfather   . Other Paternal Grandmother        brain tumor on pitutary gland  . Brain cancer Paternal Grandmother 64       pituitary tumor, died in her 49's  . Breast cancer Cousin 40       maternal couisn, is now in her 19's  . Stomach cancer Maternal Aunt 75       is now 52  . Stomach cancer Maternal Aunt 75       died in 55's  . Breast cancer Cousin 32       maternal cousin, is now 50    SH:  Married, non smoker  Review of Systems  Constitutional:       Weight gain   HENT: Positive for congestion.   Gastrointestinal: Positive for abdominal pain.       Bloating Change in stools   Skin:       Breast pain   All other systems reviewed and are negative.   PHYSICAL EXAMINATION:    BP 110/80 (BP Location: Right Arm, Patient Position: Sitting, Cuff Size: Large)   Pulse 80   Resp 16   Wt 179 lb (81.2  kg)   LMP 12/02/2017   BMI 32.74 kg/m     General appearance: alert, cooperative and appears stated age Abdomen:  soft, non-tender; bowel sounds normal; no masses,  no organomegaly  Pelvic: External genitalia:  no lesions              Urethra:  normal appearing urethra with no masses, tenderness or lesions              Bartholins and Skenes: normal                 Vagina: normal appearing vagina with normal color and discharge, no lesions              Cervix: no lesions              Bimanual Exam:  Uterus:  normal size, contour, position, consistency, mobility, non-tender              Adnexa: no mass, fullness, tenderness              Rectovaginal: No..  Confirms.              Anus:  normal sphincter tone, no lesions  Chaperone was present for exam.  Assessment: Amenorrhea after IUD removal Bloating Moodiness  Plan: FSH, TSH, and prolactin will be obtained today.  If [place] is low, will use provera challenge to start cycle.

## 2018-01-23 LAB — TSH: TSH: 4.32 u[IU]/mL (ref 0.450–4.500)

## 2018-01-23 LAB — FOLLICLE STIMULATING HORMONE: FSH: 2.6 m[IU]/mL

## 2018-01-23 LAB — PROLACTIN: Prolactin: 23 ng/mL (ref 4.8–23.3)

## 2018-01-25 ENCOUNTER — Encounter: Payer: Self-pay | Admitting: Obstetrics & Gynecology

## 2018-01-25 ENCOUNTER — Telehealth: Payer: Self-pay | Admitting: Obstetrics & Gynecology

## 2018-01-25 MED ORDER — MEDROXYPROGESTERONE ACETATE 10 MG PO TABS: 10.0000 mg | ORAL_TABLET | Freq: Every day | ORAL | 0 refills | Status: DC

## 2018-01-25 NOTE — Telephone Encounter (Signed)
-----   Message from Megan Salon, MD sent at 01/24/2018  7:01 AM EST ----- Please let patient know her thyroid and FSH were normal.  She needs to take provera 10mg  x 10 days to trigger a cycle.  Her prolactin was mildly increased.  She needs to repeat this first in the morning without any breast stimulation for 24 hours including no hot shower on her breasts the morning of the test.  Lab order has been placed.

## 2018-01-25 NOTE — Telephone Encounter (Signed)
Patient sent the following correspondence through [place]. Routing to triage to assist patient with request.     ----- Message from [place], Generic sent at 01/25/2018 11:00 AM EST -----    Hi!  Just checking to see if there are any results from my blood work on Monday.   Thanks!  Hailey Wilkins

## 2018-01-25 NOTE — Telephone Encounter (Signed)
Spoke with patient, advised as seen below per Dr. Sabra Heck. Provera to verified pharmacy. Fasting lab appt scheduled for 01/31/18 at 8:40am. Patient verbalizes understanding and is agreeable.   Routing to provider for final review. Patient is agreeable to disposition. Will close encounter.

## 2018-01-31 ENCOUNTER — Other Ambulatory Visit (INDEPENDENT_AMBULATORY_CARE_PROVIDER_SITE_OTHER): Payer: BLUE CROSS/BLUE SHIELD

## 2018-01-31 DIAGNOSIS — E229 Hyperfunction of pituitary gland, unspecified: Secondary | ICD-10-CM

## 2018-01-31 DIAGNOSIS — R7989 Other specified abnormal findings of blood chemistry: Secondary | ICD-10-CM

## 2018-02-01 ENCOUNTER — Telehealth: Payer: Self-pay | Admitting: *Deleted

## 2018-02-01 ENCOUNTER — Encounter: Payer: Self-pay | Admitting: Obstetrics & Gynecology

## 2018-02-01 LAB — PROLACTIN: Prolactin: 12.1 ng/mL (ref 4.8–23.3)

## 2018-02-01 NOTE — Telephone Encounter (Signed)
Spoke with patient. Advised patient after taking 10 days of Provera should expect to have a withdrawal bleed. Can take up to 14 days after completion of medication to have menses. Advised After withdrawal cycle body will hopefully return to have monthly cycles. Advised if she misses 3 cycles in a row should contact the office or if she does not have a menses after completing medicaiton. Patient is returning for Prolactin recheck on 01/31/2017.  Routing to provider for final review.

## 2018-02-01 NOTE — Telephone Encounter (Signed)
-----   Message from [place], Generic sent at 02/01/2018 7:01 AM EDT -----    Doristine Devoid! So after I take this medicine I should have a cycle and things will go back to normal? Will I continue to have a cycle?   Olivia Mackie

## 2018-02-12 DIAGNOSIS — N62 Hypertrophy of breast: Secondary | ICD-10-CM | POA: Diagnosis not present

## 2018-03-16 ENCOUNTER — Ambulatory Visit: Payer: BLUE CROSS/BLUE SHIELD | Admitting: Adult Health

## 2018-04-09 DIAGNOSIS — H04123 Dry eye syndrome of bilateral lacrimal glands: Secondary | ICD-10-CM | POA: Diagnosis not present

## 2018-04-23 ENCOUNTER — Encounter: Payer: Self-pay | Admitting: Obstetrics & Gynecology

## 2018-04-23 ENCOUNTER — Telehealth: Payer: Self-pay | Admitting: Obstetrics & Gynecology

## 2018-04-23 DIAGNOSIS — N912 Amenorrhea, unspecified: Secondary | ICD-10-CM

## 2018-04-23 NOTE — Addendum Note (Signed)
Addended by: Burnice Logan on: 04/23/2018 05:38 PM   Modules accepted: Orders

## 2018-04-23 NOTE — Telephone Encounter (Signed)
She could have her [place] repeated if she wants just to see where she is from a menopausal standpoint.  If this is low, it's not perimenopause.  I can order lab if she wants to proceed.

## 2018-04-23 NOTE — Telephone Encounter (Signed)
Patient sent the following correspondence through [place]. Routing to triage to assist patient with request.  ----- Message from [place], Generic sent at 04/23/2018 7:43 AM EDT -----    Good morning.   Hope you are doing well! I am following up with regards to my cycle. My last cycle was on March 21 when you gave me that medicine to make me have one. Your nurse said to reach back out if I didn't have another one for 3 months which will be the 21st if this month. Do I need to come back in?   Thanks!  Hailey Wilkins

## 2018-04-23 NOTE — Telephone Encounter (Signed)
Spoke with patient. LMP 02/08/18, started with provera. 01/22/18 FSH 2.6, prolactin 23.   Reports increase in hot flashes over the last month and increased moodiness. Is unsure if mood changes are r/t son graduating high school or menopause.  Waking up during the night because of hot flashes.   Denies any other GYN symptoms or pain.   Stopped taking prozac in 10/2017, does not wish to restart at this time.   Advised patient to continue to monitor cycle, if no menses after 6/21, return call to office. Offered OV to discuss hot flashes and mood changes, patient declined at this time. Advised Dr. Sabra Heck will review, our office will return call with any additional recommendations.   Routing to provider for final review. Patient is agreeable to disposition. Will close encounter.

## 2018-04-23 NOTE — Telephone Encounter (Signed)
Spoke with patient, advised as seen below per Dr. Sabra Heck. Lab appt scheduled for 04/26/18 at 11am.   Future lab order placed for [facility].   Routing to provider for final review. Patient is agreeable to disposition. Will close encounter.

## 2018-04-23 NOTE — Telephone Encounter (Signed)
Left message to call Hailey Wilkins at [phone].  

## 2018-04-26 ENCOUNTER — Other Ambulatory Visit (INDEPENDENT_AMBULATORY_CARE_PROVIDER_SITE_OTHER): Payer: BLUE CROSS/BLUE SHIELD

## 2018-04-26 DIAGNOSIS — D225 Melanocytic nevi of trunk: Secondary | ICD-10-CM | POA: Diagnosis not present

## 2018-04-26 DIAGNOSIS — D2262 Melanocytic nevi of left upper limb, including shoulder: Secondary | ICD-10-CM | POA: Diagnosis not present

## 2018-04-26 DIAGNOSIS — N912 Amenorrhea, unspecified: Secondary | ICD-10-CM

## 2018-04-26 DIAGNOSIS — D2261 Melanocytic nevi of right upper limb, including shoulder: Secondary | ICD-10-CM | POA: Diagnosis not present

## 2018-04-26 DIAGNOSIS — D2272 Melanocytic nevi of left lower limb, including hip: Secondary | ICD-10-CM | POA: Diagnosis not present

## 2018-04-27 LAB — FOLLICLE STIMULATING HORMONE: FSH: 97.7 m[IU]/mL

## 2018-04-28 ENCOUNTER — Encounter: Payer: Self-pay | Admitting: Obstetrics & Gynecology

## 2018-04-30 ENCOUNTER — Telehealth: Payer: Self-pay | Admitting: Obstetrics & Gynecology

## 2018-04-30 NOTE — Telephone Encounter (Signed)
Dr. Miller, please advise.

## 2018-04-30 NOTE — Telephone Encounter (Signed)
Patient sent the following correspondence through [place]. Routing to triage to assist patient with request.  ----- Message from [place], Generic sent at 04/30/2018 5:54 AM EDT -----    ok. Sounds like I might be in for a roller coaster ride. Do you think they would fluctuate to the point that I would be out of menopause and is there another reason the level could be so high? Stress? Diet??    ----- Message -----  From: Megan Salon, MD  Sent: 04/29/18, 11:17 PM  To: Myles Rosenthal [place]  Subject: RE: Visit Follow-Up Question    I think you should continue to use condoms due to how quickly this level increased. I think it will likely fluctuate some the next several months. I will repeat this again when I see you in August for your exam. Hope that helps.    Edwinna Areola      ----- Message -----   From: Myles Rosenthal Resurreccion   Sent: 04/28/2018 9:01 AM EDT    To: Megan Salon, MD  Subject: Visit Follow-Up Question    I do have one question. Can I get pregnant at this point? Sherren Mocha is not fixed and We have been using using condoms. Need I say more?? Haha.  Olivia Mackie

## 2018-05-03 ENCOUNTER — Encounter: Payer: Self-pay | Admitting: Obstetrics & Gynecology

## 2018-05-03 NOTE — Telephone Encounter (Signed)
Answered pt's question via mychart.  Ok to close encounter.

## 2018-05-28 DIAGNOSIS — R002 Palpitations: Secondary | ICD-10-CM | POA: Diagnosis not present

## 2018-05-28 DIAGNOSIS — E559 Vitamin D deficiency, unspecified: Secondary | ICD-10-CM | POA: Diagnosis not present

## 2018-05-28 DIAGNOSIS — Z0000 Encounter for general adult medical examination without abnormal findings: Secondary | ICD-10-CM | POA: Diagnosis not present

## 2018-05-28 DIAGNOSIS — Z1389 Encounter for screening for other disorder: Secondary | ICD-10-CM | POA: Diagnosis not present

## 2018-06-27 ENCOUNTER — Other Ambulatory Visit: Payer: Self-pay | Admitting: Obstetrics & Gynecology

## 2018-06-27 DIAGNOSIS — Z1231 Encounter for screening mammogram for malignant neoplasm of breast: Secondary | ICD-10-CM

## 2018-07-11 ENCOUNTER — Ambulatory Visit: Payer: BLUE CROSS/BLUE SHIELD | Admitting: Nurse Practitioner

## 2018-07-13 ENCOUNTER — Ambulatory Visit
Admission: RE | Admit: 2018-07-13 | Discharge: 2018-07-13 | Disposition: A | Discharge status: Home / Self Care | Payer: BLUE CROSS/BLUE SHIELD | Source: Ambulatory Visit | Attending: Obstetrics & Gynecology | Admitting: Obstetrics & Gynecology

## 2018-07-13 ENCOUNTER — Encounter: Payer: Self-pay | Admitting: Radiology

## 2018-07-13 DIAGNOSIS — Z1231 Encounter for screening mammogram for malignant neoplasm of breast: Secondary | ICD-10-CM | POA: Insufficient documentation

## 2018-07-16 ENCOUNTER — Other Ambulatory Visit: Payer: Self-pay | Admitting: Obstetrics & Gynecology

## 2018-07-16 ENCOUNTER — Other Ambulatory Visit: Payer: Self-pay

## 2018-07-16 ENCOUNTER — Ambulatory Visit: Payer: BLUE CROSS/BLUE SHIELD | Admitting: Obstetrics & Gynecology

## 2018-07-16 ENCOUNTER — Other Ambulatory Visit (HOSPITAL_COMMUNITY)
Admission: RE | Admit: 2018-07-16 | Discharge: 2018-07-16 | Disposition: A | Discharge status: Home / Self Care | Payer: BLUE CROSS/BLUE SHIELD | Source: Ambulatory Visit | Attending: Obstetrics & Gynecology | Admitting: Obstetrics & Gynecology

## 2018-07-16 ENCOUNTER — Encounter: Payer: Self-pay | Admitting: Obstetrics & Gynecology

## 2018-07-16 VITALS — BP 110/80 | HR 72 | Resp 16 | Ht 62.0 in | Wt 157.6 lb

## 2018-07-16 DIAGNOSIS — Z01419 Encounter for gynecological examination (general) (routine) without abnormal findings: Secondary | ICD-10-CM | POA: Diagnosis not present

## 2018-07-16 DIAGNOSIS — Z124 Encounter for screening for malignant neoplasm of cervix: Secondary | ICD-10-CM | POA: Insufficient documentation

## 2018-07-16 DIAGNOSIS — N951 Menopausal and female climacteric states: Secondary | ICD-10-CM | POA: Diagnosis not present

## 2018-07-16 DIAGNOSIS — Z1211 Encounter for screening for malignant neoplasm of colon: Secondary | ICD-10-CM | POA: Diagnosis not present

## 2018-07-16 DIAGNOSIS — N6310 Unspecified lump in the right breast, unspecified quadrant: Secondary | ICD-10-CM

## 2018-07-16 DIAGNOSIS — Z23 Encounter for immunization: Secondary | ICD-10-CM

## 2018-07-16 DIAGNOSIS — R928 Other abnormal and inconclusive findings on diagnostic imaging of breast: Secondary | ICD-10-CM

## 2018-07-16 DIAGNOSIS — R921 Mammographic calcification found on diagnostic imaging of breast: Secondary | ICD-10-CM

## 2018-07-16 MED ORDER — VALACYCLOVIR HCL 1 G PO TABS: ORAL_TABLET | ORAL | 3 refills | Status: DC

## 2018-07-16 NOTE — Progress Notes (Signed)
47 y.o. G2P2 MarriedCaucasianF here for annual exam.  Doing well.  Was unaware of her mammogram.  D/w her today.    Has not had a cycle since 3/19.  [place] in June was 97.  Planning to recheck today.  Reports she is more tearful as well.  Feels she is a little moodier.  D/w pt treatment options.  Considering St. John's Wort vs Paxil.    Son went to [place].    Pt did have negative genetic testing last year.  Patient's last menstrual period was 01/19/2018 (approximate).          Sexually active: Yes.    The current method of family planning is condoms every time.    Exercising: Yes.    walking Smoker:  no  Health Maintenance: Pap:  02/11/16 Neg. HR HPV:neg   12/02/13 Neg  History of abnormal Pap:  Yes, remote hx MMG:  07/13/18 BIRADS0:Incomplete. Needs MMG diagnostic/US bilateral. Colonoscopy: 2006  BMD:   Never TDaP:  2009 Pneumonia vaccine(s):  2008 Shingrix: No Hep C testing: n/a Screening Labs: Here today   reports that she has never smoked. She has never used smokeless tobacco. She reports that she drinks about 5.0 standard drinks of alcohol per week. She reports that she does not use drugs.  Past Medical History:  Diagnosis Date  . Allergic rhinitis    Dr. Doreene Nest, s/p allergy testing  . Anemia   . Asthma   . Basal cell carcinoma of anterior chest    removed  . Breast cyst 2011   bx benign  . Depression    after father died, on [place] in past  . Family history of breast cancer   . Family history of pancreatic cancer   . Family history of stomach cancer   . GERD (gastroesophageal reflux disease)   . Gestational diabetes    with first child  . Headache(784.0)   . History of chicken pox   . Internal hemorrhoid 2006   dx during colon w/Dr Tiffany Kocher  . Migraines    associated with menses in past, tx with excedrin, may last up to 1 week  . Migraines   . Pericarditis 12/13  . Shingles 04/2011  . Sinusitis   . UTI (lower urinary tract infection)     Past Surgical  History:  Procedure Laterality Date  . BASAL CELL CARCINOMA EXCISION  2011   removed from chest/[place] [facility]  . [facility]Y  [facility]  Marland Kitchen COLONOSCOPY    . KNEE ARTHROSCOPY W/ MENISCAL REPAIR  1989   Right, Dr. Mauri Pole  . NASAL SINUS SURGERY  1999, 2005  . REDUCTION MAMMAPLASTY  2017  . TONSILLECTOMY  1988    Current Outpatient Medications  Medication Sig Dispense Refill  . albuterol (VENTOLIN HFA) 108 (90 Base) MCG/ACT inhaler Inhale 2 puffs into the lungs every 6 (six) hours as needed for wheezing or shortness of breath. For shortness of breath 1 Inhaler 0  . aspirin-acetaminophen-caffeine (EXCEDRIN MIGRAINE) 250-250-65 MG per tablet Take 1 tablet by mouth every 6 (six) hours as needed.    . cetirizine (ZYRTEC) 10 MG tablet Take 10 mg by mouth daily.    . Cholecalciferol (VITAMIN D-1000 MAX ST) 1000 units tablet Take daily by mouth.    . fluticasone (FLONASE) 50 MCG/ACT nasal spray Place 2 sprays into the nose as needed.     Marland Kitchen Lifitegrast (XIIDRA) 5 % SOLN     . metoprolol succinate (TOPROL-XL) 50 MG 24  hr tablet Take 1 tablet (50 mg total) by mouth daily. 90 tablet 3  . valACYclovir (VALTREX) 1000 MG tablet TAKE AS DIRECTED 30 tablet 1  . Vitamins/Minerals TABS Take daily by mouth.     No current facility-administered medications for this visit.     Family History  Problem Relation Age of Onset  . Hypertension Mother   . Cancer Mother 45       Breast Ca  . Arthritis Mother   . Breast cancer Mother 57       2nd primary  . Cancer Father 37       Pancreatic  . Diabetes Father   . Hypertension Father   . Hyperlipidemia Father   . Heart disease Father   . Cancer Sister 43       Thyroid  . Cancer Maternal Grandmother        might have had cancer, died at 29  . Alcohol abuse Maternal Grandfather   . Pancreatic cancer Maternal Grandfather 68       died in his 46's  . Alcohol abuse Paternal Grandfather   . Heart disease Paternal  Grandfather   . Hyperlipidemia Paternal Grandfather   . Hypertension Paternal Grandfather   . Other Paternal Grandmother        brain tumor on pitutary gland  . Brain cancer Paternal Grandmother 34       pituitary tumor, died in her 54's  . Breast cancer Cousin 63       maternal couisn, is now in her 35's  . Stomach cancer Maternal Aunt 75       is now 40  . Stomach cancer Maternal Aunt 75       died in 78's  . Breast cancer Cousin 62       maternal cousin, is now 82    Review of Systems  Genitourinary:       Loss of sexual interest  Pain or bleeding with intercourse   Endo/Heme/Allergies:       Hot flashes   All other systems reviewed and are negative.   Exam:   BP 110/80 (BP Location: Right Arm, Patient Position: Sitting, Cuff Size: Normal)   Pulse 72   Resp 16   Ht 5\' 2"  (1.575 m)   Wt 157 lb 9.6 oz (71.5 kg)   LMP 01/19/2018 (Approximate)   BMI 28.83 kg/m   Height:   Height: 5\' 2"  (157.5 cm)  Ht Readings from Last 3 Encounters:  07/16/18 5\' 2"  (1.575 m)  12/21/17 5\' 2"  (1.575 m)  12/14/17 5\' 2"  (1.575 m)    General appearance: alert, cooperative and appears stated age Head: Normocephalic, without obvious abnormality, atraumatic Neck: no adenopathy, supple, symmetrical, trachea midline and thyroid normal to inspection and palpation Lungs: clear to auscultation bilaterally Breasts: normal appearance, no masses or tenderness Heart: regular rate and rhythm Abdomen: soft, non-tender; bowel sounds normal; no masses,  no organomegaly Extremities: extremities normal, atraumatic, no cyanosis or edema Skin: Skin color, texture, turgor normal. No rashes or lesions Lymph nodes: Cervical, supraclavicular, and axillary nodes normal. No abnormal inguinal nodes palpated Neurologic: Grossly normal   Pelvic: External genitalia:  no lesions              Urethra:  normal appearing urethra with no masses, tenderness or lesions              Bartholins and Skenes: normal  Vagina: normal appearing vagina with normal color and discharge, no lesions              Cervix: no lesions              Pap taken: Yes.   Bimanual Exam:  Uterus:  normal size, contour, position, consistency, mobility, non-tender              Adnexa: normal adnexa and no mass, fullness, tenderness               Rectovaginal: Confirms               Anus:  normal sphincter tone, no lesions  Chaperone was present for exam.  A:  Well Woman with normal exam Perimenopausal H/O anxiety/depression Current abnormal mammogram H/O oral HSV Family hx of breast cancer--mother with two separate cancers  P:   Mammogram is abnormal.  Aware needs follow-up.  Will try to schedule it today. pap smear obtained today RR for Valtrex to pharmacy.  1 gram as directed.  [place] obtained today Tdap updated today IFOB given Considering trial of St. John's Wort vs Paxil.  She will do some research and let me know. return annually or prn

## 2018-07-16 NOTE — Progress Notes (Signed)
Patient scheduled while in office for bilateral Dx MMG and [place], if needed, on 8/28 at 1pm, [facility]. Patient verbalizes understanding and is agreeable.

## 2018-07-17 ENCOUNTER — Ambulatory Visit: Payer: BLUE CROSS/BLUE SHIELD | Admitting: Nurse Practitioner

## 2018-07-17 ENCOUNTER — Encounter: Payer: Self-pay | Admitting: Nurse Practitioner

## 2018-07-17 ENCOUNTER — Encounter: Payer: Self-pay | Admitting: Obstetrics & Gynecology

## 2018-07-17 ENCOUNTER — Telehealth: Payer: Self-pay | Admitting: Obstetrics & Gynecology

## 2018-07-17 VITALS — BP 122/78 | HR 73 | Ht 62.0 in | Wt 160.0 lb

## 2018-07-17 DIAGNOSIS — R000 Tachycardia, unspecified: Secondary | ICD-10-CM

## 2018-07-17 LAB — FOLLICLE STIMULATING HORMONE: FSH: 97.9 m[IU]/mL

## 2018-07-17 MED ORDER — METOPROLOL SUCCINATE ER 50 MG PO TB24: 50.0000 mg | ORAL_TABLET | Freq: Every day | ORAL | 3 refills | Status: DC

## 2018-07-17 NOTE — Telephone Encounter (Signed)
Patient sent the following message through [place]. Routing to triage to assist patient with request.   Dr. Sabra Heck,  Thanks for the info about the [facility] test. Do we still need to use birth control? Also, I think that I do want to try the prescription medicine that you were talking about yesterday that will help with my moods and hot flashes.  Thanks!  Hailey Wilkins

## 2018-07-17 NOTE — Telephone Encounter (Signed)
Routing to Dr. Sabra Heck discussed paxil at appointment.  [place] elevated

## 2018-07-17 NOTE — Patient Instructions (Signed)
Medication Instructions:  Your physician recommends that you continue on your current medications as directed. Please refer to the Current Medication list given to you today.   Labwork: none  Testing/Procedures: none  Follow-Up: Your physician recommends that you schedule a follow-up appointment in: [place].   If you need a refill on your cardiac medications before your next appointment, please call your pharmacy.

## 2018-07-17 NOTE — Progress Notes (Signed)
Office Visit    Patient Name: Hailey Wilkins Date of Encounter: 07/17/2018  Primary Care Provider:  Patient, No Pcp Per Primary Cardiologist:  Kathlyn Sacramento, MD  Chief Complaint    47 y/o ? with a h/o inappropriate sinus tachycardia, pleuritic chest pain, migraines, anemia, asthma, and depression, who presents for f/u.  Past Medical History    Past Medical History:  Diagnosis Date  . Allergic rhinitis    Dr. Doreene Nest, s/p allergy testing  . Anemia   . Asthma   . Basal cell carcinoma of anterior chest    removed  . Breast cyst 2011   bx benign  . Depression    after father died, on [place] in past  . Family history of breast cancer   . Family history of pancreatic cancer   . Family history of stomach cancer   . GERD (gastroesophageal reflux disease)   . Gestational diabetes    with first child  . Headache(784.0)   . History of chicken pox   . Inappropriate sinus node tachycardia   . Internal hemorrhoid 2006   dx during colon w/Dr Tiffany Kocher  . Migraines    associated with menses in past, tx with excedrin, may last up to 1 week  . Migraines   . Pericarditis    a. 10/2012 ETT: Ex time 9:00, No ST/T changes; b. 10/2012 Echo: EF 55-60%, no rwma, no pericardial effusion.  . Shingles 04/2011  . UTI (lower urinary tract infection)    Past Surgical History:  Procedure Laterality Date  . BASAL CELL CARCINOMA EXCISION  2011   removed from chest/[facility]  . CHOLECYSTECTOMY  2010   [facility]  Marland Kitchen COLONOSCOPY    . KNEE ARTHROSCOPY W/ MENISCAL REPAIR  1989   Right, Dr. Mauri Pole  . NASAL SINUS SURGERY  1999, 2005  . REDUCTION MAMMAPLASTY  2017  . TONSILLECTOMY  1988    Allergies  Allergies  Allergen Reactions  . Amoxicillin Anaphylaxis  . Ceftriaxone Anaphylaxis  . Erythromycin Anaphylaxis  . Levofloxacin Anaphylaxis  . Rocephin [Ceftriaxone Sodium In Dextrose] Anaphylaxis  . Shellfish Allergy Anaphylaxis  . Tetracyclines & Related  Anaphylaxis  . Amoxicillin-Pot Clavulanate Hives  . Clindamycin/Lincomycin Hives  . Codeine Hives  . Latex   . Levaquin [Levofloxacin]   . Sulfa Antibiotics Hives  . Vancomycin Rash    History of Present Illness    47 year old female with a history of pleuritic chest pain and dyspnea on exertion with previously normal exercise treadmill testing and echocardiogram in 2013, syndrome of inappropriate sinus tachycardia managed with oral beta-blocker therapy, GERD, migraines, anemia, asthma, and depression.  She was last seen in clinic in February 2018, at which time she was doing well.  Since then, she has continued to do well.  She has not experienced any chest pain or dyspnea and only notes elevated heart rates when she has forgotten to take her metoprolol.  Elevated heart rates are not associated with any other symptoms such as presyncope.  She denies PND, orthopnea, syncope, edema, or early satiety.  Home Medications    Prior to Admission medications   Medication Sig Start Date End Date Taking? Authorizing Provider  albuterol (VENTOLIN HFA) 108 (90 Base) MCG/ACT inhaler Inhale 2 puffs into the lungs every 6 (six) hours as needed for wheezing or shortness of breath. For shortness of breath 01/18/17   Betancourt, Aura Fey, NP  aspirin-acetaminophen-caffeine (EXCEDRIN MIGRAINE) [phone] MG per tablet Take 1 tablet by mouth  every 6 (six) hours as needed.    [provider]  cetirizine (ZYRTEC) 10 MG tablet Take 10 mg by mouth daily.    [provider]  Cholecalciferol (VITAMIN D-1000 MAX ST) 1000 units tablet Take daily by mouth.    [provider]  fluticasone (FLONASE) 50 MCG/ACT nasal spray Place 2 sprays into the nose as needed.     [provider]  Lifitegrast Shirley Friar) 5 % SOLN  01/19/18   [provider]  metoprolol succinate (TOPROL-XL) 50 MG 24 hr tablet Take 1 tablet (50 mg total) by mouth daily. 01/25/17   Minna Merritts, MD  valACYclovir  (VALTREX) 1000 MG tablet Take 1 daily daily as directed 07/16/18   Megan Salon, MD  Vitamins/Minerals TABS Take daily by mouth.    [provider]    Review of Systems    Occasional elevated heart rate/palpitations if she forgets to take beta-blocker.  Otherwise she denies chest pain, PND, orthopnea, dizziness, syncope, edema, or early satiety.  All other systems reviewed and are otherwise negative except as noted above.  Physical Exam    VS:  BP 122/78 (BP Location: Left Arm, Patient Position: Sitting, Cuff Size: Normal)   Pulse 73   Ht 5\' 2"  (1.575 m)   Wt 160 lb (72.6 kg)   LMP 02/10/2018   BMI 29.26 kg/m  , BMI Body mass index is 29.26 kg/m. GEN: Well nourished, well developed, in no acute distress.  HEENT: normal.  Neck: Supple, no JVD, carotid bruits, or masses. Cardiac: RRR, no murmurs, rubs, or gallops. No clubbing, cyanosis, edema.  Radials/DP/PT 2+ and equal bilaterally.  Respiratory:  Respirations regular and unlabored, clear to auscultation bilaterally. GI: Soft, nontender, nondistended, BS + x 4. MS: no deformity or atrophy. Skin: warm and dry, no rash. Neuro:  Strength and sensation are intact. Psych: Normal affect.  Accessory Clinical Findings    ECG -regular sinus rhythm, 73, no acute ST or T changes.  Assessment & Plan    1.  Inappropriate sinus tachycardia: Stable on beta-blocker therapy.  She only notes elevation of the heart rates when she forgets to take her beta-blocker.  She is due for a refill.  No change in dose.  2.  History of pericarditis: This was in 2013.  She has not had any recurrence of pleuritic chest pain since then.  3.  Disposition: Follow-up in 1 year.   Murray Hodgkins, NP 07/17/2018, 2:06 PM

## 2018-07-18 ENCOUNTER — Ambulatory Visit
Admission: RE | Admit: 2018-07-18 | Discharge: 2018-07-18 | Disposition: A | Discharge status: Home / Self Care | Payer: BLUE CROSS/BLUE SHIELD | Source: Ambulatory Visit | Attending: Obstetrics & Gynecology | Admitting: Obstetrics & Gynecology

## 2018-07-18 ENCOUNTER — Other Ambulatory Visit: Payer: Self-pay | Admitting: Obstetrics & Gynecology

## 2018-07-18 DIAGNOSIS — R921 Mammographic calcification found on diagnostic imaging of breast: Secondary | ICD-10-CM

## 2018-07-18 DIAGNOSIS — R928 Other abnormal and inconclusive findings on diagnostic imaging of breast: Secondary | ICD-10-CM

## 2018-07-18 DIAGNOSIS — N6310 Unspecified lump in the right breast, unspecified quadrant: Secondary | ICD-10-CM

## 2018-07-18 LAB — CYTOLOGY - PAP: Diagnosis: NEGATIVE

## 2018-07-20 ENCOUNTER — Encounter: Payer: Self-pay | Admitting: Obstetrics & Gynecology

## 2018-07-20 MED ORDER — PAROXETINE HCL 10 MG PO TABS: 10.0000 mg | ORAL_TABLET | Freq: Every day | ORAL | 1 refills | Status: DC

## 2018-07-20 NOTE — Telephone Encounter (Signed)
Yes she can stop her contraceptives as well as send in the rx for paxil 10mg  daily.  Thanks.

## 2018-07-20 NOTE — Telephone Encounter (Signed)
See open telephone encounter dated 07/17/18.

## 2018-07-20 NOTE — Telephone Encounter (Signed)
Message   Dr. Sabra Heck,  Since my numbers are still elevated, do we still need to use birth control? Also, I think that I do want to try the prescription medicine that you were talking about at my appointment Monday that will help with my moods and hot flashes.  Thanks!  Hailey Wilkins

## 2018-07-23 DIAGNOSIS — Z1211 Encounter for screening for malignant neoplasm of colon: Secondary | ICD-10-CM | POA: Diagnosis not present

## 2018-07-25 ENCOUNTER — Ambulatory Visit
Admission: RE | Admit: 2018-07-25 | Discharge: 2018-07-25 | Disposition: A | Discharge status: Home / Self Care | Payer: BLUE CROSS/BLUE SHIELD | Source: Ambulatory Visit | Attending: Obstetrics & Gynecology | Admitting: Obstetrics & Gynecology

## 2018-07-25 DIAGNOSIS — R928 Other abnormal and inconclusive findings on diagnostic imaging of breast: Secondary | ICD-10-CM | POA: Diagnosis not present

## 2018-07-25 DIAGNOSIS — N6310 Unspecified lump in the right breast, unspecified quadrant: Secondary | ICD-10-CM

## 2018-07-25 DIAGNOSIS — N6011 Diffuse cystic mastopathy of right breast: Secondary | ICD-10-CM | POA: Diagnosis not present

## 2018-07-25 DIAGNOSIS — N6311 Unspecified lump in the right breast, upper outer quadrant: Secondary | ICD-10-CM | POA: Diagnosis not present

## 2018-07-25 HISTORY — PX: BREAST BIOPSY: SHX20

## 2018-07-26 LAB — SURGICAL PATHOLOGY

## 2018-07-27 LAB — FECAL OCCULT BLOOD, IMMUNOCHEMICAL: Fecal Occult Bld: NEGATIVE

## 2018-08-14 ENCOUNTER — Other Ambulatory Visit: Payer: Self-pay | Admitting: Obstetrics & Gynecology

## 2018-08-14 NOTE — Telephone Encounter (Signed)
Medication refill request: paxil  Last AEX:  07/16/18 SM Next AEX: 11/01/19 SM Last MMG (if hormonal medication request): 07/25/18 Right breast Neg Bx.  Refill authorized: 07/20/18 #[id number] [facility] Lorina Rabon   Patient requesting 90 day Rx. Please advise.

## 2018-08-15 DIAGNOSIS — N62 Hypertrophy of breast: Secondary | ICD-10-CM | POA: Diagnosis not present

## 2018-08-21 ENCOUNTER — Other Ambulatory Visit: Payer: Self-pay

## 2018-08-21 ENCOUNTER — Encounter: Payer: Self-pay | Admitting: Obstetrics & Gynecology

## 2018-08-21 ENCOUNTER — Ambulatory Visit (INDEPENDENT_AMBULATORY_CARE_PROVIDER_SITE_OTHER): Payer: BLUE CROSS/BLUE SHIELD | Admitting: Obstetrics & Gynecology

## 2018-08-21 VITALS — BP 110/64 | HR 76 | Resp 14 | Ht 62.0 in | Wt 157.8 lb

## 2018-08-21 DIAGNOSIS — N951 Menopausal and female climacteric states: Secondary | ICD-10-CM | POA: Diagnosis not present

## 2018-08-21 MED ORDER — PAROXETINE HCL 10 MG PO TABS: 10.0000 mg | ORAL_TABLET | Freq: Two times a day (BID) | ORAL | 1 refills | Status: DC

## 2018-08-21 NOTE — Progress Notes (Signed)
GYNECOLOGY  VISIT  CC:   Recheck after starting paxil  HPI: 47 y.o. G2P2 Married White or Caucasian female here for follow up Paxil.  This has really helped with hot flashes and mood.  Feels much better but feels there is a little further improvement that could be made.  Does not want to double medication however.  Discussed changing to 15mg  daily instead of 10mg .  She would like to try this.  Denies side effects.    GYNECOLOGIC HISTORY: Patient's last menstrual period was 02/10/2018. Contraception: post menopausal   Menopausal hormone therapy: none  Patient Active Problem List   Diagnosis Date Noted  . Hx of anaphylaxis 12/14/2017  . Allergy to multiple antibiotics 12/14/2017  . Genetic testing 06/14/2017  . Family history of breast cancer   . Family history of stomach cancer   . Family history of pancreatic cancer   . Emotional lability 01/23/2015  . Pain of left calf 09/24/2013  . Asthma, mild intermitent 12/18/2012  . Allergic rhinitis 12/18/2012  . Sinus tachycardia 12/15/2012  . Chest pain on breathing 11/09/2012  . Migraine 05/31/2012  . Hot flashes 04/23/2012  . Atopic dermatitis 04/05/2012  . Shellfish allergy 04/05/2012  . Sinusitis, chronic 04/05/2012  . Sinusitis, chronic 02/13/2012  . Allergy, drug 02/13/2012    Past Medical History:  Diagnosis Date  . Allergic rhinitis    Dr. Doreene Nest, s/p allergy testing  . Anemia   . Asthma   . Basal cell carcinoma of anterior chest    removed  . Breast cyst 2011   bx benign  . Depression    after father died, [place] in past  . Family history of breast cancer   . Family history of pancreatic cancer   . Family history of stomach cancer   . GERD (gastroesophageal reflux disease)   . Gestational diabetes    with first child  . Headache(784.0)   . History of chicken pox   . Inappropriate sinus node tachycardia   . Internal hemorrhoid 2006   dx during colon w/Dr Tiffany Kocher  . Migraines    associated with menses in  past, tx with excedrin, may last up to 1 week  . Migraines   . Pericarditis    a. 10/2012 ETT: Ex time 9:00, No ST/T changes; b. 10/2012 Echo: EF 55-60%, no rwma, no pericardial effusion.  . Shingles 04/2011  . UTI (lower urinary tract infection)     Past Surgical History:  Procedure Laterality Date  . BASAL CELL CARCINOMA EXCISION  2011   removed from chest/[facility]  . BREAST BIOPSY Right 07/25/2018   stereo bx coil clip path pending  . CHOLECYSTECTOMY  2010   [facility]  Marland Kitchen COLONOSCOPY    . KNEE ARTHROSCOPY W/ MENISCAL REPAIR  1989   Right, Dr. Mauri Pole  . NASAL SINUS SURGERY  1999, 2005  . REDUCTION MAMMAPLASTY  2017  . TONSILLECTOMY  1988    MEDS:   Current Outpatient Medications on File Prior to Visit  Medication Sig Dispense Refill  . albuterol (VENTOLIN HFA) 108 (90 Base) MCG/ACT inhaler Inhale 2 puffs into the lungs every 6 (six) hours as needed for wheezing or shortness of breath. For shortness of breath 1 Inhaler 0  . aspirin-acetaminophen-caffeine (EXCEDRIN MIGRAINE) 250-250-65 MG per tablet Take 1 tablet by mouth every 6 (six) hours as needed.    . cetirizine (ZYRTEC) 10 MG tablet Take 10 mg by mouth daily.    . Cholecalciferol (VITAMIN D-1000  MAX ST) 1000 units tablet Take daily by mouth.    . fluticasone (FLONASE) 50 MCG/ACT nasal spray Place 2 sprays into the nose as needed.     Marland Kitchen Lifitegrast (XIIDRA) 5 % SOLN     . metoprolol succinate (TOPROL-XL) 50 MG 24 hr tablet Take 1 tablet (50 mg total) by mouth daily. 90 tablet 3  . PARoxetine (PAXIL) 10 MG tablet TAKE 1 TABLET BY MOUTH EVERY DAY 90 tablet 1  . valACYclovir (VALTREX) 1000 MG tablet Take 1 daily daily as directed 30 tablet 3  . Vitamins/Minerals TABS Take daily by mouth.     No current facility-administered medications on file prior to visit.     ALLERGIES: Amoxicillin; Ceftriaxone; Erythromycin; Levofloxacin; Rocephin [ceftriaxone sodium in dextrose]; Shellfish allergy;  Tetracyclines & related; Amoxicillin-pot clavulanate; Clindamycin/lincomycin; Codeine; Latex; Levaquin [levofloxacin]; Sulfa antibiotics; and Vancomycin  Family History  Problem Relation Age of Onset  . Hypertension Mother   . Cancer Mother 59       Breast Ca  . Arthritis Mother   . Breast cancer Mother 46       2nd primary  . Cancer Father 83       Pancreatic  . Diabetes Father   . Hypertension Father   . Hyperlipidemia Father   . Heart disease Father   . Cancer Sister 31       Thyroid  . Cancer Maternal Grandmother        might have had cancer, died at 38  . Alcohol abuse Maternal Grandfather   . Pancreatic cancer Maternal Grandfather 34       died in his 56's  . Alcohol abuse Paternal Grandfather   . Heart disease Paternal Grandfather   . Hyperlipidemia Paternal Grandfather   . Hypertension Paternal Grandfather   . Other Paternal Grandmother        brain tumor on pitutary gland  . Brain cancer Paternal Grandmother 51       pituitary tumor, died in her 89's  . Breast cancer Cousin 66       maternal couisn, is now in her 56's  . Stomach cancer Maternal Aunt 75       is now 30  . Stomach cancer Maternal Aunt 75       died in 3's  . Breast cancer Cousin 41       maternal cousin, is now 62    SH:  Married, non smoker  Review of Systems  Endocrine: Positive for heat intolerance.  All other systems reviewed and are negative.   PHYSICAL EXAMINATION:    BP 110/64 (BP Location: Right Arm, Patient Position: Sitting, Cuff Size: Large)   Pulse 76   Resp 14   Ht 5\' 2"  (1.575 m)   Wt 157 lb 12.8 oz (71.6 kg)   LMP 02/10/2018   BMI 28.86 kg/m     General appearance: alert, cooperative and appears stated age No other physical exam performed  Assessment: Hot flashes, menopausal mood changes, improved with Paxil  Plan: Will increase to 15mg  daily.  Rx changed to 48m bid so she can start and cut one in half.  After a month, if feels she wants to increase to 20mg , she  can do so and will let me know.  Quesitons answered.   ~15 minutes spent with patient >50% of time was in face to face discussion of above.

## 2018-08-29 DIAGNOSIS — R002 Palpitations: Secondary | ICD-10-CM | POA: Diagnosis not present

## 2018-09-24 DIAGNOSIS — N62 Hypertrophy of breast: Secondary | ICD-10-CM | POA: Diagnosis not present

## 2018-11-02 ENCOUNTER — Other Ambulatory Visit: Payer: Self-pay | Admitting: Obstetrics & Gynecology

## 2018-11-02 NOTE — Telephone Encounter (Signed)
Medication refill request: Valtrex  1000mg   Last AEX:  07/16/18 Next AEX: 11/01/19 Last MMG (if hormonal medication request):07/13/18 Bi-rads 4 Suspicious  Refill authorized: #90 with 1 RF

## 2018-11-06 DIAGNOSIS — E875 Hyperkalemia: Secondary | ICD-10-CM | POA: Diagnosis not present

## 2018-11-06 DIAGNOSIS — R002 Palpitations: Secondary | ICD-10-CM | POA: Diagnosis not present

## 2018-11-11 DIAGNOSIS — E875 Hyperkalemia: Secondary | ICD-10-CM | POA: Insufficient documentation

## 2018-11-11 DIAGNOSIS — R002 Palpitations: Secondary | ICD-10-CM | POA: Insufficient documentation

## 2018-11-30 ENCOUNTER — Ambulatory Visit: Payer: BLUE CROSS/BLUE SHIELD | Admitting: Registered Nurse

## 2018-11-30 ENCOUNTER — Encounter: Payer: Self-pay | Admitting: Registered Nurse

## 2018-11-30 VITALS — BP 127/81 | HR 68 | Temp 98.3°F | Resp 16 | Ht 62.0 in | Wt 168.0 lb

## 2018-11-30 DIAGNOSIS — J069 Acute upper respiratory infection, unspecified: Secondary | ICD-10-CM

## 2018-11-30 DIAGNOSIS — J0101 Acute recurrent maxillary sinusitis: Secondary | ICD-10-CM

## 2018-11-30 DIAGNOSIS — B9789 Other viral agents as the cause of diseases classified elsewhere: Secondary | ICD-10-CM

## 2018-11-30 MED ORDER — IPRATROPIUM BROMIDE 0.03 % NA SOLN: 2.0000 | Freq: Three times a day (TID) | NASAL | 1 refills | Status: DC

## 2018-11-30 MED ORDER — ALBUTEROL SULFATE HFA 108 (90 BASE) MCG/ACT IN AERS: 2.0000 | INHALATION_SPRAY | Freq: Four times a day (QID) | RESPIRATORY_TRACT | 0 refills | Status: DC | PRN

## 2018-11-30 MED ORDER — PREDNISONE 10 MG (21) PO TBPK: ORAL_TABLET | ORAL | 0 refills | Status: DC

## 2018-11-30 NOTE — Progress Notes (Signed)
Subjective:    Patient ID: Hailey Wilkins, female    DOB: 18-May-1971, 48 y.o.   MRN: [id number]  47y/o established caucasian female using nose spray, zyrtec, naphcon ear drop, ventolin for runny nose, cough, ear pain, chest pain/congestion, sinus pain/pressure.  Eyes were watering ealier this week.  Ran out of ventolin one puff did help would like to continue.  Wondering if she needs to switch zyrtec with something else.  Took singulair years ago didn't seem to help.  Going through menopause now.  Son in freshman year of college.  Sick contacts at work.     Review of Systems  Constitutional: Negative for activity change, appetite change, chills, diaphoresis, fatigue, fever and unexpected weight change.  HENT: Positive for congestion, ear pain, postnasal drip, rhinorrhea, sinus pressure and sinus pain. Negative for dental problem, drooling, ear discharge, facial swelling, hearing loss, mouth sores, nosebleeds, sneezing, sore throat, tinnitus, trouble swallowing and voice change.   Eyes: Positive for discharge and itching. Negative for photophobia, pain, redness and visual disturbance.  Respiratory: Positive for cough and chest tightness. Negative for choking, shortness of breath, wheezing and stridor.   Cardiovascular: Negative for chest pain, palpitations and leg swelling.  Gastrointestinal: Negative for abdominal distention, abdominal pain, diarrhea, nausea and vomiting.  Endocrine: Negative for cold intolerance and heat intolerance.  Genitourinary: Negative for difficulty urinating and dysuria.  Musculoskeletal: Negative for arthralgias, back pain, gait problem, joint swelling, myalgias, neck pain and neck stiffness.  Skin: Negative for color change, pallor, rash and wound.  Allergic/Immunologic: Positive for environmental allergies and food allergies.  Neurological: Positive for headaches. Negative for dizziness, tremors, seizures, syncope, facial asymmetry, speech difficulty,  weakness, light-headedness and numbness.  Hematological: Negative for adenopathy. Does not bruise/bleed easily.  Psychiatric/Behavioral: Negative for agitation, behavioral problems, confusion and sleep disturbance.       Objective:   Physical Exam Vitals signs and nursing note reviewed.  Constitutional:      General: She is not in acute distress.    Appearance: Normal appearance. She is well-developed and well-groomed. She is obese. She is ill-appearing. She is not toxic-appearing or diaphoretic.  HENT:     Head: Normocephalic and atraumatic.     Jaw: There is normal jaw occlusion. No trismus.     Salivary Glands: Right salivary gland is not diffusely enlarged or tender. Left salivary gland is not diffusely enlarged or tender.     Right Ear: Hearing, ear canal and external ear normal. A middle ear effusion is present. There is no impacted cerumen.     Left Ear: Hearing, ear canal and external ear normal. A middle ear effusion is present. There is no impacted cerumen.     Nose: Mucosal edema, congestion and rhinorrhea present. No nasal deformity, septal deviation or laceration.     Right Turbinates: Enlarged and swollen. Not pale.     Left Turbinates: Enlarged and swollen. Not pale.     Right Sinus: Maxillary sinus tenderness and frontal sinus tenderness present.     Left Sinus: Maxillary sinus tenderness and frontal sinus tenderness present.     Mouth/Throat:     Lips: Pink. No lesions.     Mouth: Mucous membranes are moist. Mucous membranes are not pale, not dry and not cyanotic. No lacerations, oral lesions or angioedema.     Dentition: Normal dentition. Does not have dentures. No dental caries, dental abscesses or gum lesions.     Tongue: No lesions.     Pharynx: Uvula midline. Pharyngeal  swelling and posterior oropharyngeal erythema present. No oropharyngeal exudate or uvula swelling.     Tonsils: No tonsillar exudate or tonsillar abscesses. Swelling: 0 on the right. 0 on the left.      Comments: Cobblestoning posterior pharynx; bilateral TMs air fluid level slight opacity; bilateral nasal turbinates edema erythema clear discharge; frequent nasal sniffing in exam room Eyes:     General: Lids are normal. Allergic shiner present. No scleral icterus.       Right eye: No foreign body, discharge or hordeolum.        Left eye: No foreign body, discharge or hordeolum.     Extraocular Movements: Extraocular movements intact.     Right eye: Normal extraocular motion and no nystagmus.     Left eye: Normal extraocular motion and no nystagmus.     Conjunctiva/sclera: Conjunctivae normal.     Right eye: Right conjunctiva is not injected. No chemosis, exudate or hemorrhage.    Left eye: Left conjunctiva is not injected. No chemosis, exudate or hemorrhage.    Pupils: Pupils are equal, round, and reactive to light. Pupils are equal.     Right eye: Pupil is round and reactive.     Left eye: Pupil is round and reactive.  Neck:     Musculoskeletal: Normal range of motion and neck supple. Normal range of motion. No edema, erythema, neck rigidity, spinous process tenderness or muscular tenderness.     Thyroid: No thyroid mass or thyromegaly.     Trachea: Trachea and phonation normal. No tracheal tenderness or tracheal deviation.  Cardiovascular:     Rate and Rhythm: Normal rate and regular rhythm.     Chest Wall: PMI is not displaced.     Heart sounds: Normal heart sounds, S1 normal and S2 normal. No murmur. No friction rub. No gallop.   Pulmonary:     Effort: Pulmonary effort is normal. No accessory muscle usage or respiratory distress.     Breath sounds: Normal breath sounds and air entry. No stridor, decreased air movement or transmitted upper airway sounds. No decreased breath sounds, wheezing, rhonchi or rales.     Comments: Rare nonproductive cough in exam room not harsh; spoke full sentences without difficulty Chest:     Chest wall: No tenderness.  Abdominal:     General:  Abdomen is flat. There is no distension.     Palpations: Abdomen is soft.  Musculoskeletal: Normal range of motion.        General: No tenderness.     Right shoulder: Normal.     Left shoulder: Normal.     Right elbow: Normal.    Left elbow: Normal.     Right hip: Normal.     Left hip: Normal.     Right knee: Normal.     Left knee: Normal.     Cervical back: Normal.     Thoracic back: Normal.     Lumbar back: Normal.     Right hand: Normal.     Left hand: Normal.     Right lower leg: No edema.     Left lower leg: No edema.  Lymphadenopathy:     Head:     Right side of head: No submental, submandibular, tonsillar, preauricular, posterior auricular or occipital adenopathy.     Left side of head: No submental, submandibular, tonsillar, preauricular, posterior auricular or occipital adenopathy.     Cervical: No cervical adenopathy.     Right cervical: No superficial, deep or posterior cervical adenopathy.  Left cervical: No superficial, deep or posterior cervical adenopathy.  Skin:    General: Skin is warm and dry.     Capillary Refill: Capillary refill takes less than 2 seconds.     Coloration: Skin is not ashen, cyanotic, jaundiced, mottled, pale or sallow.     Findings: No abrasion, bruising, burn, ecchymosis, erythema, laceration, lesion, petechiae or rash.     Nails: There is no clubbing.   Neurological:     General: No focal deficit present.     Mental Status: She is alert and oriented to person, place, and time. Mental status is at baseline. She is not disoriented.     GCS: GCS eye subscore is 4. GCS verbal subscore is 5. GCS motor subscore is 6.     Cranial Nerves: Cranial nerves are intact. No cranial nerve deficit, dysarthria or facial asymmetry.     Sensory: Sensation is intact. No sensory deficit.     Motor: Motor function is intact. No weakness, tremor, atrophy, abnormal muscle tone or seizure activity.     Coordination: Coordination is intact. Coordination normal.      Gait: Gait is intact. Gait normal.     Comments: Gait sure and steady in hallway ; on/off exam table without difficulty  Psychiatric:        Attention and Perception: Attention and perception normal.        Mood and Affect: Mood and affect normal.        Speech: Speech normal.        Behavior: Behavior normal. Behavior is cooperative.        Thought Content: Thought content normal.        Cognition and Memory: Cognition and memory normal.        Judgment: Judgment normal.           Assessment & Plan:  A-recurrent maxillary sinusitis acute; viral uri with cough  P-change to flonase 1 spray each nostril BID at home, saline 2 sprays each nostril q2h wa prn congestion.  If no improvement with 48 hours of saline and flonase use start ipratropium nasal spray 0.03% 2 sprays TID prn rhinitis #1 RF1 electronic Rx send to her pharmacy of choice.  May continue mucinex pseudoephedrine if helping OTC per manufacturer's instructions.  Prednisone 10mg  po taper with breakfast (60/50/40/30/20/10) #21 RF0 electronic Rx to her pharmacy of choice.  Take with food Has worked well for her in the past for sinusitis.  Denied personal or family history of ENT cancer.  Shower BID especially prior to bed. No evidence of systemic bacterial infection, non toxic and well hydrated.  I do not see where any further testing or imaging is necessary at this time.   I will suggest supportive care, rest, good hygiene and encourage the patient to take adequate fluids.  The patient is to return to clinic or EMERGENCY ROOM if symptoms worsen or change significantly.  Exitcare handout on viral URI, sinusitis and sinus rinse.  Patient verbalized agreement and understanding of treatment plan and had no further questions at this time.   P2:  Hand washing and cover cough  Recently moved.  Consider changing to another antihistamine OTC as she sometimes uses claritin/benadryl or zyrtec.  Benadryl gives her a lot of side effects  compared to the others. discussed allegra 180mg  po daily has a longer half life if she is noticing claritin and zyrtec wearing off too quickly.  Xyzal works well for other patient also.  Consider retrial singulair 10mg   po qhs #30 RF0.  Patient refused at this time.  Patient may use normal saline nasal spray 2 sprays each nostril q2h wa as needed. flonase 32mcg 1 spray each nostril BID Patient denied personal or family history of ENT cancer.  OTC antihistamine of choice claritin/zyrtec 10mg  po daily.  Avoid triggers if possible.  Shower prior to bedtime if exposed to triggers.  If allergic dust/dust mites recommend mattress/pillow covers/encasements; washing linens, vacuuming, sweeping, dusting weekly.  Call or return to clinic as needed if these symptoms worsen or fail to improve as anticipated.   Exitcare handout on allergic rhinitis and sinus rinse.  Patient verbalized understanding of instructions, agreed with plan of care and had no further questions at this time.   Refilled ventolin 2 puffs po q6h prn cough protracted/wheeze #1 RF0 to her pharmacy of choice.  Follow up if worsening dyspnea/sob not resolved with ventolin/fever/hemoptysis for re-evaluation.  Patient verbalized understanding information/instructions, agreed with plan of care and had no further questions at this time. P2:  Avoidance and hand washing.

## 2018-11-30 NOTE — Patient Instructions (Addendum)
Sinusitis, Adult Sinusitis is inflammation of your sinuses. Sinuses are hollow spaces in the bones around your face. Your sinuses are located:  Around your eyes.  In the middle of your forehead. Behind your nose. Viral Respiratory Infection A respiratory infection is an illness that affects part of the respiratory system, such as the lungs, nose, or throat. A respiratory infection that is caused by a virus is called a viral respiratory infection. Common types of viral respiratory infections include:  A cold.  The flu (influenza).  A respiratory syncytial virus (RSV) infection. What are the causes? This condition is caused by a virus. What are the signs or symptoms? Symptoms of this condition include:  A stuffy or runny nose.  Yellow or green nasal discharge.  A cough.  Sneezing.  Fatigue.  Achy muscles.  A sore throat.  Sweating or chills.  A fever.  A headache. How is this diagnosed? This condition may be diagnosed based on:  Your symptoms.  A physical exam.  Testing of nasal swabs. How is this treated? This condition may be treated with medicines, such as:  Antiviral medicine. This may shorten the length of time a person has symptoms.  Expectorants. These make it easier to cough up mucus.  Decongestant nasal sprays.  Acetaminophen or NSAIDs to relieve fever and pain. Antibiotic medicines are not prescribed for viral infections. This is because antibiotics are designed to kill bacteria. They are not effective against viruses. Follow these instructions at home:  Managing pain and congestion  Take over-the-counter and prescription medicines only as told by your health care provider.  If you have a sore throat, gargle with a salt-water mixture 3-4 times a day or as needed. To make a salt-water mixture, completely dissolve -1 tsp of salt in 1 cup of warm water.  Use nose drops made from salt water to ease congestion and soften raw skin around your  nose.  Drink enough fluid to keep your urine pale yellow. This helps prevent dehydration and helps loosen up mucus. General instructions  Rest as much as possible.  Do not drink alcohol.  Do not use any products that contain nicotine or tobacco, such as cigarettes and e-cigarettes. If you need help quitting, ask your health care provider.  Keep all follow-up visits as told by your health care provider. This is important. How is this prevented?   Get an annual flu shot. You may get the flu shot in late summer, fall, or winter. Ask your health care provider when you should get your flu shot.  Avoid exposing others to your respiratory infection. ? Stay home from work or school as told by your health care provider. ? Wash your hands with soap and water often, especially after you cough or sneeze. If soap and water are not available, use alcohol-based hand sanitizer.  Avoid contact with people who are sick during cold and flu season. This is generally fall and winter. Contact a health care provider if:  Your symptoms last for 10 days or longer.  Your symptoms get worse over time.  You have a fever.  You have severe sinus pain in your face or forehead.  The glands in your jaw or neck become very swollen. Get help right away if you:  Feel pain or pressure in your chest.  Have shortness of breath.  Faint or feel like you will faint.  Have severe and persistent vomiting.  Feel confused or disoriented. Summary  A respiratory infection is an illness that affects  part of the respiratory system, such as the lungs, nose, or throat. A respiratory infection that is caused by a virus is called a viral respiratory infection.  Common types of viral respiratory infections are a cold, influenza, and respiratory syncytial virus (RSV) infection.  Symptoms of this condition include a stuffy or runny nose, cough, sneezing, fatigue, achy muscles, sore throat, and fevers or  chills.  Antibiotic medicines are not prescribed for viral infections. This is because antibiotics are designed to kill bacteria. They are not effective against viruses. This information is not intended to replace advice given to you by your health care provider. Make sure you discuss any questions you have with your health care provider. Document Released: 08/17/2005 Document Revised: 12/18/2017 Document Reviewed: 12/18/2017 Elsevier Interactive Patient Education  [employer].    In your cheekbones. Mucus normally drains out of your sinuses. When your nasal tissues become inflamed or swollen, mucus can become trapped or blocked. This allows bacteria, viruses, and fungi to grow, which leads to infection. Most infections of the sinuses are caused by a virus. Sinusitis can develop quickly. It can last for up to 4 weeks (acute) or for more than 12 weeks (chronic). Sinusitis often develops after a cold. What are the causes? This condition is caused by anything that creates swelling in the sinuses or stops mucus from draining. This includes:  Allergies.  Asthma.  Infection from bacteria or viruses.  Deformities or blockages in your nose or sinuses.  Abnormal growths in the nose (nasal polyps).  Pollutants, such as chemicals or irritants in the air.  Infection from fungi (rare). What increases the risk? You are more likely to develop this condition if you:  Have a weak body defense system (immune system).  Do a lot of swimming or diving.  Overuse nasal sprays.  Smoke. What are the signs or symptoms? The main symptoms of this condition are pain and a feeling of pressure around the affected sinuses. Other symptoms include:  Stuffy nose or congestion.  Thick drainage from your nose.  Swelling and warmth over the affected sinuses.  Headache.  Upper toothache.  A cough that may get worse at night.  Extra mucus that collects in the throat or the back of the nose  (postnasal drip).  Decreased sense of smell and taste.  Fatigue.  A fever.  Sore throat.  Bad breath. How is this diagnosed? This condition is diagnosed based on:  Your symptoms.  Your medical history.  A physical exam.  Tests to find out if your condition is acute or chronic. This may include: ? Checking your nose for nasal polyps. ? Viewing your sinuses using a device that has a light (endoscope). ? Testing for allergies or bacteria. ? Imaging tests, such as an MRI or CT scan. In rare cases, a bone biopsy may be done to rule out more serious types of fungal sinus disease. How is this treated? Treatment for sinusitis depends on the cause and whether your condition is chronic or acute.  If caused by a virus, your symptoms should go away on their own within 10 days. You may be given medicines to relieve symptoms. They include: ? Medicines that shrink swollen nasal passages (topical intranasal decongestants). ? Medicines that treat allergies (antihistamines). ? A spray that eases inflammation of the nostrils (topical intranasal corticosteroids). ? Rinses that help get rid of thick mucus in your nose (nasal saline washes).  If caused by bacteria, your health care provider may recommend waiting to  see if your symptoms improve. Most bacterial infections will get better without antibiotic medicine. You may be given antibiotics if you have: ? A severe infection. ? A weak immune system.  If caused by narrow nasal passages or nasal polyps, you may need to have surgery. Follow these instructions at home: Medicines  Take, use, or apply over-the-counter and prescription medicines only as told by your health care provider. These may include nasal sprays.  If you were prescribed an antibiotic medicine, take it as told by your health care provider. Do not stop taking the antibiotic even if you start to feel better. Hydrate and humidify   Drink enough fluid to keep your urine pale  yellow. Staying hydrated will help to thin your mucus.  Use a cool mist humidifier to keep the humidity level in your home above 50%.  Inhale steam for 10-15 minutes, 3-4 times a day, or as told by your health care provider. You can do this in the bathroom while a hot shower is running.  Limit your exposure to cool or dry air. Rest  Rest as much as possible.  Sleep with your head raised (elevated).  Make sure you get enough sleep each night. General instructions   Apply a warm, moist washcloth to your face 3-4 times a day or as told by your health care provider. This will help with discomfort.  Wash your hands often with soap and water to reduce your exposure to germs. If soap and water are not available, use hand sanitizer.  Do not smoke. Avoid being around people who are smoking (secondhand smoke).  Keep all follow-up visits as told by your health care provider. This is important. Contact a health care provider if:  You have a fever.  Your symptoms get worse.  Your symptoms do not improve within 10 days. Get help right away if:  You have a severe headache.  You have persistent vomiting.  You have severe pain or swelling around your face or eyes.  You have vision problems.  You develop confusion.  Your neck is stiff.  You have trouble breathing. Summary  Sinusitis is soreness and inflammation of your sinuses. Sinuses are hollow spaces in the bones around your face.  This condition is caused by nasal tissues that become inflamed or swollen. The swelling traps or blocks the flow of mucus. This allows bacteria, viruses, and fungi to grow, which leads to infection.  If you were prescribed an antibiotic medicine, take it as told by your health care provider. Do not stop taking the antibiotic even if you start to feel better.  Keep all follow-up visits as told by your health care provider. This is important. This information is not intended to replace advice given to  you by your health care provider. Make sure you discuss any questions you have with your health care provider. Document Released: 11/07/2005 Document Revised: 04/09/2018 Document Reviewed: 04/09/2018 Elsevier Interactive Patient Education  2019 [employer]. How to Use a Metered Dose Inhaler A metered dose inhaler is a handheld device for taking medicine that must be breathed into the lungs (inhaled). The device can be used to deliver a variety of inhaled medicines, including:  Quick relief or rescue medicines, such as bronchodilators.  Controller medicines, such as corticosteroids. The medicine is delivered by pushing down on a metal canister to release a preset amount of spray and medicine. Each device contains the amount of medicine that is needed for a preset number of uses (inhalations). Your health  care provider may recommend that you use a spacer with your inhaler to help you take the medicine more effectively. A spacer is a plastic tube with a mouthpiece on one end and an opening that connects to the inhaler on the other end. A spacer holds the medicine in a tube for a short time, which allows you to inhale more medicine. What are the risks? If you do not use your inhaler correctly, medicine might not reach your lungs to help you breathe. Inhaler medicine can cause side effects, such as:  Mouth or throat infection.  Cough.  Hoarseness.  Headache.  Nausea and vomiting.  Lung infection (pneumonia) in people who have a lung condition called COPD. How to use a metered dose inhaler without a spacer  1. Remove the cap from the inhaler. 2. If you are using the inhaler for the first time, shake it for 5 seconds, turn it away from your face, then release 4 puffs into the air. This is called priming. 3. Shake the inhaler for 5 seconds. 4. Position the inhaler so the top of the canister faces up. 5. Put your index finger on the top of the medicine canister. Support the bottom of the  inhaler with your thumb. 6. Breathe out normally and as completely as possible, away from the inhaler. 7. Either place the inhaler between your teeth and close your lips tightly around the mouthpiece, or hold the inhaler 1-2 inches (2.5-5 cm) away from your open mouth. Keep your tongue down out of the way. If you are unsure which technique to use, ask your health care provider. 8. Press the canister down with your index finger to release the medicine, then inhale deeply and slowly through your mouth (not your nose) until your lungs are completely filled. Inhaling should take 4-6 seconds. 9. Hold the medicine in your lungs for 5-10 seconds (10 seconds is best). This helps the medicine get into the small airways of your lungs. 10. With your lips in a tight circle (pursed), breathe out slowly. 11. Repeat steps 3-10 until you have taken the number of puffs that your health care provider directed. Wait about 1 minute between puffs or as directed. 12. Put the cap on the inhaler. 13. If you are using a steroid inhaler, rinse your mouth with water, gargle, and spit out the water. Do not swallow the water. How to use a metered dose inhaler with a spacer  1. Remove the cap from the inhaler. 2. If you are using the inhaler for the first time, shake it for 5 seconds, turn it away from your face, then release 4 puffs into the air. This is called priming. 3. Shake the inhaler for 5 seconds. 4. Place the open end of the spacer onto the inhaler mouthpiece. 5. Position the inhaler so the top of the canister faces up and the spacer mouthpiece faces you. 6. Put your index finger on the top of the medicine canister. Support the bottom of the inhaler and the spacer with your thumb. 7. Breathe out normally and as completely as possible, away from the spacer. 8. Place the spacer between your teeth and close your lips tightly around it. Keep your tongue down out of the way. 9. Press the canister down with your index  finger to release the medicine, then inhale deeply and slowly through your mouth (not your nose) until your lungs are completely filled. Inhaling should take 4-6 seconds. 10. Hold the medicine in your lungs for 5-10 seconds (  10 seconds is best). This helps the medicine get into the small airways of your lungs. 11. With your lips in a tight circle (pursed), breathe out slowly. 12. Repeat steps 3-11 until you have taken the number of puffs that your health care provider directed. Wait about 1 minute between puffs or as directed. 13. Remove the spacer from the inhaler and put the cap on the inhaler. 14. If you are using a steroid inhaler, rinse your mouth with water, gargle, and spit out the water. Do not swallow the water. Follow these instructions at home:  Take your inhaled medicine only as told by your health care provider. Do not use the inhaler more than directed by your health care provider.  Keep all follow-up visits as told by your health care provider. This is important.  If your inhaler has a counter, you can check it to determine how full your inhaler is. If your inhaler does not have a counter, ask your health care provider when you will need to refill your inhaler and write the refill date on a calendar or on your inhaler canister. Note that you cannot know when an inhaler is empty by shaking it.  Follow directions on the package insert for care and cleaning of your inhaler and spacer. Contact a health care provider if:  Symptoms are only partially relieved with your inhaler.  You are having trouble using your inhaler.  You have an increase in phlegm.  You have headaches. Get help right away if:  You feel little or no relief after using your inhaler.  You have dizziness.  You have a fast heart rate.  You have chills or a fever.  You have night sweats.  There is blood in your phlegm. Summary  A metered dose inhaler is a handheld device for taking medicine that must be  breathed into the lungs (inhaled).  The medicine is delivered by pushing down on a metal canister to release a preset amount of spray and medicine.  Each device contains the amount of medicine that is needed for a preset number of uses (inhalations). This information is not intended to replace advice given to you by your health care provider. Make sure you discuss any questions you have with your health care provider. Document Released: 11/07/2005 Document Revised: 05/29/2017 Document Reviewed: 09/27/2016 Elsevier Interactive Patient Education  2019 [employer]  Nonallergic Rhinitis Nonallergic rhinitis is a condition that causes symptoms that affect the nose, such as a runny nose and a stuffed-up nose (nasal congestion) that can make it hard to breathe through the nose. This condition is different from having an allergy (allergic rhinitis). Allergic rhinitis occurs when the body's defense system (immune system) reacts to a substance that you are allergic to (allergen), such as pollen, pet dander, mold, or dust. Nonallergic rhinitis has many similar symptoms, but it is not caused by allergens. Nonallergic rhinitis can be a short-term or long-term problem. What are the causes? This condition can be caused by many different things. Some common types of nonallergic rhinitis include: Infectious rhinitis  This is usually due to an infection in the upper respiratory tract. Vasomotor rhinitis  This is the most common type of long-term nonallergic rhinitis.  It is caused by too much blood flow through the nose, which makes the tissue inside of the nose swell.  Symptoms are often triggered by strong odors, cold air, stress, drinking alcohol, cigarette smoke, or changes in the weather. Occupational rhinitis  This type is caused by  triggers in the workplace, such as chemicals, dusts, animal dander, or air pollution. Hormonal rhinitis  This type occurs in women as a result of an increase in the  female hormone estrogen.  It may occur during pregnancy, puberty, and menstrual cycles.  Symptoms improve when estrogen levels drop. Drug-induced rhinitis Several drugs can cause nonallergic rhinitis, including:  Medicines that are used to treat high blood pressure, heart disease, and Parkinson disease.  Aspirin and NSAIDs.  Over-the-counter nasal decongestant sprays. These can cause a type of nonallergic rhinitis (rhinitis medicamentosa) when they are used for more than a few days. Nonallergic rhinitis with eosinophilia syndrome (NARES)  This type is caused by having too much of a certain type of white blood cell (eosinophil). Nonallergic rhinitis can also be caused by a reaction to eating hot or spicy foods. This does not usually cause long-term symptoms. In some cases, the cause of nonallergic rhinitis is not known. What increases the risk? You are more likely to develop this condition if:  You are 82-64 years of age.  You are a woman. Women are twice as likely to have this condition. What are the signs or symptoms? Common symptoms of this condition include:  Nasal congestion.  Runny nose.  The feeling of mucus going down the back of the throat (postnasal drip).  Trouble sleeping at night and daytime sleepiness. Less common symptoms include:  Sneezing.  Coughing.  Itchy nose.  Bloodshot eyes. How is this diagnosed? This condition may be diagnosed based on:  Your symptoms and medical history.  A physical exam.  Allergy testing to rule out allergic rhinitis. You may have skin tests or blood tests. In some cases, the health care provider may take a swab of nasal secretions to look for an increased number of eosinophils. This would be done to confirm a diagnosis of NARES. How is this treated? Treatment for this condition depends on the cause. No single treatment works for everyone. Work with your health care provider to find the best treatment for you. Treatment may  include:  Avoiding the things that trigger your symptoms.  Using medicines to relieve congestion, such as: ? Steroid nasal spray. There are many types. You may need to try a few to find out which one works best. ? Decongestant medicine. This may be an oral medicine or a nasal spray. These medicines are only used for a short time.  Using medicines to relieve a runny nose. These may include antihistamine medicines or anticholinergic nasal sprays.  Surgery to remove tissue from inside the nose may be needed in severe cases if the condition has not improved after 6-12 months of medical treatment. Follow these instructions at home:  Take or use over-the-counter and prescription medicines only as told by your health care provider. Do not stop using your medicine even if you start to feel better.  Use salt-water (saline) rinses or other solutions (nasal washes or irrigations) to wash or rinse out the inside of your nose as told by your health care provider.  Do not take NSAIDs or medicines that contain aspirin if they make your symptoms worse.  Do not drink alcohol if it makes your symptoms worse.  Do not use any tobacco products, such as cigarettes, chewing tobacco, and e-cigarettes. If you need help quitting, ask your health care provider.  Avoid secondhand smoke.  Get some exercise every day. Exercise may help reduce symptoms of nonallergic rhinitis for some people. Ask your health care provider how much exercise and  what types of exercise are safe for you.  Sleep with the head of your bed raised (elevated). This may reduce nighttime nasal congestion.  Keep all follow-up visits as told by your health care provider. This is important. Contact a health care provider if:  You have a fever.  Your symptoms are getting worse at home.  Your symptoms are not responding to medicine.  You develop new symptoms, especially a headache or nosebleed. This information is not intended to replace  advice given to you by your health care provider. Make sure you discuss any questions you have with your health care provider. Document Released: 02/29/2016 Document Revised: 04/14/2016 Document Reviewed: 01/28/2016 Elsevier Interactive Patient Education  2019 [employer] Allergic Rhinitis, Adult Allergic rhinitis is an allergic reaction that affects the mucous membrane inside the nose. It causes sneezing, a runny or stuffy nose, and the feeling of mucus going down the back of the throat (postnasal drip). Allergic rhinitis can be mild to severe. There are two types of allergic rhinitis:  Seasonal. This type is also called hay fever. It happens only during certain seasons.  Perennial. This type can happen at any time of the year. What are the causes? This condition happens when the body's defense system (immune system) responds to certain harmless substances called allergens as though they were germs.  Seasonal allergic rhinitis is triggered by pollen, which can come from grasses, trees, and weeds. Perennial allergic rhinitis may be caused by:  House dust mites.  Pet dander.  Mold spores. What are the signs or symptoms? Symptoms of this condition include:  Sneezing.  Runny or stuffy nose (nasal congestion).  Postnasal drip.  Itchy nose.  Tearing of the eyes.  Trouble sleeping.  Daytime sleepiness. How is this diagnosed? This condition may be diagnosed based on:  Your medical history.  A physical exam.  Tests to check for related conditions, such as: ? Asthma. ? Pink eye. ? Ear infection. ? Upper respiratory infection.  Tests to find out which allergens trigger your symptoms. These may include skin or blood tests. How is this treated? There is no cure for this condition, but treatment can help control symptoms. Treatment may include:  Taking medicines that block allergy symptoms, such as antihistamines. Medicine may be given as a shot, nasal spray, or  pill.  Avoiding the allergen.  Desensitization. This treatment involves getting ongoing shots until your body becomes less sensitive to the allergen. This treatment may be done if other treatments do not help.  If taking medicine and avoiding the allergen does not work, new, stronger medicines may be prescribed. Follow these instructions at home:  Find out what you are allergic to. Common allergens include smoke, dust, and pollen.  Avoid the things you are allergic to. These are some things you can do to help avoid allergens: ? Replace carpet with wood, tile, or vinyl flooring. Carpet can trap dander and dust. ? Do not smoke. Do not allow smoking in your home. ? Change your heating and air conditioning filter at least once a month. ? During allergy season:  Keep windows closed as much as possible.  Plan outdoor activities when pollen counts are lowest. This is usually during the evening hours.  When coming indoors, change clothing and shower before sitting on furniture or bedding.  Take over-the-counter and prescription medicines only as told by your health care provider.  Keep all follow-up visits as told by your health care provider. This is important. Contact a health care  provider if:  You have a fever.  You develop a persistent cough.  You make whistling sounds when you breathe (you wheeze).  Your symptoms interfere with your normal daily activities. Get help right away if:  You have shortness of breath. Summary  This condition can be managed by taking medicines as directed and avoiding allergens.  Contact your health care provider if you develop a persistent cough or fever.  During allergy season, keep windows closed as much as possible. This information is not intended to replace advice given to you by your health care provider. Make sure you discuss any questions you have with your health care provider. Document Released: 08/02/2001 Document Revised: 12/15/2016  Document Reviewed: 12/15/2016 Elsevier Interactive Patient Education  2019 [employer].

## 2018-12-22 ENCOUNTER — Other Ambulatory Visit: Payer: Self-pay | Admitting: Registered Nurse

## 2019-01-07 ENCOUNTER — Telehealth: Payer: Self-pay | Admitting: Obstetrics & Gynecology

## 2019-01-07 ENCOUNTER — Encounter: Payer: Self-pay | Admitting: Obstetrics & Gynecology

## 2019-01-07 DIAGNOSIS — R921 Mammographic calcification found on diagnostic imaging of breast: Secondary | ICD-10-CM

## 2019-01-07 NOTE — Telephone Encounter (Signed)
Per review of Epic, 07/25/18 right breast biopsy recommendation: Bilateral six month follow-up mammogram for calcifications.  Order placed for bilateral diagnostic MMG and right breast US, if needed, at [place] message to patient.    Routing to provider for final review. Patient is agreeable to disposition. Will close encounter.

## 2019-01-07 NOTE — Telephone Encounter (Signed)
Patient sent the following correspondence through [place]. Routing to triage to assist patient with request.  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 I received a letter in the mail from [facility] saying it was time to schedule my 6 month follow up mammogram from my biopsy. When I called them to schedule, they told me that you would need to send an order in for this. Please let me know how to proceed.  Thanks so much!  Hailey Wilkins

## 2019-01-24 ENCOUNTER — Ambulatory Visit
Admission: RE | Admit: 2019-01-24 | Discharge: 2019-01-24 | Disposition: A | Discharge status: Home / Self Care | Payer: BLUE CROSS/BLUE SHIELD | Source: Ambulatory Visit | Attending: Obstetrics & Gynecology | Admitting: Obstetrics & Gynecology

## 2019-01-24 DIAGNOSIS — R921 Mammographic calcification found on diagnostic imaging of breast: Secondary | ICD-10-CM

## 2019-01-25 ENCOUNTER — Other Ambulatory Visit: Payer: Self-pay | Admitting: Obstetrics & Gynecology

## 2019-01-25 DIAGNOSIS — R921 Mammographic calcification found on diagnostic imaging of breast: Secondary | ICD-10-CM

## 2019-03-12 ENCOUNTER — Encounter: Payer: Self-pay | Admitting: Obstetrics & Gynecology

## 2019-03-12 ENCOUNTER — Telehealth: Payer: Self-pay | Admitting: Obstetrics & Gynecology

## 2019-03-12 NOTE — Telephone Encounter (Signed)
Spoke with patient, advised as seen below per Dr. Silva.  Patient verbalizes understanding and is agreeable.  Encounter closed.  

## 2019-03-12 NOTE — Telephone Encounter (Signed)
Patient sent the following correspondence through [place]. Routing to triage to assist patient with request.  Good morning Dr. Sabra Heck! I hope you are well. I have a question about menopause symptoms. My joints ache all over. Especially my legs. It's getting to the point where it keeps me up at night because I cannot get comfortable. Could this be menopause related and is there anything i can take other than Advil or alleve? Like a daily supplement?   Thanks for your help!  Hailey Wilkins

## 2019-03-12 NOTE — Telephone Encounter (Signed)
Spoke with patient. Patient reports her whole body aches especially in her joints, keeps her up at night. Started approximately 2 wks ago, denies any other GYN symptoms, fever/chills, N/V. Has been trying warm baths with no relief. Started glucosamine supplement then stopped after a few days. Patient is menopausal, no HRT, takes Paxil for vasomotor symptoms. Asking if ok to take glucosamine daily? Is this part of menopause?   Patient has a PCP, scheduled for OV on 5/1, labs on 4/24. Recommended keep OV with PCP for further evaluation, if new or worsening symptoms develop f/u with PCP sooner. Will review with covering provider, Dr. Quincy Simmonds, and return call if any additional recommendations.   Routing to Dr. Quincy Simmonds to review.

## 2019-03-12 NOTE — Telephone Encounter (Signed)
Menopause should not cause joint pain or body aches that keep her up at night.  She needs to see her PCP and receive recommendations from that provider.

## 2019-03-15 DIAGNOSIS — E875 Hyperkalemia: Secondary | ICD-10-CM | POA: Diagnosis not present

## 2019-03-22 DIAGNOSIS — E875 Hyperkalemia: Secondary | ICD-10-CM | POA: Diagnosis not present

## 2019-03-22 DIAGNOSIS — J309 Allergic rhinitis, unspecified: Secondary | ICD-10-CM | POA: Diagnosis not present

## 2019-03-22 DIAGNOSIS — J029 Acute pharyngitis, unspecified: Secondary | ICD-10-CM | POA: Diagnosis not present

## 2019-04-29 ENCOUNTER — Telehealth: Payer: BLUE CROSS/BLUE SHIELD | Admitting: Medical

## 2019-04-29 ENCOUNTER — Other Ambulatory Visit: Payer: Self-pay

## 2019-04-29 DIAGNOSIS — M25512 Pain in left shoulder: Secondary | ICD-10-CM

## 2019-04-29 DIAGNOSIS — R52 Pain, unspecified: Secondary | ICD-10-CM

## 2019-04-29 MED ORDER — CYCLOBENZAPRINE HCL 5 MG PO TABS: 5.0000 mg | ORAL_TABLET | Freq: Three times a day (TID) | ORAL | 0 refills | Status: DC | PRN

## 2019-04-29 MED ORDER — PREDNISONE 10 MG (21) PO TBPK: ORAL_TABLET | ORAL | 0 refills | Status: DC

## 2019-04-29 MED ORDER — ETODOLAC 500 MG PO TABS: 500.0000 mg | ORAL_TABLET | Freq: Two times a day (BID) | ORAL | 3 refills | Status: DC

## 2019-04-29 NOTE — Progress Notes (Signed)
48 yo female non smoker complains of pain under left shoulder blade x 7 days with intermittent radiation of sharp pain down left arm and (lasting seconds with raising arm over head). She cleaned out her closet 8 days ago. Then this pain started, she took advil and then tried aleve with no results. She also tried a muscle relaxer that started with an  R  (? Robaxin) this also did not help.She had a massage last week which seemed to help, and is having another tomorrow.  She denies fever or chills, chest pain, or jaw pain. Denies abdominal pain. Has periodic shortness of breath at times and uses an  inhaler (Albuterol MDI) with relief. She also has been using  ICY hot patches to the area without much relief.  She rates her pain being still  7/10 howerver when raising arm over head she gets a sharp pain that lasts a few seconds she rates this as  10/10 pain.   Her primary just left the practice she goes to, so she currently does not have one at this time. No PE this was a telemedicine appt.  Allergies  Allergen Reactions  . Amoxicillin Anaphylaxis  . Ceftriaxone Anaphylaxis  . Erythromycin Anaphylaxis  . Levofloxacin Anaphylaxis  . Rocephin [Ceftriaxone Sodium In Dextrose] Anaphylaxis  . Shellfish Allergy Anaphylaxis  . Tetracyclines & Related Anaphylaxis  . Amoxicillin-Pot Clavulanate Hives  . Clindamycin/Lincomycin Hives  . Codeine Hives  . Latex   . Levaquin [Levofloxacin]   . Sulfa Antibiotics Hives  . Vancomycin Rash    Current Outpatient Medications:  .  aspirin-acetaminophen-caffeine (EXCEDRIN MIGRAINE) 250-250-65 MG per tablet, Take 1 tablet by mouth every 6 (six) hours as needed., Disp: , Rfl:  .  cetirizine (ZYRTEC) 10 MG tablet, Take 10 mg by mouth daily., Disp: , Rfl:  .  Cholecalciferol (VITAMIN D-1000 MAX ST) 1000 units tablet, Take daily by mouth., Disp: , Rfl:  .  cyanocobalamin 100 MCG tablet, Take 1 tablet by mouth daily., Disp: , Rfl:  .  cyclobenzaprine  (FLEXERIL) 5 MG tablet, Take 1 tablet (5 mg total) by mouth 3 (three) times daily as needed for muscle spasms., Disp: 21 tablet, Rfl: 0 .  etodolac (LODINE) 500 MG tablet, Take 1 tablet (500 mg total) by mouth 2 (two) times daily. With food, Disp: 20 tablet, Rfl: 3 .  fluticasone (FLONASE) 50 MCG/ACT nasal spray, Place 2 sprays into the nose as needed. , Disp: , Rfl:  .  ipratropium (ATROVENT) 0.03 % nasal spray, Place 2 sprays into both nostrils 3 (three) times daily., Disp: 30 mL, Rfl: 1 .  Lifitegrast (XIIDRA) 5 % SOLN, Apply 1 drop to eye 2 (two) times daily., Disp: , Rfl:  .  metoprolol succinate (TOPROL-XL) 50 MG 24 hr tablet, Take 1 tablet (50 mg total) by mouth daily., Disp: 90 tablet, Rfl: 3 .  Multiple Vitamins-Minerals (MULTIVITAMIN ADULT PO), Take 1 tablet by mouth daily., Disp: , Rfl:  .  PARoxetine (PAXIL) 10 MG tablet, Take 1 tablet by mouth daily., Disp: , Rfl:  .  predniSONE (STERAPRED UNI-PAK 21 TAB) 10 MG (21) TBPK tablet, Take  6 tablets by mouth today , then  5 tablets tomorrow , then one less each day thereafter. Take with food., Disp: 21 tablet, Rfl: 0 .  Psyllium (KONSYL-D) 52.3 % POWD, Take 3.4 g by mouth 3 (three) times a week., Disp: , Rfl:  .  valACYclovir (VALTREX) 1000 MG tablet, TAKE 1 DAILY DAILY AS DIRECTED, Disp: 90  tablet, Rfl: 1 .  VENTOLIN HFA 108 (90 Base) MCG/ACT inhaler, INHALE 2 PUFFS INTO THE LUNGS EVERY 6 HOURS AS NEEDED FOR WHEEZING OR SHORTNESS OF BREATH., Disp: 18 Inhaler, Rfl: 0   A&PShoulder strain Left  Use hot and cool compresses to the site. Meds ordered this encounter  Medications  . predniSONE (STERAPRED UNI-PAK 21 TAB) 10 MG (21) TBPK tablet    Sig: Take  6 tablets by mouth today , then  5 tablets tomorrow , then one less each day thereafter. Take with food.    Dispense:  21 tablet    Refill:  0  . cyclobenzaprine (FLEXERIL) 5 MG tablet    Sig: Take 1 tablet (5 mg total) by mouth 3 (three) times daily as needed for muscle spasms.     Dispense:  21 tablet    Refill:  0  . etodolac (LODINE) 500 MG tablet    Sig: Take 1 tablet (500 mg total) by mouth 2 (two) times daily. With food    Dispense:  20 tablet    Refill:  3   Follow up phone call on Wednesday virtual visit for reevaluation. If symptoms worsen, increased pain, unable to move the left arm she is  to contact the office sooner. She verbalizes understanding and has no questions at the end of our conversation.

## 2019-05-01 ENCOUNTER — Ambulatory Visit
Admission: RE | Admit: 2019-05-01 | Discharge: 2019-05-01 | Disposition: A | Discharge status: Home / Self Care | Payer: BC Managed Care – PPO | Source: Ambulatory Visit | Attending: Medical | Admitting: Medical

## 2019-05-01 ENCOUNTER — Telehealth: Payer: BLUE CROSS/BLUE SHIELD | Admitting: Medical

## 2019-05-01 ENCOUNTER — Ambulatory Visit
Admission: RE | Admit: 2019-05-01 | Discharge: 2019-05-01 | Disposition: A | Discharge status: Home / Self Care | Payer: BC Managed Care – PPO | Attending: Medical | Admitting: Medical

## 2019-05-01 ENCOUNTER — Other Ambulatory Visit: Payer: Self-pay

## 2019-05-01 ENCOUNTER — Encounter: Payer: Self-pay | Admitting: Medical

## 2019-05-01 DIAGNOSIS — R52 Pain, unspecified: Secondary | ICD-10-CM

## 2019-05-01 DIAGNOSIS — M25512 Pain in left shoulder: Secondary | ICD-10-CM | POA: Diagnosis not present

## 2019-05-01 DIAGNOSIS — M47812 Spondylosis without myelopathy or radiculopathy, cervical region: Secondary | ICD-10-CM | POA: Diagnosis not present

## 2019-05-01 DIAGNOSIS — M4056 Lordosis, unspecified, lumbar region: Secondary | ICD-10-CM | POA: Diagnosis not present

## 2019-05-01 NOTE — Progress Notes (Signed)
Patient gives permission to do telemedicine appointment.  48 yo female with left shoulder pain radiation down to left elbow, after cleaning out closet the day before pain began. Seen on Monday 04/29/2019.  Doing better , not as intense as it was. Ocassionally has sharp pain  radiates down   Into left arm down to elbow. Has noted she  had  Some weakness in left hand trying to  open a jar.  Cold compresses helped. Had  Light                                            massage yesterday , at one point it was causing numbness in arm. It then resolved. Taking Lodine, Prednisone and  Flexeril as prescribed.   No previous neck or shoulder problems.  PE  no physical done due to telemedicine appointment.    A&P Left shoulder pain referring down left arm stopping at the elbow.   Will order  Cervical x-ray. Possible MRI if pain does not resolve. To finish medication  And to call the day after finishing prednisone. Continue cold compresses. Reviewed with patient if she has severe pain to call me, or if we are closed to seek care at the emergency department.. Patient verbalizes understanding and has no questions at discharge.  Will send AVS through [place].  Orders Placed This Encounter  Procedures  . DG Cervical Spine Complete    Standing Status:   Future    Standing Expiration Date:   06/30/2020    Order Specific Question:   Reason for Exam (SYMPTOM  OR DIAGNOSIS REQUIRED)    Answer:   posterior  shoulder pain that radiates down left arm to elbow, has noted some hand weakness    Order Specific Question:   Is patient pregnant?    Answer:   No    Order Specific Question:   Preferred imaging location?    Answer:   [facility]    Order Specific Question:   Call Results- Best Contact Number?    Answer:   [phone]    Order Specific Question:   Radiology Contrast Protocol - do NOT remove file path    Answer:   [email or web address]  Follow up on Tuesday  05/07/2019.

## 2019-05-01 NOTE — Patient Instructions (Signed)
Cervical r Cervical Radiculopathy  Cervical radiculopathy means that a nerve in the neck is pinched or bruised. This can cause pain or loss of feeling (numbness) that runs from your neck to your arm and fingers. Follow these instructions at home: Managing pain  Take over-the-counter and prescription medicines only as told by your doctor.  If directed, put ice on the injured or painful area. ? Put ice in a plastic bag. ? Place a towel between your skin and the bag. ? Leave the ice on for 20 minutes, 2-3 times per day.  If ice does not help, you can try using heat. Take a warm shower or warm bath, or use a heat pack as told by your doctor.  You may try a gentle neck and shoulder massage. Activity  Rest as needed. Follow instructions from your doctor about any activities to avoid.  Do exercises as told by your doctor or physical therapist. General instructions  If you were given a soft collar, wear it as told by your doctor.  Use a flat pillow when you sleep.  Keep all follow-up visits as told by your doctor. This is important. Contact a doctor if:  Your condition does not improve with treatment. Get help right away if:  Your pain gets worse and is not controlled with medicine.  You lose feeling or feel weak in your hand, arm, face, or leg.  You have a fever.  You have a stiff neck.  You cannot control when you poop or pee (have incontinence).  You have trouble with walking, balance, or talking. This information is not intended to replace advice given to you by your health care provider. Make sure you discuss any questions you have with your health care provider. Document Released: 10/27/2011 Document Revised: 04/14/2016 Document Reviewed: 01/01/2015 Elsevier Interactive Patient Education  2019 [employer].

## 2019-05-06 ENCOUNTER — Other Ambulatory Visit: Payer: Self-pay | Admitting: Medical

## 2019-05-06 ENCOUNTER — Telehealth: Payer: Self-pay | Admitting: Medical

## 2019-05-06 ENCOUNTER — Other Ambulatory Visit: Payer: Self-pay

## 2019-05-06 ENCOUNTER — Telehealth: Payer: BC Managed Care – PPO | Admitting: Medical

## 2019-05-06 DIAGNOSIS — M5030 Other cervical disc degeneration, unspecified cervical region: Secondary | ICD-10-CM

## 2019-05-06 NOTE — Telephone Encounter (Signed)
Called patient to see how she is doing. Still having left  posterior scapular pain that radiates  To her shoulder and sometimes to the left elbow. On occasion sharp pain.  Picked up a garbage bag last night  And felt sharp pain. Now she rates pain 4/10.  Also feels the hand has some weakness. She also has tingling in her  2nd and 3rd left fingers.   Will order MRI  At [facility]. Patient in agreement. Cervical spine as note below.  CLINICAL DATA:  Shoulder pain  EXAM: CERVICAL SPINE - COMPLETE 4+ VIEW  COMPARISON:  None.  FINDINGS: Reversal of cervical lordosis. Trace retrolisthesis C6 on C7. Vertebral body heights are maintained. Mild degenerative change at C4-C5. Moderate degenerative change at C5-C6 and C6-C7. Prevertebral soft tissue thickness is normal. 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 Trace retrolisthesis C5 on C6.   Electronically Signed   By: Donavan Foil M.D.   On: 05/01/2019 22:57

## 2019-05-06 NOTE — Progress Notes (Signed)
C-spine xray abnormal , patient still has pain down left arm with tingling in 3rd and 4th left fingers.

## 2019-05-06 NOTE — Progress Notes (Signed)
See telephone note. MRI getting scheduled.

## 2019-05-07 ENCOUNTER — Encounter: Payer: Self-pay | Admitting: Medical

## 2019-05-07 ENCOUNTER — Telehealth: Payer: BC Managed Care – PPO | Admitting: Medical

## 2019-05-09 ENCOUNTER — Telehealth: Payer: Self-pay

## 2019-05-09 NOTE — Telephone Encounter (Signed)
Order for Flexeril 5 mg po tid #21 with no refills called into [facility] per order [employer] [facility].

## 2019-05-27 ENCOUNTER — Other Ambulatory Visit: Payer: Self-pay | Admitting: *Deleted

## 2019-05-27 MED ORDER — METOPROLOL SUCCINATE ER 50 MG PO TB24: 50.0000 mg | ORAL_TABLET | Freq: Every day | ORAL | 0 refills | Status: DC

## 2019-05-28 ENCOUNTER — Other Ambulatory Visit: Payer: Self-pay | Admitting: Medical

## 2019-05-28 ENCOUNTER — Other Ambulatory Visit: Payer: Self-pay

## 2019-05-28 ENCOUNTER — Ambulatory Visit
Admission: RE | Admit: 2019-05-28 | Discharge: 2019-05-28 | Disposition: A | Discharge status: Home / Self Care | Payer: BC Managed Care – PPO | Source: Ambulatory Visit | Attending: Medical | Admitting: Medical

## 2019-05-28 DIAGNOSIS — M4802 Spinal stenosis, cervical region: Secondary | ICD-10-CM | POA: Diagnosis not present

## 2019-05-28 DIAGNOSIS — M25512 Pain in left shoulder: Secondary | ICD-10-CM

## 2019-05-28 DIAGNOSIS — M5030 Other cervical disc degeneration, unspecified cervical region: Secondary | ICD-10-CM

## 2019-05-28 NOTE — Progress Notes (Signed)
  Decrease in pain  4/10 sometimes with turning her head or lifting arm she will get a sharp pain in the arm.  Middle finger , index and thumb go numb during the  last couple of days  However her pain in the left  shoulder blade is not constant now and not interfering with daily activities. Now pain seems to radiate into the left elbowand some weakness in the left arm. Has been taking Lodine and Flexeril and has been treated with a steroid dose pak to get patient comfortable. I reviewed with patient that she should not exercise or lift with the left arm for now.  I reviewed the cervical  MRI results with the patient. Recommend evaluation by Neurosurgery. They will review the chart/tests and advise of appointment timeframe.   Patient verbalizes understanding and has no questions at the end of our conversation.

## 2019-05-29 ENCOUNTER — Encounter: Payer: Self-pay | Admitting: Medical

## 2019-05-29 ENCOUNTER — Other Ambulatory Visit: Payer: Self-pay | Admitting: Medical

## 2019-06-04 ENCOUNTER — Other Ambulatory Visit: Payer: Self-pay | Admitting: Neurosurgery

## 2019-06-04 DIAGNOSIS — M4722 Other spondylosis with radiculopathy, cervical region: Secondary | ICD-10-CM | POA: Diagnosis not present

## 2019-06-04 DIAGNOSIS — M5030 Other cervical disc degeneration, unspecified cervical region: Secondary | ICD-10-CM | POA: Diagnosis not present

## 2019-06-04 DIAGNOSIS — M5020 Other cervical disc displacement, unspecified cervical region: Secondary | ICD-10-CM | POA: Diagnosis not present

## 2019-06-04 DIAGNOSIS — M542 Cervicalgia: Secondary | ICD-10-CM | POA: Diagnosis not present

## 2019-06-04 DIAGNOSIS — R29898 Other symptoms and signs involving the musculoskeletal system: Secondary | ICD-10-CM | POA: Diagnosis not present

## 2019-06-07 ENCOUNTER — Other Ambulatory Visit: Payer: Self-pay | Admitting: Neurosurgery

## 2019-06-07 NOTE — Pre-Procedure Instructions (Signed)
[id number] [place] [address] [place] [address] Phone: [phone] Fax: [phone]      Your procedure is scheduled on 06-12-19 Wednesday  Report to [facility] Main Entrance "A" at  0800 A.M., and check in at the Admitting office.  Call this number if you have problems the morning of surgery:  [phone]  Call [phone] if you have any questions prior to your surgery date Monday-Friday 8am-4pm    Remember:  Do not eat or drink after midnight the night before your surgery   Take these medicines the morning of surgery with A SIP OF WATER : metoprolol succinate (TOPROL-XL) cetirizine (ZYRTEC) fluticasone (FLONASE) ipratropium (ATROVENT) PARoxetine (PAXIL)  valACYclovir (VALTREX) as needed VENTOLIN HFA as needed  7 days prior to surgery STOP taking any Aspirin (unless otherwise instructed by your surgeon), Aleve, Naproxen, Ibuprofen, Motrin, Advil, Goody's, BC's, all herbal medications, fish oil, and all vitamins, ETODOLAC    The Morning of Surgery  Do not wear jewelry, make-up or nail polish.  Do not wear lotions, powders, or perfumes, or deodorant  Do not shave 48 hours prior to surgery.  Men may shave face and neck.  Do not bring valuables to the hospital.  [facility] is not responsible for any belongings or valuables.  If you are a smoker, DO NOT Smoke 24 hours prior to surgery IF you wear a CPAP at night please bring your mask, tubing, and machine the morning of surgery   Remember that you must have someone to transport you home after your surgery, and remain with you for 24 hours if you are discharged the same day.   Contacts, glasses, hearing aids, dentures or bridgework may not be worn into surgery.    Leave your suitcase in the car.  After surgery it may be brought to your room.  For patients admitted to the hospital, discharge time will be determined by your treatment team.  Patients discharged the  day of surgery will not be allowed to drive home.    Special instructions:   [place]- Preparing For Surgery  Before surgery, you can play an important role. Because skin is not sterile, your skin needs to be as free of germs as possible. You can reduce the number of germs on your skin by washing with CHG (chlorahexidine gluconate) Soap before surgery.  CHG is an antiseptic cleaner which kills germs and bonds with the skin to continue killing germs even after washing.    Oral Hygiene is also important to reduce your risk of infection.  Remember - BRUSH YOUR TEETH THE MORNING OF SURGERY WITH YOUR REGULAR TOOTHPASTE  Please do not use if you have an allergy to CHG or antibacterial soaps. If your skin becomes reddened/irritated stop using the CHG.  Do not shave (including legs and underarms) for at least 48 hours prior to first CHG shower. It is OK to shave your face.  Please follow these instructions carefully.   1. Shower the NIGHT BEFORE SURGERY and the MORNING OF SURGERY with CHG Soap.   2. If you chose to wash your hair, wash your hair first as usual with your normal shampoo.  3. After you shampoo, rinse your hair and body thoroughly to remove the shampoo.  4. Use CHG as you would any other liquid soap. You can apply CHG directly to the skin and wash gently with a scrungie or a clean washcloth.   5. Apply the CHG Soap to your  body ONLY FROM THE NECK DOWN.  Do not use on open wounds or open sores. Avoid contact with your eyes, ears, mouth and genitals (private parts). Wash Face and genitals (private parts)  with your normal soap.   6. Wash thoroughly, paying special attention to the area where your surgery will be performed.  7. Thoroughly rinse your body with warm water from the neck down.  8. DO NOT shower/wash with your normal soap after using and rinsing off the CHG Soap.  9. Pat yourself dry with a CLEAN TOWEL.  10. Wear CLEAN PAJAMAS to bed the night before surgery, wear  comfortable clothes the morning of surgery  11. Place CLEAN SHEETS on your bed the night of your first shower and DO NOT SLEEP WITH PETS.    Day of Surgery:  Do not apply any deodorants/lotions. Please shower the morning of surgery with the CHG soap  Please wear clean clothes to the hospital/surgery center.   Remember to brush your teeth WITH YOUR REGULAR TOOTHPASTE.   Please read over the following fact sheets that you were given.

## 2019-06-10 ENCOUNTER — Other Ambulatory Visit: Payer: Self-pay

## 2019-06-10 ENCOUNTER — Encounter (HOSPITAL_COMMUNITY)
Admission: RE | Admit: 2019-06-10 | Discharge: 2019-06-10 | Disposition: A | Discharge status: Home / Self Care | Payer: BC Managed Care – PPO | Source: Ambulatory Visit | Attending: Neurosurgery | Admitting: Neurosurgery

## 2019-06-10 ENCOUNTER — Encounter (HOSPITAL_COMMUNITY): Payer: Self-pay

## 2019-06-10 ENCOUNTER — Other Ambulatory Visit (HOSPITAL_COMMUNITY)
Admission: RE | Admit: 2019-06-10 | Discharge: 2019-06-10 | Disposition: A | Discharge status: Home / Self Care | Payer: BC Managed Care – PPO | Source: Ambulatory Visit | Attending: Neurosurgery | Admitting: Neurosurgery

## 2019-06-10 DIAGNOSIS — Z01812 Encounter for preprocedural laboratory examination: Secondary | ICD-10-CM | POA: Insufficient documentation

## 2019-06-10 DIAGNOSIS — Z20828 Contact with and (suspected) exposure to other viral communicable diseases: Secondary | ICD-10-CM | POA: Diagnosis not present

## 2019-06-10 HISTORY — DX: Essential (primary) hypertension: I10

## 2019-06-10 HISTORY — DX: Nausea with vomiting, unspecified: R11.2

## 2019-06-10 HISTORY — DX: Other complications of anesthesia, initial encounter: T88.59XA

## 2019-06-10 HISTORY — DX: Other specified postprocedural states: Z98.890

## 2019-06-10 LAB — BASIC METABOLIC PANEL
Anion gap: 10 (ref 5–15)
BUN: 9 mg/dL (ref 6–20)
CO2: 23 mmol/L (ref 22–32)
Calcium: 9.1 mg/dL (ref 8.9–10.3)
Chloride: 104 mmol/L (ref 98–111)
Creatinine, Ser: 0.89 mg/dL (ref 0.44–1.00)
GFR calc Af Amer: >60 mL/min (ref >60)
GFR calc non Af Amer: >60 mL/min (ref >60)
Glucose, Bld: 135 mg/dL — ABNORMAL HIGH (ref 70–99)
Potassium: 4.1 mmol/L (ref 3.5–5.1)
Sodium: 137 mmol/L (ref 135–145)

## 2019-06-10 LAB — CBC
HCT: 41.6 % (ref 36.0–46.0)
Hemoglobin: 13.5 g/dL (ref 12.0–15.0)
MCH: 28.5 pg (ref 26.0–34.0)
MCHC: 32.5 g/dL (ref 30.0–36.0)
MCV: 87.9 fL (ref 80.0–100.0)
Platelets: 316 10*3/uL (ref 150–400)
RBC: 4.73 MIL/uL (ref 3.87–5.11)
RDW: 12.9 % (ref 11.5–15.5)
WBC: 6.2 10*3/uL (ref 4.0–10.5)
nRBC: 0 % (ref 0.0–0.2)

## 2019-06-10 LAB — SARS CORONAVIRUS 2 (TAT 6-24 HRS): SARS Coronavirus 2: NEGATIVE

## 2019-06-10 LAB — ABO/RH: ABO/RH(D): A POS

## 2019-06-10 LAB — SURGICAL PCR SCREEN
MRSA, PCR: NEGATIVE
Staphylococcus aureus: NEGATIVE

## 2019-06-10 NOTE — Progress Notes (Addendum)
PCP - Daryll Drown, PA-C Cardiologist - Kathlyn Sacramento, MD  Chest x-ray - denies EKG - 07/17/2018 in [facility]  Stress Test - 2013 in EP[facility] ECHO - 2013 in [facility]  Cardiac Cath - denies  Sleep Study - denies CPAP - denies  Fasting Blood Sugar - n/a Checks Blood Sugar _____ times a day-n/a  Blood Thinner Instructions: n/a Aspirin Instructions: n/a  Anesthesia review: no  Patient denies shortness of breath, fever, cough and chest pain at PAT appointment  Patient verbalized understanding of instructions that were given to them at the PAT appointment. Patient was also instructed that they will need to review over the PAT instructions again at home before surgery.

## 2019-06-12 ENCOUNTER — Encounter (HOSPITAL_COMMUNITY): Payer: Self-pay

## 2019-06-12 ENCOUNTER — Encounter (HOSPITAL_COMMUNITY)
Admission: RE | Disposition: A | Discharge status: Home / Self Care | Payer: Self-pay | Source: Home / Self Care | Attending: Neurosurgery

## 2019-06-12 ENCOUNTER — Inpatient Hospital Stay (HOSPITAL_COMMUNITY): Payer: BC Managed Care – PPO | Admitting: Certified Registered Nurse Anesthetist

## 2019-06-12 ENCOUNTER — Inpatient Hospital Stay (HOSPITAL_COMMUNITY): Payer: BC Managed Care – PPO

## 2019-06-12 ENCOUNTER — Other Ambulatory Visit: Payer: Self-pay

## 2019-06-12 ENCOUNTER — Inpatient Hospital Stay (HOSPITAL_COMMUNITY)
Admission: RE | Admit: 2019-06-12 | Discharge: 2019-06-13 | DRG: 473 | Disposition: A | Discharge status: Home / Self Care | Payer: BC Managed Care – PPO | Attending: Neurosurgery | Admitting: Neurosurgery

## 2019-06-12 ENCOUNTER — Telehealth: Payer: Self-pay | Admitting: Medical

## 2019-06-12 DIAGNOSIS — M2578 Osteophyte, vertebrae: Secondary | ICD-10-CM | POA: Diagnosis not present

## 2019-06-12 DIAGNOSIS — Z803 Family history of malignant neoplasm of breast: Secondary | ICD-10-CM | POA: Diagnosis not present

## 2019-06-12 DIAGNOSIS — Z85828 Personal history of other malignant neoplasm of skin: Secondary | ICD-10-CM | POA: Diagnosis not present

## 2019-06-12 DIAGNOSIS — Z8249 Family history of ischemic heart disease and other diseases of the circulatory system: Secondary | ICD-10-CM | POA: Diagnosis not present

## 2019-06-12 DIAGNOSIS — K219 Gastro-esophageal reflux disease without esophagitis: Secondary | ICD-10-CM | POA: Diagnosis not present

## 2019-06-12 DIAGNOSIS — M5020 Other cervical disc displacement, unspecified cervical region: Secondary | ICD-10-CM | POA: Diagnosis present

## 2019-06-12 DIAGNOSIS — J45909 Unspecified asthma, uncomplicated: Secondary | ICD-10-CM | POA: Diagnosis present

## 2019-06-12 DIAGNOSIS — Z882 Allergy status to sulfonamides status: Secondary | ICD-10-CM

## 2019-06-12 DIAGNOSIS — Z87892 Personal history of anaphylaxis: Secondary | ICD-10-CM

## 2019-06-12 DIAGNOSIS — Z880 Allergy status to penicillin: Secondary | ICD-10-CM | POA: Diagnosis not present

## 2019-06-12 DIAGNOSIS — R1310 Dysphagia, unspecified: Secondary | ICD-10-CM | POA: Diagnosis not present

## 2019-06-12 DIAGNOSIS — Z885 Allergy status to narcotic agent status: Secondary | ICD-10-CM | POA: Diagnosis not present

## 2019-06-12 DIAGNOSIS — Z87440 Personal history of urinary (tract) infections: Secondary | ICD-10-CM

## 2019-06-12 DIAGNOSIS — Z808 Family history of malignant neoplasm of other organs or systems: Secondary | ICD-10-CM | POA: Diagnosis not present

## 2019-06-12 DIAGNOSIS — M5010 Cervical disc disorder with radiculopathy, unspecified cervical region: Secondary | ICD-10-CM | POA: Diagnosis not present

## 2019-06-12 DIAGNOSIS — I10 Essential (primary) hypertension: Secondary | ICD-10-CM | POA: Diagnosis present

## 2019-06-12 DIAGNOSIS — Z419 Encounter for procedure for purposes other than remedying health state, unspecified: Secondary | ICD-10-CM

## 2019-06-12 DIAGNOSIS — M50221 Other cervical disc displacement at C4-C5 level: Secondary | ICD-10-CM | POA: Diagnosis not present

## 2019-06-12 DIAGNOSIS — Z91040 Latex allergy status: Secondary | ICD-10-CM

## 2019-06-12 DIAGNOSIS — Z9049 Acquired absence of other specified parts of digestive tract: Secondary | ICD-10-CM

## 2019-06-12 DIAGNOSIS — Z881 Allergy status to other antibiotic agents status: Secondary | ICD-10-CM

## 2019-06-12 DIAGNOSIS — Z889 Allergy status to unspecified drugs, medicaments and biological substances status: Secondary | ICD-10-CM

## 2019-06-12 DIAGNOSIS — M4722 Other spondylosis with radiculopathy, cervical region: Secondary | ICD-10-CM | POA: Diagnosis present

## 2019-06-12 DIAGNOSIS — M4322 Fusion of spine, cervical region: Secondary | ICD-10-CM | POA: Diagnosis not present

## 2019-06-12 DIAGNOSIS — E119 Type 2 diabetes mellitus without complications: Secondary | ICD-10-CM | POA: Diagnosis not present

## 2019-06-12 DIAGNOSIS — M50121 Cervical disc disorder at C4-C5 level with radiculopathy: Principal | ICD-10-CM | POA: Diagnosis present

## 2019-06-12 DIAGNOSIS — Z800 Family history of malignant neoplasm of digestive organs: Secondary | ICD-10-CM

## 2019-06-12 DIAGNOSIS — Z91013 Allergy to seafood: Secondary | ICD-10-CM

## 2019-06-12 HISTORY — PX: ANTERIOR CERVICAL DECOMP/DISCECTOMY FUSION: SHX1161

## 2019-06-12 SURGERY — ANTERIOR CERVICAL DECOMPRESSION/DISCECTOMY FUSION 3 LEVELS
Anesthesia: General | Site: Spine Cervical

## 2019-06-12 MED ORDER — ONDANSETRON HCL 4 MG/2ML IJ SOLN
INTRAMUSCULAR | Status: AC
  Filled 2019-06-12: qty 2

## 2019-06-12 MED ORDER — SODIUM CHLORIDE 0.9 % IV SOLN: 250.0000 mL | INTRAVENOUS | Status: DC

## 2019-06-12 MED ORDER — FENTANYL CITRATE (PF) 100 MCG/2ML IJ SOLN: 25.0000 ug | INTRAMUSCULAR | Status: DC | PRN

## 2019-06-12 MED ORDER — PROPOFOL 10 MG/ML IV BOLUS
INTRAVENOUS | Status: AC
  Filled 2019-06-12: qty 20

## 2019-06-12 MED ORDER — LIDOCAINE 2% (20 MG/ML) 5 ML SYRINGE
INTRAMUSCULAR | Status: DC | PRN
  Administered 2019-06-12: 60 mg via INTRAVENOUS

## 2019-06-12 MED ORDER — LACTATED RINGERS IV SOLN
INTRAVENOUS | Status: DC
  Administered 2019-06-12: 09:00:00 via INTRAVENOUS

## 2019-06-12 MED ORDER — KETOROLAC TROMETHAMINE 30 MG/ML IJ SOLN: 30.0000 mg | Freq: Four times a day (QID) | INTRAMUSCULAR | Status: DC

## 2019-06-12 MED ORDER — ACETAMINOPHEN 325 MG PO TABS: 650.0000 mg | ORAL_TABLET | ORAL | Status: DC | PRN

## 2019-06-12 MED ORDER — THROMBIN 20000 UNITS EX SOLR
CUTANEOUS | Status: DC | PRN
  Administered 2019-06-12: 20 mL via TOPICAL

## 2019-06-12 MED ORDER — MENTHOL 3 MG MT LOZG: 1.0000 | LOZENGE | OROMUCOSAL | Status: DC | PRN

## 2019-06-12 MED ORDER — MORPHINE SULFATE (PF) 4 MG/ML IV SOLN: 4.0000 mg | INTRAVENOUS | Status: DC | PRN

## 2019-06-12 MED ORDER — ALUM & MAG HYDROXIDE-SIMETH 200-200-20 MG/5ML PO SUSP: 30.0000 mL | Freq: Four times a day (QID) | ORAL | Status: DC | PRN

## 2019-06-12 MED ORDER — ROCURONIUM BROMIDE 10 MG/ML (PF) SYRINGE
PREFILLED_SYRINGE | INTRAVENOUS | Status: DC | PRN
  Administered 2019-06-12 (×2): 50 mg via INTRAVENOUS

## 2019-06-12 MED ORDER — LIDOCAINE-EPINEPHRINE 1 %-1:100000 IJ SOLN
INTRAMUSCULAR | Status: DC | PRN
  Administered 2019-06-12: 5 mL

## 2019-06-12 MED ORDER — ALBUTEROL SULFATE HFA 108 (90 BASE) MCG/ACT IN AERS
2.0000 | INHALATION_SPRAY | Freq: Four times a day (QID) | RESPIRATORY_TRACT | Status: DC | PRN
  Filled 2019-06-12: qty 6.7

## 2019-06-12 MED ORDER — LORATADINE 10 MG PO TABS
10.0000 mg | ORAL_TABLET | Freq: Every day | ORAL | Status: DC
  Administered 2019-06-13: 10 mg via ORAL
  Filled 2019-06-12: qty 1

## 2019-06-12 MED ORDER — ACETAMINOPHEN 10 MG/ML IV SOLN
INTRAVENOUS | Status: DC | PRN
  Administered 2019-06-12: 1000 mg via INTRAVENOUS

## 2019-06-12 MED ORDER — HYDROXYZINE HCL 50 MG/ML IM SOLN: 50.0000 mg | INTRAMUSCULAR | Status: DC | PRN

## 2019-06-12 MED ORDER — FENTANYL CITRATE (PF) 250 MCG/5ML IJ SOLN
INTRAMUSCULAR | Status: AC
  Filled 2019-06-12: qty 5

## 2019-06-12 MED ORDER — DEXAMETHASONE SODIUM PHOSPHATE 10 MG/ML IJ SOLN
10.0000 mg | Freq: Once | INTRAMUSCULAR | Status: AC
  Administered 2019-06-12: 10 mg via INTRAVENOUS
  Filled 2019-06-12: qty 1

## 2019-06-12 MED ORDER — ONDANSETRON HCL 4 MG PO TABS: 4.0000 mg | ORAL_TABLET | Freq: Four times a day (QID) | ORAL | Status: DC | PRN

## 2019-06-12 MED ORDER — THROMBIN 5000 UNITS EX SOLR
CUTANEOUS | Status: AC
  Filled 2019-06-12: qty 5000

## 2019-06-12 MED ORDER — THROMBIN 20000 UNITS EX SOLR
CUTANEOUS | Status: AC
  Filled 2019-06-12: qty 20000

## 2019-06-12 MED ORDER — BUPIVACAINE HCL (PF) 0.5 % IJ SOLN
INTRAMUSCULAR | Status: AC
  Filled 2019-06-12: qty 30

## 2019-06-12 MED ORDER — BISACODYL 10 MG RE SUPP: 10.0000 mg | Freq: Every day | RECTAL | Status: DC | PRN

## 2019-06-12 MED ORDER — ONDANSETRON HCL 4 MG/2ML IJ SOLN
INTRAMUSCULAR | Status: DC | PRN
  Administered 2019-06-12: 4 mg via INTRAVENOUS

## 2019-06-12 MED ORDER — ACETAMINOPHEN 650 MG RE SUPP: 650.0000 mg | RECTAL | Status: DC | PRN

## 2019-06-12 MED ORDER — HYDROMORPHONE HCL 2 MG PO TABS
1.0000 mg | ORAL_TABLET | ORAL | Status: DC | PRN
  Administered 2019-06-12 – 2019-06-13 (×4): 2 mg via ORAL
  Filled 2019-06-12 (×5): qty 1

## 2019-06-12 MED ORDER — DEXAMETHASONE SODIUM PHOSPHATE 4 MG/ML IJ SOLN
4.0000 mg | Freq: Four times a day (QID) | INTRAMUSCULAR | Status: DC
  Administered 2019-06-12 – 2019-06-13 (×5): 4 mg via INTRAVENOUS
  Filled 2019-06-12 (×5): qty 1

## 2019-06-12 MED ORDER — CYCLOBENZAPRINE HCL 5 MG PO TABS
5.0000 mg | ORAL_TABLET | Freq: Three times a day (TID) | ORAL | Status: DC | PRN
  Administered 2019-06-12: 10 mg via ORAL

## 2019-06-12 MED ORDER — FLEET ENEMA 7-19 GM/118ML RE ENEM: 1.0000 | ENEMA | Freq: Once | RECTAL | Status: DC | PRN

## 2019-06-12 MED ORDER — OXYCODONE HCL 5 MG/5ML PO SOLN: 5.0000 mg | Freq: Once | ORAL | Status: DC | PRN

## 2019-06-12 MED ORDER — KCL IN DEXTROSE-NACL 20-5-0.45 MEQ/L-%-% IV SOLN: INTRAVENOUS | Status: DC

## 2019-06-12 MED ORDER — METOPROLOL SUCCINATE ER 25 MG PO TB24
25.0000 mg | ORAL_TABLET | Freq: Every day | ORAL | Status: DC
  Administered 2019-06-13: 25 mg via ORAL
  Filled 2019-06-12 (×2): qty 1

## 2019-06-12 MED ORDER — FAMOTIDINE IN NACL 20-0.9 MG/50ML-% IV SOLN
20.0000 mg | Freq: Once | INTRAVENOUS | Status: AC
  Administered 2019-06-12: 20 mg via INTRAVENOUS
  Filled 2019-06-12: qty 50

## 2019-06-12 MED ORDER — PHENYLEPHRINE 40 MCG/ML (10ML) SYRINGE FOR IV PUSH (FOR BLOOD PRESSURE SUPPORT)
PREFILLED_SYRINGE | INTRAVENOUS | Status: AC
  Filled 2019-06-12: qty 10

## 2019-06-12 MED ORDER — FENTANYL CITRATE (PF) 250 MCG/5ML IJ SOLN
INTRAMUSCULAR | Status: DC | PRN
  Administered 2019-06-12: 25 ug via INTRAVENOUS
  Administered 2019-06-12 (×4): 50 ug via INTRAVENOUS
  Administered 2019-06-12: 25 ug via INTRAVENOUS
  Administered 2019-06-12 (×2): 50 ug via INTRAVENOUS

## 2019-06-12 MED ORDER — LIDOCAINE-EPINEPHRINE 1 %-1:100000 IJ SOLN
INTRAMUSCULAR | Status: AC
  Filled 2019-06-12: qty 1

## 2019-06-12 MED ORDER — ALBUTEROL SULFATE (2.5 MG/3ML) 0.083% IN NEBU: 2.5000 mg | INHALATION_SOLUTION | Freq: Four times a day (QID) | RESPIRATORY_TRACT | Status: DC | PRN

## 2019-06-12 MED ORDER — THROMBIN 5000 UNITS EX SOLR
OROMUCOSAL | Status: DC | PRN
  Administered 2019-06-12: 5 mL via TOPICAL

## 2019-06-12 MED ORDER — ACETAMINOPHEN 10 MG/ML IV SOLN
INTRAVENOUS | Status: AC
  Filled 2019-06-12: qty 100

## 2019-06-12 MED ORDER — MIDAZOLAM HCL 2 MG/2ML IJ SOLN
INTRAMUSCULAR | Status: AC
  Filled 2019-06-12: qty 2

## 2019-06-12 MED ORDER — CYCLOBENZAPRINE HCL 10 MG PO TABS
ORAL_TABLET | ORAL | Status: AC
  Filled 2019-06-12: qty 1

## 2019-06-12 MED ORDER — 0.9 % SODIUM CHLORIDE (POUR BTL) OPTIME
TOPICAL | Status: DC | PRN
  Administered 2019-06-12: 1000 mL

## 2019-06-12 MED ORDER — OXYCODONE HCL 5 MG PO TABS: 5.0000 mg | ORAL_TABLET | Freq: Once | ORAL | Status: DC | PRN

## 2019-06-12 MED ORDER — KETOROLAC TROMETHAMINE 30 MG/ML IJ SOLN
INTRAMUSCULAR | Status: AC
  Filled 2019-06-12: qty 1

## 2019-06-12 MED ORDER — PROPOFOL 10 MG/ML IV BOLUS
INTRAVENOUS | Status: DC | PRN
  Administered 2019-06-12: 150 mg via INTRAVENOUS

## 2019-06-12 MED ORDER — LINEZOLID 600 MG/300ML IV SOLN
600.0000 mg | INTRAVENOUS | Status: AC
  Administered 2019-06-12: 600 mg via INTRAVENOUS
  Filled 2019-06-12: qty 300

## 2019-06-12 MED ORDER — MIDAZOLAM HCL 2 MG/2ML IJ SOLN
INTRAMUSCULAR | Status: DC | PRN
  Administered 2019-06-12: 2 mg via INTRAVENOUS

## 2019-06-12 MED ORDER — HYDROMORPHONE HCL 1 MG/ML PO LIQD: 1.0000 mg | ORAL | Status: DC | PRN

## 2019-06-12 MED ORDER — FLUTICASONE PROPIONATE 50 MCG/ACT NA SUSP
2.0000 | Freq: Every day | NASAL | Status: DC
  Filled 2019-06-12: qty 16

## 2019-06-12 MED ORDER — HYDROXYZINE HCL 25 MG PO TABS: 50.0000 mg | ORAL_TABLET | ORAL | Status: DC | PRN

## 2019-06-12 MED ORDER — SODIUM CHLORIDE 0.9% FLUSH: 3.0000 mL | INTRAVENOUS | Status: DC | PRN

## 2019-06-12 MED ORDER — SODIUM CHLORIDE 0.9 % IV SOLN
1.0000 g | INTRAVENOUS | Status: AC
  Administered 2019-06-12: 1 g via INTRAVENOUS
  Filled 2019-06-12 (×3): qty 1

## 2019-06-12 MED ORDER — CHLORHEXIDINE GLUCONATE CLOTH 2 % EX PADS: 6.0000 | MEDICATED_PAD | Freq: Once | CUTANEOUS | Status: DC

## 2019-06-12 MED ORDER — BUPIVACAINE HCL (PF) 0.5 % IJ SOLN
INTRAMUSCULAR | Status: DC | PRN
  Administered 2019-06-12: 5 mL

## 2019-06-12 MED ORDER — ONDANSETRON HCL 4 MG/2ML IJ SOLN: 4.0000 mg | Freq: Four times a day (QID) | INTRAMUSCULAR | Status: DC | PRN

## 2019-06-12 MED ORDER — MAGNESIUM HYDROXIDE 400 MG/5ML PO SUSP: 30.0000 mL | Freq: Every day | ORAL | Status: DC | PRN

## 2019-06-12 MED ORDER — ONDANSETRON HCL 4 MG/2ML IJ SOLN
4.0000 mg | Freq: Four times a day (QID) | INTRAMUSCULAR | Status: AC | PRN
  Administered 2019-06-12: 4 mg via INTRAVENOUS

## 2019-06-12 MED ORDER — SODIUM CHLORIDE 0.9 % IV SOLN
INTRAVENOUS | Status: DC | PRN
  Administered 2019-06-12: 25 ug/min via INTRAVENOUS

## 2019-06-12 MED ORDER — SUGAMMADEX SODIUM 200 MG/2ML IV SOLN
INTRAVENOUS | Status: DC | PRN
  Administered 2019-06-12: 350 mg via INTRAVENOUS

## 2019-06-12 MED ORDER — DEXAMETHASONE SODIUM PHOSPHATE 10 MG/ML IJ SOLN
INTRAMUSCULAR | Status: AC
  Filled 2019-06-12: qty 1

## 2019-06-12 MED ORDER — SODIUM CHLORIDE 0.9% FLUSH
3.0000 mL | Freq: Two times a day (BID) | INTRAVENOUS | Status: DC
  Administered 2019-06-12 – 2019-06-13 (×2): 3 mL via INTRAVENOUS

## 2019-06-12 MED ORDER — IPRATROPIUM BROMIDE 0.03 % NA SOLN
2.0000 | Freq: Three times a day (TID) | NASAL | Status: DC
  Filled 2019-06-12: qty 30

## 2019-06-12 MED ORDER — SODIUM CHLORIDE 0.9 % IV SOLN
INTRAVENOUS | Status: DC | PRN
  Administered 2019-06-12: 500 mL

## 2019-06-12 MED ORDER — DIPHENHYDRAMINE HCL 50 MG/ML IJ SOLN
25.0000 mg | Freq: Once | INTRAMUSCULAR | Status: AC
  Administered 2019-06-12: 25 mg via INTRAVENOUS
  Filled 2019-06-12: qty 1

## 2019-06-12 MED ORDER — PAROXETINE HCL 10 MG PO TABS
10.0000 mg | ORAL_TABLET | Freq: Every day | ORAL | Status: DC
  Administered 2019-06-13: 10 mg via ORAL
  Filled 2019-06-12: qty 1

## 2019-06-12 MED ORDER — PHENOL 1.4 % MT LIQD: 1.0000 | OROMUCOSAL | Status: DC | PRN

## 2019-06-12 MED ORDER — KETOROLAC TROMETHAMINE 30 MG/ML IJ SOLN
30.0000 mg | Freq: Four times a day (QID) | INTRAMUSCULAR | Status: DC
  Administered 2019-06-12 – 2019-06-13 (×4): 30 mg via INTRAVENOUS
  Filled 2019-06-12 (×4): qty 1

## 2019-06-12 SURGICAL SUPPLY — 62 items
ADH SKN CLS APL DERMABOND .7 (GAUZE/BANDAGES/DRESSINGS) ×1
ALLOGRAFT CA 6X14X11 (Bone Implant) ×3 IMPLANT
BAG DECANTER FOR FLEXI CONT (MISCELLANEOUS) ×2 IMPLANT
BIT DRILL 13 (BIT) ×1 IMPLANT
BIT DRILL NEURO 2X3.1 SFT TUCH (MISCELLANEOUS) ×1 IMPLANT
CANISTER SUCT 3000ML PPV (MISCELLANEOUS) ×2 IMPLANT
CARTRIDGE OIL MAESTRO DRILL (MISCELLANEOUS) ×1 IMPLANT
COVER MAYO STAND STRL (DRAPES) ×2 IMPLANT
DERMABOND ADVANCED (GAUZE/BANDAGES/DRESSINGS) ×1
DERMABOND ADVANCED .7 DNX12 (GAUZE/BANDAGES/DRESSINGS) ×1 IMPLANT
DIFFUSER DRILL AIR PNEUMATIC (MISCELLANEOUS) ×2 IMPLANT
DRAPE HALF SHEET 40X57 (DRAPES) ×1 IMPLANT
DRAPE LAPAROTOMY 100X72 PEDS (DRAPES) ×2 IMPLANT
DRAPE MICROSCOPE LEICA (MISCELLANEOUS) ×2 IMPLANT
DRAPE POUCH INSTRU U-SHP 10X18 (DRAPES) ×2 IMPLANT
DRILL NEURO 2X3.1 SOFT TOUCH (MISCELLANEOUS) ×2
ELECT COATED BLADE 2.86 ST (ELECTRODE) ×2 IMPLANT
ELECT REM PT RETURN 9FT ADLT (ELECTROSURGICAL) ×2
ELECTRODE REM PT RTRN 9FT ADLT (ELECTROSURGICAL) ×1 IMPLANT
GLOVE BIOGEL PI IND STRL 6.5 (GLOVE) IMPLANT
GLOVE BIOGEL PI IND STRL 7.0 (GLOVE) IMPLANT
GLOVE BIOGEL PI IND STRL 7.5 (GLOVE) IMPLANT
GLOVE BIOGEL PI IND STRL 8 (GLOVE) ×1 IMPLANT
GLOVE BIOGEL PI INDICATOR 6.5 (GLOVE) ×1
GLOVE BIOGEL PI INDICATOR 7.0 (GLOVE) ×2
GLOVE BIOGEL PI INDICATOR 7.5 (GLOVE) ×2
GLOVE BIOGEL PI INDICATOR 8 (GLOVE) ×2
GLOVE SS N UNI LF 6.5 STRL (GLOVE) ×2 IMPLANT
GLOVE SS N UNI LF 7.0 STRL (GLOVE) ×2 IMPLANT
GLOVE SURG SS PI 6.0 STRL IVOR (GLOVE) ×2 IMPLANT
GOWN STRL REUS W/ TWL LRG LVL3 (GOWN DISPOSABLE) IMPLANT
GOWN STRL REUS W/ TWL XL LVL3 (GOWN DISPOSABLE) ×1 IMPLANT
GOWN STRL REUS W/TWL 2XL LVL3 (GOWN DISPOSABLE) IMPLANT
GOWN STRL REUS W/TWL LRG LVL3 (GOWN DISPOSABLE) ×6
GOWN STRL REUS W/TWL XL LVL3 (GOWN DISPOSABLE) ×2
HALTER HD/CHIN CERV TRACTION D (MISCELLANEOUS) ×2 IMPLANT
HEMOSTAT POWDER KIT SURGIFOAM (HEMOSTASIS) ×2 IMPLANT
KIT BASIN OR (CUSTOM PROCEDURE TRAY) ×2 IMPLANT
KIT TURNOVER KIT B (KITS) ×2 IMPLANT
NDL HYPO 25GX1X1/2 BEV (NEEDLE) ×1 IMPLANT
NDL SPNL 22GX3.5 QUINCKE BK (NEEDLE) ×1 IMPLANT
NEEDLE HYPO 25GX1X1/2 BEV (NEEDLE) ×2 IMPLANT
NEEDLE SPNL 22GX3.5 QUINCKE BK (NEEDLE) ×4 IMPLANT
NS IRRIG 1000ML POUR BTL (IV SOLUTION) ×2 IMPLANT
OIL CARTRIDGE MAESTRO DRILL (MISCELLANEOUS) ×2
PACK LAMINECTOMY NEURO (CUSTOM PROCEDURE TRAY) ×2 IMPLANT
PAD ARMBOARD 7.5X6 YLW CONV (MISCELLANEOUS) ×6 IMPLANT
PLATE 3 55XLCK NS SPNE CVD (Plate) IMPLANT
PLATE 3 ATLANTIS TRANS (Plate) ×2 IMPLANT
RUBBERBAND STERILE (MISCELLANEOUS) ×4 IMPLANT
SCREW ST FIX 4 ATL 3120213 (Screw) ×8 IMPLANT
SPONGE INTESTINAL PEANUT (DISPOSABLE) ×2 IMPLANT
SPONGE SURGIFOAM ABS GEL 100 (HEMOSTASIS) ×2 IMPLANT
STAPLER SKIN PROX WIDE 3.9 (STAPLE) ×1 IMPLANT
SUT VIC AB 0 CT1 18XCR BRD8 (SUTURE) IMPLANT
SUT VIC AB 0 CT1 8-18 (SUTURE)
SUT VIC AB 2-0 CP2 18 (SUTURE) ×2 IMPLANT
SUT VIC AB 3-0 SH 8-18 (SUTURE) ×2 IMPLANT
TOWEL GREEN STERILE (TOWEL DISPOSABLE) ×2 IMPLANT
TOWEL GREEN STERILE FF (TOWEL DISPOSABLE) IMPLANT
TRAY FOLEY METER SIL LF 16FR (CATHETERS) ×1 IMPLANT
WATER STERILE IRR 1000ML POUR (IV SOLUTION) ×2 IMPLANT

## 2019-06-12 NOTE — Telephone Encounter (Signed)
Called patient to see how she was doing.  Left message that I got Dr. Carrie Mew note about needing surgery.And hopefully this week her surgery was scheduled.

## 2019-06-12 NOTE — H&P (Signed)
Subjective: Patient is a 48 y.o. right-handed female who is admitted for treatment of 3 level cervical spondylitic disc herniations with underlying cervical spondylosis and degenerative disease, and resulting neck pain and left cervical radiculopathy with weakness of the left biceps, triceps, and grip, all of which are 4 to 4+/5.  Patient symptoms began in early June, with severe pain beneath the left scapula.  She cannot recall any inciting event that brought on the symptoms.  Pain subsequently extended down to the left shoulder, arm, forearm, and into the left hand.  She has been treated with NSAIDs without relief.  She has had numbness and tingling in the first, second, and third digits of the left hand.  She was studied with x-rays and MRI scan.  Patient is admitted now for a 3 level C4-5, C5-6, and C6-7 anterior cervical decompression and arthrodesis with structural allograft and cervical plating.   Patient Active Problem List   Diagnosis Date Noted  . Hyperkalemia 11/11/2018  . Intermittent palpitations 11/11/2018  . Hx of anaphylaxis 12/14/2017  . Allergy to multiple antibiotics 12/14/2017  . Genetic testing 06/14/2017  . Family history of breast cancer   . Family history of stomach cancer   . Family history of pancreatic cancer   . Emotional lability 01/23/2015  . Pain of left calf 09/24/2013  . Asthma, mild intermitent 12/18/2012  . Allergic rhinitis 12/18/2012  . Sinus tachycardia 12/15/2012  . Chest pain on breathing 11/09/2012  . Migraine 05/31/2012  . Hot flashes 04/23/2012  . Atopic dermatitis 04/05/2012  . Shellfish allergy 04/05/2012  . Sinusitis, chronic 04/05/2012  . Sinusitis, chronic 02/13/2012  . Allergy, drug 02/13/2012   Past Medical History:  Diagnosis Date  . Allergic rhinitis    Dr. Doreene Nest, s/p allergy testing  . Anemia   . Asthma   . Basal cell carcinoma of anterior chest    removed  . Breast cyst 2011   bx benign  . Complication of anesthesia   .  Depression    after father died, on [place] in past  . Family history of breast cancer   . Family history of pancreatic cancer   . Family history of stomach cancer   . GERD (gastroesophageal reflux disease)   . Gestational diabetes    with first child  . Headache(784.0)   . History of chicken pox   . Hypertension   . Inappropriate sinus node tachycardia   . Internal hemorrhoid 2006   dx during colon w/Dr Tiffany Kocher  . Migraines    associated with menses in past, tx with excedrin, may last up to 1 week  . Migraines   . Pericarditis    a. 10/2012 ETT: Ex time 9:00, No ST/T changes; b. 10/2012 Echo: EF 55-60%, no rwma, no pericardial effusion.  Marland Kitchen PONV (postoperative nausea and vomiting)    surgery in 2017 at [place], she had no n/v  . Shingles 04/2011  . UTI (lower urinary tract infection)     Past Surgical History:  Procedure Laterality Date  . BASAL CELL CARCINOMA EXCISION  2011   removed from chest/[facility]  . BREAST BIOPSY Right 07/25/2018   stereo bx coil clip- CYSTIC PAPILLARY APOCRINE METAPLASIA  . CHOLECYSTECTOMY  2010   [facility]  Marland Kitchen COLONOSCOPY    . KNEE ARTHROSCOPY W/ MENISCAL REPAIR  1989   Right, Dr. Mauri Pole  . NASAL SINUS SURGERY  1999, 2005  . REDUCTION MAMMAPLASTY  2017  . TONSILLECTOMY  1988  No medications prior to admission.   Allergies  Allergen Reactions  . Amoxicillin Anaphylaxis  . Ceftriaxone Anaphylaxis  . Erythromycin Anaphylaxis  . Levofloxacin Anaphylaxis  . Rocephin [Ceftriaxone Sodium In Dextrose] Anaphylaxis  . Shellfish Allergy Anaphylaxis  . Tetracyclines & Related Anaphylaxis  . Amoxicillin-Pot Clavulanate Hives  . Clindamycin/Lincomycin Hives  . Codeine Hives  . Latex   . Sulfa Antibiotics Hives  . Vancomycin Rash    Social History   Tobacco Use  . Smoking status: Never Smoker  . Smokeless tobacco: Never Used  Substance Use Topics  . Alcohol use: Yes    Alcohol/week: 5.0 standard drinks     Types: 5 Glasses of wine per week    Family History  Problem Relation Age of Onset  . Hypertension Mother   . Cancer Mother 26       Breast Ca  . Arthritis Mother   . Breast cancer Mother 75       2nd primary  . Cancer Father 53       Pancreatic  . Diabetes Father   . Hypertension Father   . Hyperlipidemia Father   . Heart disease Father   . Cancer Sister 108       Thyroid  . Cancer Maternal Grandmother        might have had cancer, died at 35  . Alcohol abuse Maternal Grandfather   . Pancreatic cancer Maternal Grandfather 56       died in his 10's  . Alcohol abuse Paternal Grandfather   . Heart disease Paternal Grandfather   . Hyperlipidemia Paternal Grandfather   . Hypertension Paternal Grandfather   . Other Paternal Grandmother        brain tumor on pitutary gland  . Brain cancer Paternal Grandmother 10       pituitary tumor, died in her 6's  . Breast cancer Cousin 23       maternal couisn, is now in her 37's  . Stomach cancer Maternal Aunt 75       is now 21  . Stomach cancer Maternal Aunt 75       died in 70's  . Breast cancer Cousin 50       maternal cousin, is now 28     Review of Systems Pertinent items noted in HPI and remainder of comprehensive ROS otherwise negative.  EXAM: Patient is a well-developed well-nourished white female in no acute distress.   Lungs are clear to auscultation , the patient has symmetrical respiratory excursion. Heart has a regular rate and rhythm normal S1 and S2 no murmur.   Abdomen is soft nontender nondistended bowel sounds are present. Extremity examination shows no clubbing cyanosis or edema. Examination shows the deltoid is 5 bilaterally the left biceps, triceps, and grip are 4 to 4+/5, the right biceps, triceps, and grip are 5/5.  The intrinsics are 5/5 bilaterally.  Sensation is intact to pinprick in the distal upper extremities.  Reflex examination of the left biceps and brachioradialis are 1.  Right biceps is minimal, right  brachioradialis is trace, the triceps are 1 bilaterally.  Quadriceps and gastrocnemius are 2 bilaterally.  Toes are downgoing bilaterally.  She has a normal gait and stance.  Data Review:CBC    Component Value Date/Time   WBC 6.2 06/10/2019 0812   RBC 4.73 06/10/2019 0812   HGB 13.5 06/10/2019 0812   HGB 13.3 05/04/2017 1039   HGB 12.8 12/09/2014 1412   HCT 41.6 06/10/2019 0812   HCT  40.2 05/04/2017 1039   PLT 316 06/10/2019 0812   PLT 287 05/04/2017 1039   MCV 87.9 06/10/2019 0812   MCV 89 05/04/2017 1039   MCH 28.5 06/10/2019 0812   MCHC 32.5 06/10/2019 0812   RDW 12.9 06/10/2019 0812   RDW 13.5 05/04/2017 1039   LYMPHSABS 2.7 11/16/2012 1311   MONOABS 0.6 11/16/2012 1311   EOSABS 0.1 11/16/2012 1311   BASOSABS 0.0 11/16/2012 1311                          BMET    Component Value Date/Time   NA 137 06/10/2019 0812   NA 138 05/04/2017 1039   NA 143 11/09/2012 1703   K 4.1 06/10/2019 0812   K 4.0 11/09/2012 1703   CL 104 06/10/2019 0812   CL 106 11/09/2012 1703   CO2 23 06/10/2019 0812   CO2 27 11/09/2012 1703   GLUCOSE 135 (H) 06/10/2019 0812   GLUCOSE 106 (H) 11/09/2012 1703   BUN 9 06/10/2019 0812   BUN 10 05/04/2017 1039   BUN 13 11/09/2012 1703   CREATININE 0.89 06/10/2019 0812   CREATININE 0.86 12/09/2014 1509   CALCIUM 9.1 06/10/2019 0812   CALCIUM 9.0 11/09/2012 1703   GFRNONAA >60 06/10/2019 0812   GFRNONAA >60 11/09/2012 1703   GFRAA >60 06/10/2019 0812   GFRAA >60 11/09/2012 1703     Assessment/Plan: Patient with disabling left cervical radiculopathy with significant weakness in the left biceps, triceps, and grip, who has spondylitic disc herniations at C4-5, C5-6, and C6-7, and who is admitted now for 3 level anterior cervical decompression and arthrodesis.  I've discussed with the patient the nature of his condition, the nature the surgical procedure, the typical length of surgery, hospital stay, and overall recuperation. We discussed limitations  postoperatively. I discussed risks of surgery including risks of infection, bleeding, possibly need for transfusion, the risk of nerve root dysfunction with pain, weakness, numbness, or paresthesias, the risk of spinal cord dysfunction with paralysis of all 4 limbs and quadriplegia, and the risk of dural tear and CSF leakage and possible need for further surgery, the risk of esophageal dysfunction causing dysphagia and the risk of laryngeal dysfunction causing hoarseness of the voice, the risk of failure of the arthrodesis and the possible need for further surgery, and the risk of anesthetic complications including myocardial infarction, stroke, pneumonia, and death. We also discussed the need for postoperative immobilization in a cervical collar. Understanding all this the patient does wish to proceed with surgery and is admitted for such.  Hosie Spangle, MD 06/12/2019 7:24 AM

## 2019-06-12 NOTE — Transfer of Care (Signed)
Immediate Anesthesia Transfer of Care Note  Patient: Hailey Wilkins  Procedure(s) Performed: ANTERIOR CERVICAL DECOMPRESSION/DISCECTOMY FUSION CERVICAL FOUR- CERVICAL FIVE, CERVICAL FIVE- CERVICAL SIX, CERVICAL SIX- CERVICAL SEVEN (N/A Spine Cervical)  Patient Location: PACU  Anesthesia Type:General  Level of Consciousness: awake, alert  and oriented  Airway & Oxygen Therapy: Patient Spontanous Breathing and Patient connected to nasal cannula oxygen  Post-op Assessment: Report given to RN and Post -op Vital signs reviewed and stable  Post vital signs: Reviewed and stable  Last Vitals:  Vitals Value Taken Time  BP 104/67 06/12/19 1430  Temp 36.6 C 06/12/19 1400  Pulse 102 06/12/19 1432  Resp 16 06/12/19 1432  SpO2 92 % 06/12/19 1432  Vitals shown include unvalidated device data.  Last Pain:  Vitals:   06/12/19 1424  TempSrc:   PainSc: 9          Complications: No apparent anesthesia complications

## 2019-06-12 NOTE — Anesthesia Procedure Notes (Signed)
Procedure Name: Intubation Date/Time: 06/12/2019 10:34 AM Performed by: Clearnce Sorrel, CRNA Pre-anesthesia Checklist: Patient identified, Emergency Drugs available, Suction available, Patient being monitored and Timeout performed Patient Re-evaluated:Patient Re-evaluated prior to induction Oxygen Delivery Method: Circle system utilized Preoxygenation: Pre-oxygenation with 100% oxygen Induction Type: IV induction Ventilation: Mask ventilation without difficulty Laryngoscope Size: Glidescope and 3 (limited neck ROM) Grade View: Grade I Tube type: Oral Tube size: 7.0 mm Number of attempts: 1 Airway Equipment and Method: Stylet Placement Confirmation: ETT inserted through vocal cords under direct vision,  positive ETCO2 and breath sounds checked- equal and bilateral Secured at: 22 cm Tube secured with: Tape

## 2019-06-12 NOTE — Op Note (Signed)
06/12/2019  1:37 PM  PATIENT:  Hailey Wilkins  48 y.o. female  PRE-OPERATIVE DIAGNOSIS: Multilevel spondylitic cervical disc herniation, cervical spondylosis, cervical degenerative disease, cervical radiculopathy, left upper extremity weakness  POST-OPERATIVE DIAGNOSIS: Multilevel spondylitic cervical disc herniation, cervical spondylosis, cervical degenerative disease, cervical radiculopathy, left upper extremity weakness  PROCEDURE:  Procedure(s):  ANTERIOR CERVICAL DECOMPRESSION & ARTHRODESIS CERVICAL FOUR- CERVICAL FIVE, CERVICAL FIVE- CERVICAL SIX, CERVICAL SIX- CERVICAL SEVEN with structural allograft and Medtronic translational Atlantis cervical plating  SURGEON:  Surgeon(s): Jovita Gamma, MD  ASSISTANTS: Newman Pies, MD  ANESTHESIA:   general  EBL:  Total I/O In: 1200 [I.V.:1200] Out: 250 [Urine:200; Blood:50]  BLOOD ADMINISTERED:none  COUNT:   Correct per nursing staff  DICTATION: Patient was brought to the operating room placed under general endotracheal anesthesia. Patient was placed in 10 pounds of halter traction. The neck was prepped with Betadine soap and solution and draped in a sterile fashion. A obliquel incision was made on the left side of the neck paralleling the anterior border of the sternocleidomastoid.. The line of the incision was infiltrated with local anesthetic with epinephrine. Dissection was carried down thru the subcutaneous tissue and platysma, bipolar cautery was used to maintain hemostasis.  An anterior jugular vein bridging across the exposure was suture-ligated along with hemoclips and divided.  Dissection was then carried out thru an avascular plane leaving the sternocleidomastoid carotid artery and jugular vein laterally and the trachea and esophagus medially. The ventral aspect of the vertebral column was identified and a localizing x-ray was taken. The C4-5, C5-6, and C6-7 levels were identified. The annulus at each level was incised  and the disc space entered.  Anterior osteophytic overgrowth was removed using the osteophyte removal tool and high-speed drill.  Discectomy was performed with micro-curettes and pituitary rongeurs. The operating microscope was draped and brought into the field provided additional magnification illumination and visualization. Discectomy was continued posteriorly thru the disc space and then the cartilaginous endplate was removed using micro-curettes along with the high-speed drill. Posterior osteophytic overgrowth was removed at each level using the high-speed drill along with a 2 mm thin footplated Kerrison punch. Posterior longitudinal ligament along with disc herniation was carefully removed, decompressing the spinal canal and thecal sac. We then continued to remove osteophytic overgrowth and disc material decompressing the neural foramina and exiting nerve roots bilaterally at each level. Once the decompression was completed hemostasis was established at each level with the use of Gelfoam with thrombin. The Gelfoam was removed, a thin layer Surgifoam applied, the wound irrigated and hemostasis confirmed. We then measured the height of each intravertebral disc space level and selected a 6 millimeter in height structural allograft for the C4-5 level, a 6 millimeter in height structural allograft for the C5-6 level, and a 6 millimeter in height structural allograft for the C6-7 level . Each was hydrated in saline solution and then gently positioned in the intravertebral disc space and countersunk. We then selected a 55 millimeter in height Medtronic translational Atlantis cervical plate. It was positioned over the fusion construct and secured to the vertebra with 4 x 13 mm fixed screws. Each screw hole was started with the high-speed drill and then the screws placed, once all the screws were placed, the locking system was secured. The wound was irrigated with bacitracin solution checked for hemostasis which was  established and confirmed. An x-ray was taken which showed the grafts in good position, plate and screws in good position, and the overall alignment was  good. We then proceeded with closure. The platysma was closed with interrupted inverted 2-0 undyed Vicryl suture, the subcutaneous and subcuticular closed with interrupted inverted 3-0 undyed Vicryl suture. The skin edges were approximated with Dermabond. Following surgery the patient was taken out of cervical traction. To be reversed and the anesthetic and taken to the recovery room for further care.  PLAN OF CARE: Admit to inpatient   PATIENT DISPOSITION:  PACU - hemodynamically stable.   Delay start of Pharmacological VTE agent (>24hrs) due to surgical blood loss or risk of bleeding:  yes

## 2019-06-12 NOTE — Anesthesia Preprocedure Evaluation (Signed)
Anesthesia Evaluation  Patient identified by MRN, date of birth, ID band Patient awake    Reviewed: Allergy & Precautions, H&P , NPO status , Patient's Chart, lab work & pertinent test results  History of Anesthesia Complications (+) PONV and history of anesthetic complications  Airway Mallampati: II   Neck ROM: full    Dental   Pulmonary asthma ,    breath sounds clear to auscultation       Cardiovascular hypertension,  Rhythm:regular Rate:Normal     Neuro/Psych  Headaches, PSYCHIATRIC DISORDERS Depression    GI/Hepatic GERD  ,  Endo/Other  diabetes, Type 2  Renal/GU      Musculoskeletal   Abdominal   Peds  Hematology   Anesthesia Other Findings   Reproductive/Obstetrics                             Anesthesia Physical Anesthesia Plan  ASA: II  Anesthesia Plan: General   Post-op Pain Management:    Induction: Intravenous  PONV Risk Score and Plan: 4 or greater and Ondansetron, Dexamethasone, Midazolam, Treatment may vary due to age or medical condition and Propofol infusion  Airway Management Planned: Oral ETT  Additional Equipment:   Intra-op Plan:   Post-operative Plan: Extubation in OR  Informed Consent: I have reviewed the patients History and Physical, chart, labs and discussed the procedure including the risks, benefits and alternatives for the proposed anesthesia with the patient or authorized representative who has indicated his/her understanding and acceptance.       Plan Discussed with: CRNA, Anesthesiologist and Surgeon  Anesthesia Plan Comments:         Anesthesia Quick Evaluation

## 2019-06-12 NOTE — Progress Notes (Signed)
Vitals:   06/12/19 1424 06/12/19 1429 06/12/19 1430 06/12/19 1509  BP:   104/67 96/62  Pulse: (!) 108 (!) 104 (!) 103 91  Resp: 14 13 16 16   Temp:   97.9 F (36.6 C) 98 F (36.7 C)  TempSrc:    Oral  SpO2: 92% 95% 96% 92%  Weight:      Height:        CBC Recent Labs    06/10/19 0812  WBC 6.2  HGB 13.5  HCT 41.6  PLT 316   BMET Recent Labs    06/10/19 0812  NA 137  K 4.1  CL 104  CO2 23  GLUCOSE 135*  BUN 9  CREATININE 0.89  CALCIUM 9.1    Patient is resting comfortably in bed.  Has been up and ambulating in the halls.  Has voided, since Foley DC'd.  Wound clean and dry; no erythema, ecchymosis, swelling, or drainage.  Patient notes that the numbness and tingling in the digit of the right hand is already improved, but she has not yet noticed a lot of change in the left upper extremity.  Plan: Encouraged to ambulate in the halls.  Continue to progress through postoperative recovery.  Hosie Spangle, MD 06/12/2019, 6:28 PM

## 2019-06-13 ENCOUNTER — Encounter: Payer: Self-pay | Admitting: Medical

## 2019-06-13 ENCOUNTER — Encounter (HOSPITAL_COMMUNITY): Payer: Self-pay | Admitting: Neurosurgery

## 2019-06-13 MED ORDER — HYDROMORPHONE HCL 2 MG PO TABS: 1.0000 mg | ORAL_TABLET | ORAL | 0 refills | Status: DC | PRN

## 2019-06-13 NOTE — Anesthesia Postprocedure Evaluation (Signed)
Anesthesia Post Note  Patient: Hailey Wilkins  Procedure(s) Performed: ANTERIOR CERVICAL DECOMPRESSION/DISCECTOMY FUSION CERVICAL FOUR- CERVICAL FIVE, CERVICAL FIVE- CERVICAL SIX, CERVICAL SIX- CERVICAL SEVEN (N/A Spine Cervical)     Patient location during evaluation: PACU Anesthesia Type: General Level of consciousness: awake and alert Pain management: pain level controlled Vital Signs Assessment: post-procedure vital signs reviewed and stable Respiratory status: spontaneous breathing, nonlabored ventilation, respiratory function stable and patient connected to nasal cannula oxygen Cardiovascular status: blood pressure returned to baseline and stable Postop Assessment: no apparent nausea or vomiting Anesthetic complications: no    Last Vitals:  Vitals:   06/13/19 1255 06/13/19 1639  BP: 130/74 126/77  Pulse: (!) 101 79  Resp: 18 18  Temp: 36.8 C 36.7 C  SpO2: 97% 97%    Last Pain:  Vitals:   06/13/19 1639  TempSrc: Oral  PainSc:                  Christna Kulick S

## 2019-06-13 NOTE — Plan of Care (Signed)
Patient alert and oriented, mae's well, voiding adequate amount of urine, swallowing without difficulty, no c/o pain at time of discharge. Patient discharged home with family. Script and discharged instructions given to patient. Patient and family stated understanding of instructions given. Patient has an appointment with Dr. Nudelman 

## 2019-06-13 NOTE — Discharge Summary (Signed)
Physician Discharge Summary  Patient ID: Hailey Wilkins MRN: [id number] DOB/AGE: 1971/09/23 48 y.o.  Admit date: 06/12/2019 Discharge date: 06/13/2019  Admission Diagnoses:  Multilevel spondylitic cervical disc herniation, cervical spondylosis, cervical degenerative disease, cervical radiculopathy, left upper extremity weakness  Discharge Diagnoses:  Multilevel spondylitic cervical disc herniation, cervical spondylosis, cervical degenerative disease, cervical radiculopathy, left upper extremity weakness Active Problems:   HNP (herniated nucleus pulposus), cervical   Discharged Condition: good  Hospital Course: Patient was admitted, underwent a 3 level C4-5, C5-6, C6-7 ACDF with structural allograft and Medtronic translational Atlantis cervical plating.  She has done quite well following surgery.  She has had excellent relief of the bilateral cervical radiculopathy.  She does have a moderate amount of pain in the upper back as well as some mild discomfort in the chest.  She describes some mild dysphagia but overall has made good progress, ambulating regularly and she has been able to eat a soft diet.  Her incision is healing nicely.  We are discharging her to home with instructions regarding wound care and activities.  She is scheduled for follow-up with me in the office in 3 weeks.  Discharge Exam: Blood pressure 130/74, pulse (!) 101, temperature 98.2 F (36.8 C), temperature source Oral, resp. rate 18, height 5\' 2"  (1.575 m), weight 82.8 kg, last menstrual period 02/10/2018, SpO2 97 %.  Disposition: Discharge disposition: 01-Home or Self Care       Discharge Instructions    Discharge wound care:   Complete by: As directed    Leave the wound open to air. Shower daily with the wound uncovered. Water and soapy water should run over the incision area. Do not wash directly on the incision for 2 weeks. Remove the glue after 2 weeks.   Driving Restrictions   Complete by: As directed    No driving for 2 weeks. May ride in the car locally now. May begin to drive locally in 2 weeks.   Other Restrictions   Complete by: As directed    Walk gradually increasing distances out in the fresh air at least twice a day. Walking additional 6 times inside the house, gradually increasing distances, daily. No bending, lifting, or twisting. Perform activities between shoulder and waist height (that is at counter height when standing or table height when sitting).     Allergies as of 06/13/2019      Reactions   Amoxicillin Anaphylaxis   Ceftriaxone Anaphylaxis   Erythromycin Anaphylaxis   Levofloxacin Anaphylaxis   Rocephin [ceftriaxone Sodium In Dextrose] Anaphylaxis   Shellfish Allergy Anaphylaxis   Tetracyclines & Related Anaphylaxis   Amoxicillin-pot Clavulanate Hives   Clindamycin/lincomycin Hives   Codeine Hives   Latex    Sulfa Antibiotics Hives   Vancomycin Rash      Medication List    STOP taking these medications   cyclobenzaprine 5 MG tablet Commonly known as: FLEXERIL   predniSONE 10 MG (21) Tbpk tablet Commonly known as: STERAPRED UNI-PAK 21 TAB     TAKE these medications   cetirizine 10 MG tablet Commonly known as: ZYRTEC Take 10 mg by mouth daily.   etodolac 500 MG tablet Commonly known as: LODINE Take 1 tablet (500 mg total) by mouth 2 (two) times daily. With food   fluticasone 50 MCG/ACT nasal spray Commonly known as: FLONASE Place 2 sprays into both nostrils daily.   HYDROmorphone 2 MG tablet Commonly known as: DILAUDID Take 0.5-1 tablets (1-2 mg total) by mouth every 4 (four) hours as needed (  pain).   ipratropium 0.03 % nasal spray Commonly known as: ATROVENT Place 2 sprays into both nostrils 3 (three) times daily.   metoprolol succinate 50 MG 24 hr tablet Commonly known as: TOPROL-XL Take 1 tablet (50 mg total) by mouth daily. What changed: how much to take   MULTIVITAMIN ADULT PO Take 1 tablet by mouth daily.   PARoxetine 10 MG  tablet Commonly known as: PAXIL Take 10 mg by mouth daily.   valACYclovir 1000 MG tablet Commonly known as: VALTREX TAKE 1 DAILY DAILY AS DIRECTED What changed: See the new instructions.   Ventolin HFA 108 (90 Base) MCG/ACT inhaler Generic drug: albuterol INHALE 2 PUFFS INTO THE LUNGS EVERY 6 HOURS AS NEEDED FOR WHEEZING OR SHORTNESS OF BREATH. What changed: See the new instructions.            Discharge Care Instructions  (From admission, onward)         Start     Ordered   06/13/19 0000  Discharge wound care:    Comments: Leave the wound open to air. Shower daily with the wound uncovered. Water and soapy water should run over the incision area. Do not wash directly on the incision for 2 weeks. Remove the glue after 2 weeks.   06/13/19 1625           Signed: Hosie Spangle 06/13/2019, 4:26 PM

## 2019-06-13 NOTE — Discharge Instructions (Signed)
  Call Your Doctor If Any of These Occur Redness, drainage, or swelling at the wound.  Temperature greater than 101 degrees. Severe pain not relieved by pain medication. Increased difficulty swallowing. Incision starts to come apart. Follow Up Appt Call today for appointment in 3 weeks [phone]) or for problems.  If you have any hardware placed in your spine, you will need an x-ray before your appointment.   

## 2019-06-14 LAB — TYPE AND SCREEN
ABO/RH(D): A POS
Antibody Screen: NEGATIVE
Unit division: 0

## 2019-06-14 LAB — BPAM RBC
Blood Product Expiration Date: 202008152359
Unit Type and Rh: 6200

## 2019-07-01 DIAGNOSIS — Z981 Arthrodesis status: Secondary | ICD-10-CM | POA: Insufficient documentation

## 2019-07-01 DIAGNOSIS — M5020 Other cervical disc displacement, unspecified cervical region: Secondary | ICD-10-CM | POA: Diagnosis not present

## 2019-08-26 DIAGNOSIS — Z981 Arthrodesis status: Secondary | ICD-10-CM | POA: Diagnosis not present

## 2019-08-28 ENCOUNTER — Ambulatory Visit: Payer: BC Managed Care – PPO

## 2019-08-28 ENCOUNTER — Other Ambulatory Visit: Payer: Self-pay

## 2019-08-28 DIAGNOSIS — Z23 Encounter for immunization: Secondary | ICD-10-CM

## 2019-09-02 ENCOUNTER — Ambulatory Visit
Admission: RE | Admit: 2019-09-02 | Discharge: 2019-09-02 | Disposition: A | Discharge status: Home / Self Care | Payer: BC Managed Care – PPO | Source: Ambulatory Visit | Attending: Obstetrics & Gynecology | Admitting: Obstetrics & Gynecology

## 2019-09-02 DIAGNOSIS — R921 Mammographic calcification found on diagnostic imaging of breast: Secondary | ICD-10-CM | POA: Diagnosis not present

## 2019-09-12 DIAGNOSIS — D2271 Melanocytic nevi of right lower limb, including hip: Secondary | ICD-10-CM | POA: Diagnosis not present

## 2019-09-12 DIAGNOSIS — D2262 Melanocytic nevi of left upper limb, including shoulder: Secondary | ICD-10-CM | POA: Diagnosis not present

## 2019-09-12 DIAGNOSIS — D225 Melanocytic nevi of trunk: Secondary | ICD-10-CM | POA: Diagnosis not present

## 2019-09-12 DIAGNOSIS — D2261 Melanocytic nevi of right upper limb, including shoulder: Secondary | ICD-10-CM | POA: Diagnosis not present

## 2019-09-13 IMAGING — MG MM DIGITAL SCREENING BILAT W/ TOMO W/ CAD
6 of 11 series · 6 of 31 positions shown · non-contrast
Comparison: Previous exam(s).

CLINICAL DATA: Screening. History of bilateral reduction
mammoplasty in 0214.

EXAM:
DIGITAL SCREENING BILATERAL MAMMOGRAM WITH TOMO AND CAD

[L MLO synth-2D (1 of 2)]
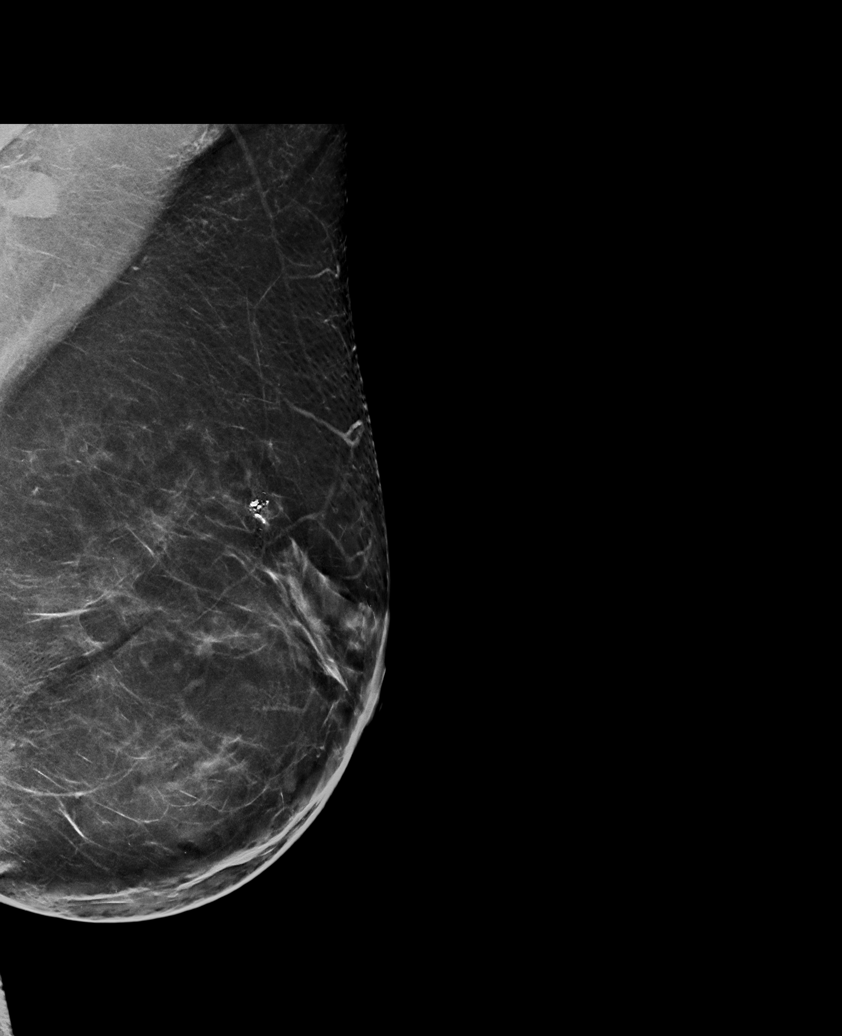

[L MLO synth-2D (2 of 2)]
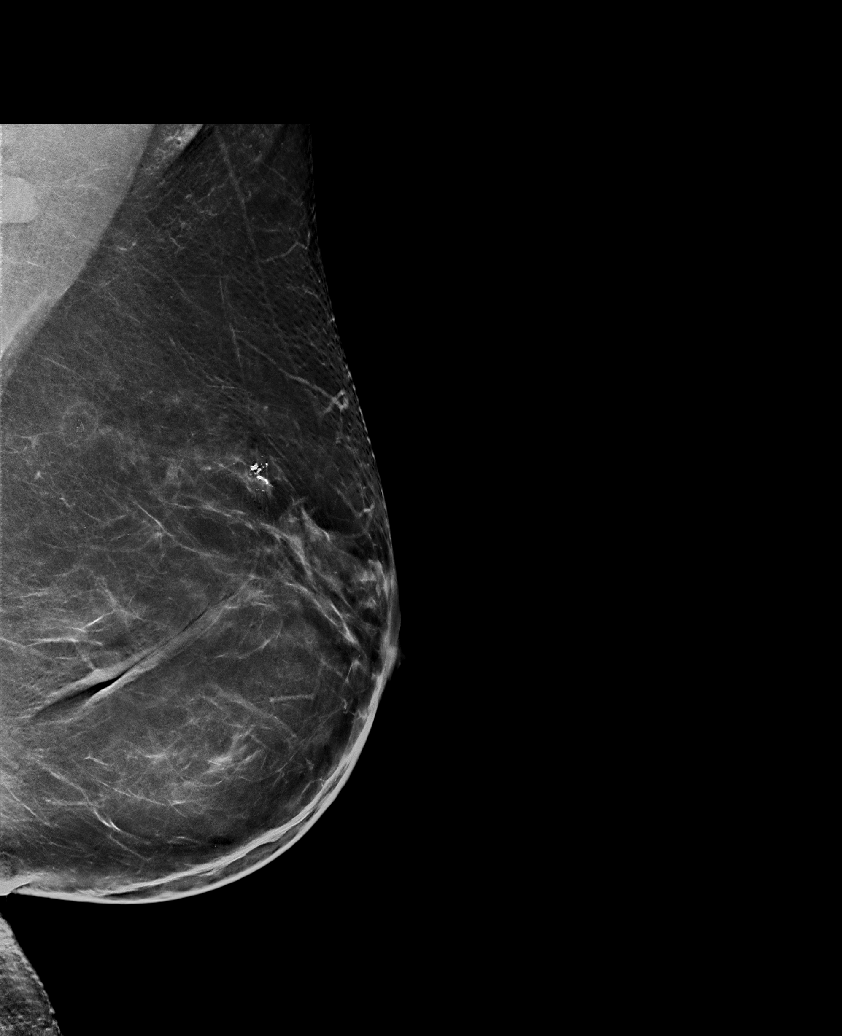

[R MLO synth-2D]
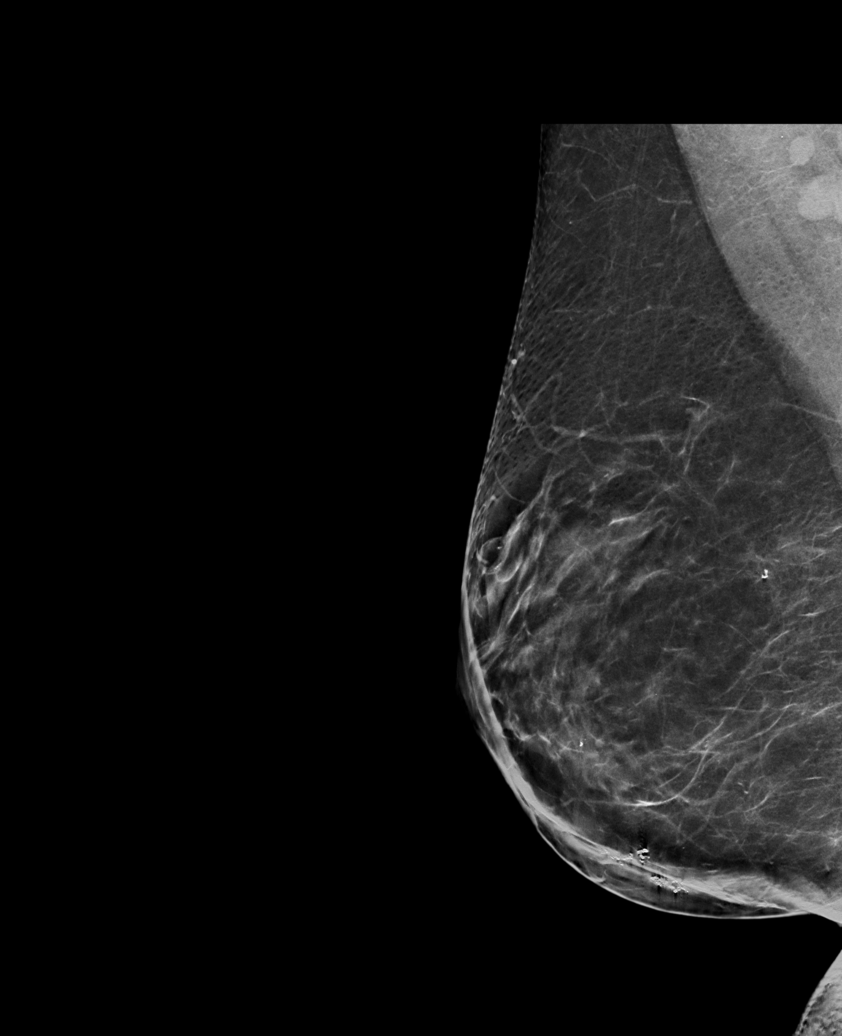

[R CC synth-2D]
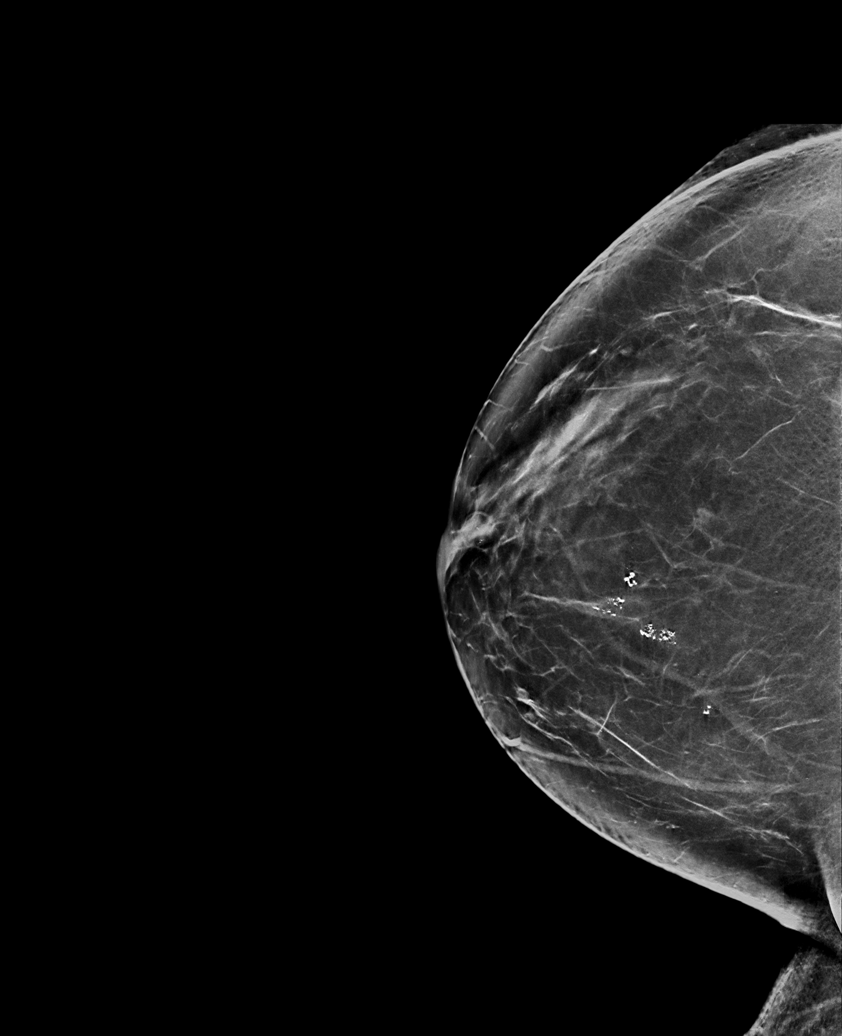

[L CC synth-2D]
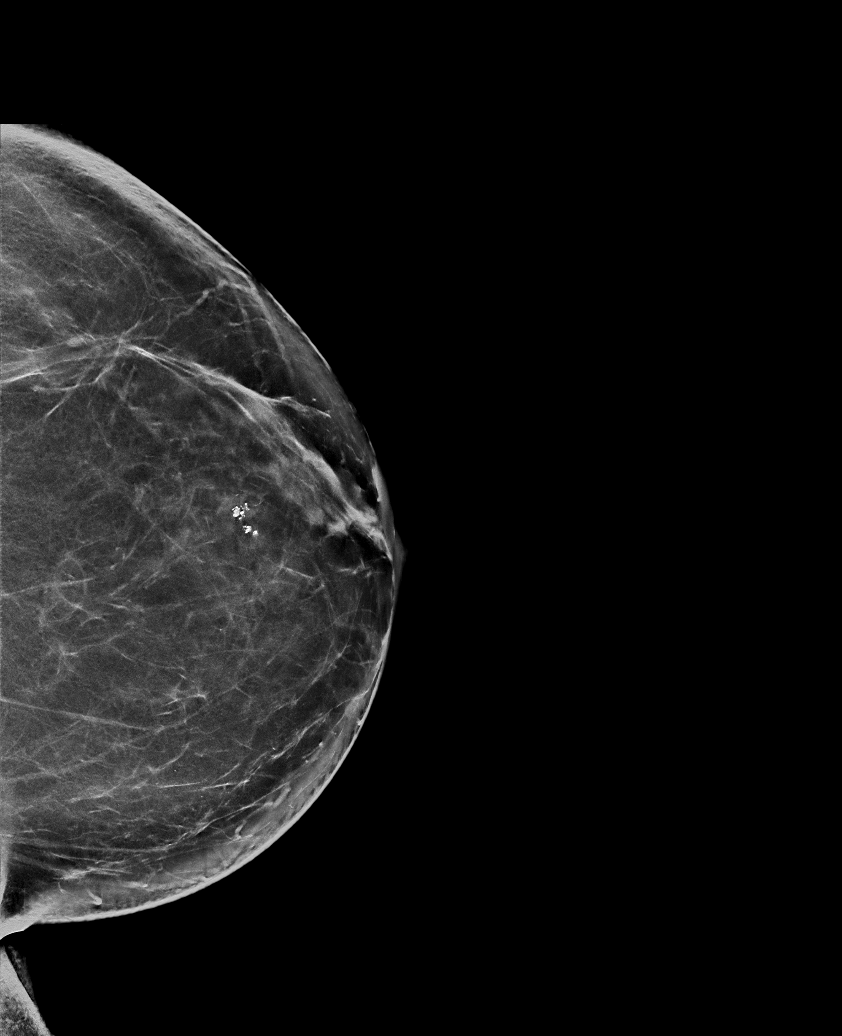

[R CC]
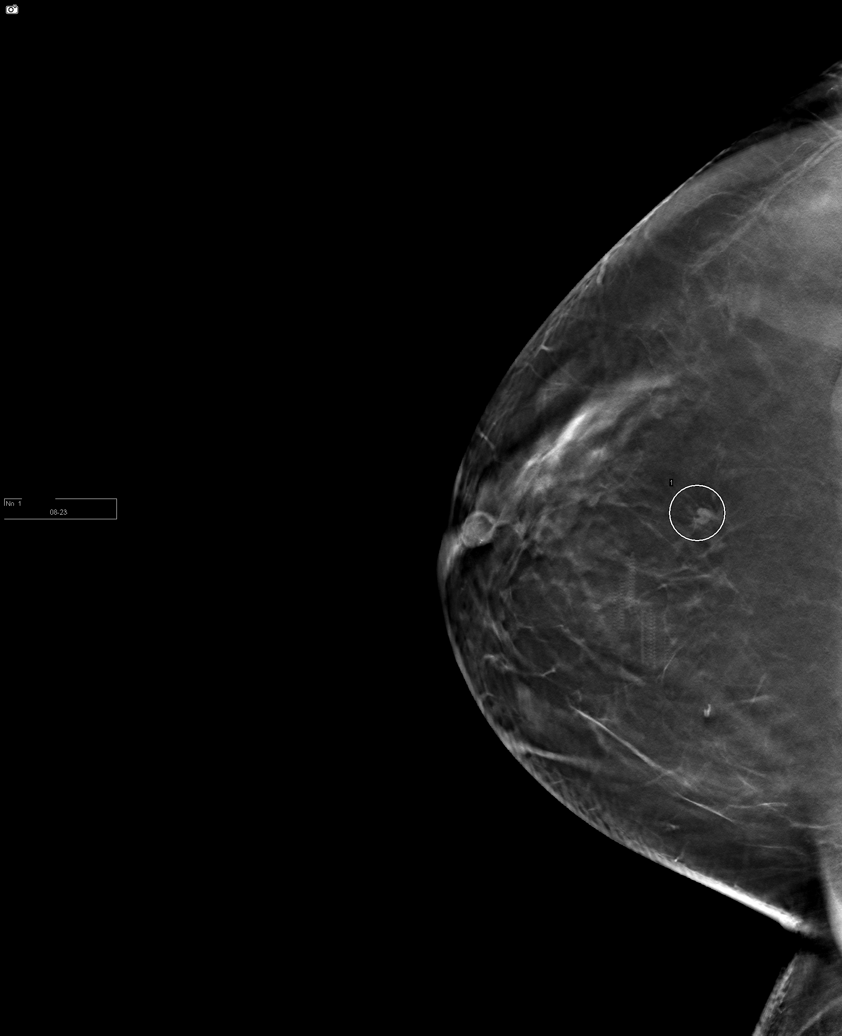

[6 of 31 positions shown; findings below may reference images not displayed]

ACR Breast Density Category b: There are scattered areas of
fibroglandular density.
FINDINGS: In the right breast , calcifications within the lower inner quadrant
of the RIGHT breast and a possible mass within the upper RIGHT
breast require further evaluation. The possible mass within the
RIGHT breast is seen on tomosynthesis CC slice 51 and MLO slice 41.

In the left breast, calcifications within the upper LEFT breast
require further evaluation.

Images were processed with CAD.
IMPRESSION: Further evaluation is suggested for possible mass in the upper right
breast and for RIGHT breast calcifications. The RIGHT breast
calcifications are suspected to be fat necrosis calcifications
related to history of reduction mammoplasty.

Further evaluation is suggested for calcifications in the left
breast. These calcifications are also suspected to be fat necrosis
calcifications related to history of reduction mammoplasty.

RECOMMENDATION:
Diagnostic mammogram and possibly ultrasound of both breasts.
(Code:84-2-XXD)

The patient will be contacted regarding the findings, and additional
imaging will be scheduled.

BI-RADS CATEGORY  0: Incomplete. Need additional imaging evaluation
and/or prior mammograms for comparison.

## 2019-10-24 NOTE — Progress Notes (Signed)
48 y.o. G2P2 Married White or Caucasian female here for annual exam.  Doing well.  Had acute onset of back pain.  Massage, x rays and medication treatment did not help at all.  She had three ruptured discs and she saw Dr. Rita Ohara.  She has done really well since surgery.  She's not fully released.    She does want some blood work today.  Had blood sugar level that was elevated 05/2019 prior her surgery.    Tyrer Cusick model recalculated today today after having genetic testing and is 17.5% lifetime risk.  Limited breast MRI discussed with pt today.   Patient's last menstrual period was 02/10/2018.          Sexually active: Yes.    The current method of family planning is none.    Exercising: No.  The patient does not participate in regular exercise at present. Smoker:  no  Health Maintenance: Pap:  07/16/18 Normal   02/11/16 Neg. HR HPV:neg              12/02/13 Neg  History of abnormal Pap:  Yes remote history MMG:  09/02/19 Colonoscopy:  2006 BMD:   never TDaP:  07/16/2018 Pneumonia vaccine(s):  2008 Shingrix:   Not done Hep C testing: NA Screening Labs: Would like to do labs here    reports that she has never smoked. She has never used smokeless tobacco. She reports current alcohol use of about 5.0 standard drinks of alcohol per week. She reports that she does not use drugs.  Past Medical History:  Diagnosis Date  . Allergic rhinitis    Dr. Doreene Nest, s/p allergy testing  . Anemia   . Asthma   . Basal cell carcinoma of anterior chest    removed  . Breast cyst 2011   bx benign  . Complication of anesthesia   . Depression    after father died, on [place] in past  . Family history of breast cancer   . Family history of pancreatic cancer   . Family history of stomach cancer   . GERD (gastroesophageal reflux disease)   . Gestational diabetes    with first child  . Headache(784.0)   . History of chicken pox   . Hypertension   . Inappropriate sinus node tachycardia   .  Internal hemorrhoid 2006   dx during colon w/Dr Tiffany Kocher  . Migraines    associated with menses in past, tx with excedrin, may last up to 1 week  . Migraines   . Pericarditis    a. 10/2012 ETT: Ex time 9:00, No ST/T changes; b. 10/2012 Echo: EF 55-60%, no rwma, no pericardial effusion.  Marland Kitchen PONV (postoperative nausea and vomiting)    surgery in 2017 at [facility], she had no n/v  . Shingles 04/2011  . UTI (lower urinary tract infection)     Past Surgical History:  Procedure Laterality Date  . ANTERIOR CERVICAL DECOMP/DISCECTOMY FUSION N/A 06/12/2019   Procedure: ANTERIOR CERVICAL DECOMPRESSION/DISCECTOMY FUSION CERVICAL FOUR- CERVICAL FIVE, CERVICAL FIVE- CERVICAL SIX, CERVICAL SIX- CERVICAL SEVEN;  Surgeon: Jovita Gamma, MD;  Location: [facility];  Service: Neurosurgery;  Laterality: N/A;  ANTERIOR CERVICAL DECOMPRESSION/DISCECTOMY FUSION CERVICAL 4- CERVICAL 5, CERVICAL 5- CERVICAL 6, CERVICAL 6- CERVICAL7  . BASAL CELL CARCINOMA EXCISION  2011   removed from chest/[facility]  . BREAST BIOPSY Right 07/25/2018   stereo bx coil clip- CYSTIC PAPILLARY APOCRINE METAPLASIA  . CHOLECYSTECTOMY  2010   [facility]  Marland Kitchen COLONOSCOPY    .  KNEE ARTHROSCOPY W/ MENISCAL REPAIR  1989   Right, Dr. Mauri Pole  . NASAL SINUS SURGERY  1999, 2005  . REDUCTION MAMMAPLASTY  2017  . TONSILLECTOMY  1988    Current Outpatient Medications  Medication Sig Dispense Refill  . cetirizine (ZYRTEC) 10 MG tablet Take 10 mg by mouth daily.    Marland Kitchen etodolac (LODINE) 500 MG tablet Take 1 tablet (500 mg total) by mouth 2 (two) times daily. With food 20 tablet 3  . fluticasone (FLONASE) 50 MCG/ACT nasal spray Place 2 sprays into both nostrils daily.     Marland Kitchen HYDROmorphone (DILAUDID) 2 MG tablet Take 0.5-1 tablets (1-2 mg total) by mouth every 4 (four) hours as needed (pain). 30 tablet 0  . ipratropium (ATROVENT) 0.03 % nasal spray Place 2 sprays into both nostrils 3 (three) times daily. 30 mL 1  .  metoprolol succinate (TOPROL-XL) 50 MG 24 hr tablet Take 1 tablet (50 mg total) by mouth daily. (Patient taking differently: Take 25 mg by mouth daily. ) 90 tablet 0  . Multiple Vitamins-Minerals (MULTIVITAMIN ADULT PO) Take 1 tablet by mouth daily.    Marland Kitchen PARoxetine (PAXIL) 10 MG tablet Take 10 mg by mouth daily.     . valACYclovir (VALTREX) 1000 MG tablet TAKE 1 DAILY DAILY AS DIRECTED (Patient taking differently: Take 1,000 mg by mouth daily as needed (cold sore). ) 90 tablet 1  . VENTOLIN HFA 108 (90 Base) MCG/ACT inhaler INHALE 2 PUFFS INTO THE LUNGS EVERY 6 HOURS AS NEEDED FOR WHEEZING OR SHORTNESS OF BREATH. (Patient taking differently: Inhale 2 puffs into the lungs every 6 (six) hours as needed for wheezing or shortness of breath. ) 18 Inhaler 0   No current facility-administered medications for this visit.     Family History  Problem Relation Age of Onset  . Hypertension Mother   . Cancer Mother 49       Breast Ca  . Arthritis Mother   . Breast cancer Mother 27       2nd primary  . Cancer Father 61       Pancreatic  . Diabetes Father   . Hypertension Father   . Hyperlipidemia Father   . Heart disease Father   . Cancer Sister 31       Thyroid  . Cancer Maternal Grandmother        might have had cancer, died at 35  . Alcohol abuse Maternal Grandfather   . Pancreatic cancer Maternal Grandfather 88       died in his 34's  . Alcohol abuse Paternal Grandfather   . Heart disease Paternal Grandfather   . Hyperlipidemia Paternal Grandfather   . Hypertension Paternal Grandfather   . Other Paternal Grandmother        brain tumor on pitutary gland  . Brain cancer Paternal Grandmother 23       pituitary tumor, died in her 72's  . Breast cancer Cousin 63       maternal couisn, is now in her 52's  . Stomach cancer Maternal Aunt 75       is now 48  . Stomach cancer Maternal Aunt 75       died in 7's  . Breast cancer Cousin 37       maternal cousin, is now 82    Review of  Systems  All other systems reviewed and are negative.   Exam:   Vitals:   11/01/19 1025  BP: 122/64  Pulse: 81  Temp: 97.6  F (36.4 C)  SpO2: 97%    General appearance: alert, cooperative and appears stated age Head: Normocephalic, without obvious abnormality, atraumatic Neck: no adenopathy, supple, symmetrical, trachea midline and thyroid normal to inspection and palpation Lungs: clear to auscultation bilaterally Breasts: normal appearance, no masses or tenderness Heart: regular rate and rhythm Abdomen: soft, non-tender; bowel sounds normal; no masses,  no organomegaly Extremities: extremities normal, atraumatic, no cyanosis or edema Skin: Skin color, texture, turgor normal. No rashes or lesions Lymph nodes: Cervical, supraclavicular, and axillary nodes normal. No abnormal inguinal nodes palpated Neurologic: Grossly normal   Pelvic: External genitalia:  no lesions              Urethra:  normal appearing urethra with no masses, tenderness or lesions              Bartholins and Skenes: normal                 Vagina: normal appearing vagina with normal color and discharge, no lesions              Cervix: no lesions              Pap taken: No. Bimanual Exam:  Uterus:  normal size, contour, position, consistency, mobility, non-tender              Adnexa: normal adnexa and no mass, fullness, tenderness               Rectovaginal: Confirms               Anus:  normal sphincter tone, no lesions  Chaperone was present for exam.  A:  Well Woman with normal exam PMP, no HRT H/o anxiety/depression Oral HSV Family hx of breast cancer with her mother who has two separate cancers  P:   Mammogram guidelines reviewed.  Limited breast MRI discussed.  Information given. pap smear neg 2019 and neg HR HPV 2017.  New guidelines reviewed.  Not indicated today. Will will call when needs RF for Valtrex Fx for Paxil 10mg  1 1/2 tabs daily to pharamcy.  #90 day supply/4RF HbA1C will be  obtained today Colonoscopy referral will be placed after her birthday return annually or prn   She will call if/when needs RF for Valtrex

## 2019-10-30 ENCOUNTER — Other Ambulatory Visit: Payer: Self-pay

## 2019-11-01 ENCOUNTER — Other Ambulatory Visit: Payer: Self-pay

## 2019-11-01 ENCOUNTER — Ambulatory Visit: Payer: BC Managed Care – PPO | Admitting: Obstetrics & Gynecology

## 2019-11-01 ENCOUNTER — Encounter: Payer: Self-pay | Admitting: Obstetrics & Gynecology

## 2019-11-01 VITALS — BP 122/64 | HR 81 | Temp 97.6°F | Ht 62.25 in | Wt 181.0 lb

## 2019-11-01 DIAGNOSIS — Z01419 Encounter for gynecological examination (general) (routine) without abnormal findings: Secondary | ICD-10-CM | POA: Diagnosis not present

## 2019-11-01 DIAGNOSIS — R7309 Other abnormal glucose: Secondary | ICD-10-CM

## 2019-11-01 MED ORDER — PAROXETINE HCL 10 MG PO TABS: ORAL_TABLET | ORAL | 4 refills | Status: DC

## 2019-11-02 LAB — HEMOGLOBIN A1C
Est. average glucose Bld gHb Est-mCnc: 120 mg/dL
Hgb A1c MFr Bld: 5.8 % — ABNORMAL HIGH (ref 4.8–5.6)

## 2019-11-03 ENCOUNTER — Encounter: Payer: Self-pay | Admitting: Medical

## 2019-11-03 ENCOUNTER — Encounter: Payer: Self-pay | Admitting: Obstetrics & Gynecology

## 2019-11-04 ENCOUNTER — Telehealth: Payer: Self-pay | Admitting: Medical

## 2019-11-04 ENCOUNTER — Other Ambulatory Visit: Payer: Self-pay | Admitting: Medical

## 2019-11-04 DIAGNOSIS — E559 Vitamin D deficiency, unspecified: Secondary | ICD-10-CM

## 2019-11-04 DIAGNOSIS — R7309 Other abnormal glucose: Secondary | ICD-10-CM

## 2019-11-04 DIAGNOSIS — Z0189 Encounter for other specified special examinations: Secondary | ICD-10-CM

## 2019-11-04 NOTE — Telephone Encounter (Signed)
I returned Novi's phone call she was concerned about her  A1C 5.8, at her OB/GYN appointment. Her PCP has left the practice at [facility] , so she needs to reestablish a new PCP.  She had gestational diabetes, and she has father , uncle and aunt all with diabetes.  I will order some lab work, counsel her and possibly schedule her with Maggie May the dietitian.  She is happy with this plan. We will schedule her after the Christmas holiday. I did discuss the glycemic index with patient in hopes of patient making good choices over the holiday. She verbalizes understanding and has no questions at the end of our conversation.

## 2019-11-26 ENCOUNTER — Other Ambulatory Visit: Payer: BC Managed Care – PPO

## 2019-11-26 ENCOUNTER — Other Ambulatory Visit: Payer: Self-pay | Admitting: Obstetrics & Gynecology

## 2019-12-02 ENCOUNTER — Ambulatory Visit: Payer: BC Managed Care – PPO | Admitting: Medical

## 2019-12-28 ENCOUNTER — Other Ambulatory Visit: Payer: Self-pay | Admitting: Obstetrics & Gynecology

## 2020-01-27 ENCOUNTER — Other Ambulatory Visit: Payer: Self-pay | Admitting: Obstetrics & Gynecology

## 2020-02-10 ENCOUNTER — Other Ambulatory Visit: Payer: Self-pay

## 2020-02-10 ENCOUNTER — Ambulatory Visit: Payer: Self-pay | Attending: Internal Medicine

## 2020-02-10 DIAGNOSIS — Z23 Encounter for immunization: Secondary | ICD-10-CM

## 2020-02-10 NOTE — Progress Notes (Signed)
   Covid-19 Vaccination Clinic  Name:  Hailey Wilkins    MRN: [id number] DOB: 1971-03-29  02/10/2020  [name] was observed post Covid-19 immunization for 30 minutes based on pre-vaccination screening without incident. She was provided with Vaccine Information Sheet and instruction to access the V-Safe system.   [name] was instructed to call 911 with any severe reactions post vaccine: Marland Kitchen Difficulty breathing  . Swelling of face and throat  . A fast heartbeat  . A bad rash all over body  . Dizziness and weakness   Immunizations Administered    Name Date Dose VIS Date Route   Pfizer COVID-19 Vaccine 02/10/2020  4:41 PM 0.3 mL 11/01/2019 Intramuscular   Manufacturer: [place]   Lot: [id number]   [place]: [id number]

## 2020-02-26 ENCOUNTER — Other Ambulatory Visit: Payer: Self-pay | Admitting: Obstetrics & Gynecology

## 2020-03-02 ENCOUNTER — Ambulatory Visit: Payer: Self-pay | Attending: Internal Medicine

## 2020-03-02 DIAGNOSIS — Z23 Encounter for immunization: Secondary | ICD-10-CM

## 2020-03-02 NOTE — Progress Notes (Signed)
   Covid-19 Vaccination Clinic  Name:  [name]    MRN: [id number] DOB: 02-Apr-1971  03/02/2020  [name] was observed post Covid-19 immunization for 30 minutes based on pre-vaccination screening without incident. She was provided with Vaccine Information Sheet and instruction to access the V-Safe system.   [name] was instructed to call 911 with any severe reactions post vaccine: Marland Kitchen Difficulty breathing  . Swelling of face and throat  . A fast heartbeat  . A bad rash all over body  . Dizziness and weakness   Immunizations Administered    Name Date Dose VIS Date Route   Pfizer COVID-19 Vaccine 03/02/2020  4:42 PM 0.3 mL 11/01/2019 Intramuscular   Manufacturer: [place]   Lot: [id number]   [place]: [id number]

## 2020-03-19 ENCOUNTER — Other Ambulatory Visit: Payer: Self-pay

## 2020-03-19 ENCOUNTER — Encounter: Payer: Self-pay | Admitting: Nurse Practitioner

## 2020-03-19 ENCOUNTER — Ambulatory Visit (INDEPENDENT_AMBULATORY_CARE_PROVIDER_SITE_OTHER): Payer: BC Managed Care – PPO | Admitting: Nurse Practitioner

## 2020-03-19 VITALS — BP 120/82 | HR 69 | Ht 62.0 in | Wt 176.1 lb

## 2020-03-19 DIAGNOSIS — R0781 Pleurodynia: Secondary | ICD-10-CM

## 2020-03-19 DIAGNOSIS — R000 Tachycardia, unspecified: Secondary | ICD-10-CM | POA: Diagnosis not present

## 2020-03-19 MED ORDER — METOPROLOL SUCCINATE ER 50 MG PO TB24: 25.0000 mg | ORAL_TABLET | Freq: Every day | ORAL | 3 refills | Status: DC

## 2020-03-19 NOTE — Patient Instructions (Signed)
Medication Instructions:  1- Decrease Metoprolol to 1/2 tablet (25 mg total) once daily *If you need a refill on your cardiac medications before your next appointment, please call your pharmacy*   Lab Work: None ordered  If you have labs (blood work) drawn today and your tests are completely normal, you will receive your results only by: Marland Kitchen MyChart Message (if you have MyChart) OR . A paper copy in the mail If you have any lab test that is abnormal or we need to change your treatment, we will call you to review the results.   Testing/Procedures: None ordered    Follow-Up: At [facility], you and your health needs are our priority.  As part of our continuing mission to provide you with exceptional heart care, we have created designated Provider Care Teams.  These Care Teams include your primary Cardiologist (physician) and Advanced Practice Providers (APPs -  Physician Assistants and Nurse Practitioners) who all work together to provide you with the care you need, when you need it.  We recommend signing up for the patient portal called "MyChart".  Sign up information is provided on this After Visit Summary.  MyChart is used to connect with patients for Virtual Visits (Telemedicine).  Patients are able to view lab/test results, encounter notes, upcoming appointments, etc.  Non-urgent messages can be sent to your provider as well.   To learn more about what you can do with MyChart, go to [email or web address].    Your next appointment:   12 month(s)  The format for your next appointment:   In Person  Provider:    You may see Kathlyn Sacramento, MD or Murray Hodgkins, NP

## 2020-03-19 NOTE — Progress Notes (Signed)
Office Visit    Patient Name: Hailey Wilkins Date of Encounter: 03/19/2020  Primary Care Provider:  Adrian Prows, MD Primary Cardiologist:  Kathlyn Sacramento, MD  Chief Complaint    49 y/o ? with a history of inappropriate sinus tachycardia, pleuritic chest pain/?  Pericarditis in 2013, migraines, anemia, asthma, and depression, who presents for follow-up of tachycardia.  Past Medical History    Past Medical History:  Diagnosis Date   Allergic rhinitis    Dr. Doreene Nest, s/p allergy testing   Anemia    Asthma    Basal cell carcinoma of anterior chest    removed   Breast cyst 2011   bx benign   Complication of anesthesia    Depression    after father died, on [place] in past   Family history of breast cancer    Family history of pancreatic cancer    Family history of stomach cancer    GERD (gastroesophageal reflux disease)    Gestational diabetes    with first child   Headache(784.0)    History of chicken pox    Hypertension    Inappropriate sinus node tachycardia    Internal hemorrhoid 2006   dx during colon w/Dr Tiffany Kocher   Migraines    associated with menses in past, tx with excedrin, may last up to 1 week   Migraines    Pericarditis    a. 10/2012 ETT: Ex time 9:00, No ST/T changes; b. 10/2012 Echo: EF 55-60%, no rwma, no pericardial effusion.   PONV (postoperative nausea and vomiting)    surgery in 2017 at [facility], she had no n/v   Shingles 04/2011   UTI (lower urinary tract infection)    Past Surgical History:  Procedure Laterality Date   ANTERIOR CERVICAL DECOMP/DISCECTOMY FUSION N/A 06/12/2019   Procedure: ANTERIOR CERVICAL DECOMPRESSION/DISCECTOMY FUSION CERVICAL FOUR- CERVICAL FIVE, CERVICAL FIVE- CERVICAL SIX, CERVICAL SIX- CERVICAL SEVEN;  Surgeon: Jovita Gamma, MD;  Location: [facility];  Service: Neurosurgery;  Laterality: N/A;  ANTERIOR CERVICAL DECOMPRESSION/DISCECTOMY FUSION CERVICAL 4- CERVICAL 5, CERVICAL 5-  CERVICAL 6, CERVICAL 6- CERVICAL7   BASAL CELL CARCINOMA EXCISION  2011   removed from chest/[facility]   BREAST BIOPSY Right 07/25/2018   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   COLONOSCOPY     KNEE ARTHROSCOPY W/ MENISCAL REPAIR  1989   Right, Dr. Mauri Pole   NASAL SINUS SURGERY  1999, 2005   REDUCTION MAMMAPLASTY  2017   TONSILLECTOMY  1988    Allergies  Allergies  Allergen Reactions   Amoxicillin Anaphylaxis   Ceftriaxone Anaphylaxis   Erythromycin Anaphylaxis   Levofloxacin Anaphylaxis   Rocephin [Ceftriaxone Sodium In Dextrose] Anaphylaxis   Shellfish Allergy Anaphylaxis   Tetracyclines & Related Anaphylaxis   Amoxicillin-Pot Clavulanate Hives   Clindamycin/Lincomycin Hives   Codeine Hives   Latex    Sulfa Antibiotics Hives   Vancomycin Rash    History of Present Illness    49 y/o ? with the above past medical history including pleuritic chest pain and dyspnea on exertion with evaluation in 2013 including normal exercise treadmill testing and echocardiogram with an EF of 55-60%, no regional wall motion abnormalities, and no pericardial effusion.  There was question of pericarditis at the time.  Other history includes inappropriate sinus tachycardia managed with oral beta-blocker therapy, GERD, migraines, anemia, asthma, and depression.  She was last seen in cardiology clinic in August 2019,  at which time she was doing well.  Since her last visit she has done exceptionally well from a cardiac standpoint.  She required anterior cervical decompression in July 2020 and got through surgery well.  She has not been having any residual neck pain.  She was able to reduce her beta-blocker dose from 50 mg daily to just half a tablet (25 mg) daily and has not been experiencing any significant tachycardia with one exception.  Following her second COVID-19 vaccine injection earlier this  month, she did have 2 days of profound fatigue and myalgias and did note higher heart rates but this has since resolved.  She is walking regularly without chest pain or dyspnea.  She denies PND, orthopnea, dizziness, syncope, edema, or early satiety.  Home Medications    Prior to Admission medications   Medication Sig Start Date End Date Taking? Authorizing Provider  cetirizine (ZYRTEC) 10 MG tablet Take 10 mg by mouth daily.    [provider]  fluticasone (FLONASE) 50 MCG/ACT nasal spray Place 2 sprays into both nostrils daily.     [provider]  ipratropium (ATROVENT) 0.03 % nasal spray Place 2 sprays into both nostrils 3 (three) times daily. 11/30/18 06/12/19  Betancourt, Aura Fey, NP  metoprolol succinate (TOPROL-XL) 50 MG 24 hr tablet Take 1 tablet (50 mg total) by mouth daily. Patient taking differently: Take 25 mg by mouth daily.  05/27/19   Theora Gianotti, NP  Multiple Vitamins-Minerals (MULTIVITAMIN ADULT PO) Take 1 tablet by mouth daily.    [provider]  PARoxetine (PAXIL) 10 MG tablet 1 1/2 tablets daily. 11/01/19   Megan Salon, MD  valACYclovir (VALTREX) 1000 MG tablet TAKE 1 DAILY DAILY AS DIRECTED Patient taking differently: Take 1,000 mg by mouth daily as needed (cold sore).  11/02/18   Megan Salon, MD  VENTOLIN HFA 108 (90 Base) MCG/ACT inhaler INHALE 2 PUFFS INTO THE LUNGS EVERY 6 HOURS AS NEEDED FOR WHEEZING OR SHORTNESS OF BREATH. Patient taking differently: Inhale 2 puffs into the lungs every 6 (six) hours as needed for wheezing or shortness of breath.  12/22/18   Betancourt, Aura Fey, NP    Review of Systems    Overall doing well since her last cardiology visit.  She denies chest pain, palpitations, dyspnea, pnd, orthopnea, n, v, dizziness, syncope, edema, weight gain, or early satiety.  All other systems reviewed and are otherwise negative except as noted above.  Physical Exam    VS:  BP 120/82 (BP Location: Left Arm, Patient  Position: Sitting, Cuff Size: Normal)    Pulse 69    Ht 5\' 2"  (1.575 m)    Wt 176 lb 2 oz (79.9 kg)    LMP 02/10/2018    SpO2 96%    BMI 32.21 kg/m  , BMI Body mass index is 32.21 kg/m. GEN: Well nourished, well developed, in no acute distress. HEENT: normal. Neck: Supple, no JVD, carotid bruits, or masses. Cardiac: RRR, no murmurs, rubs, or gallops. No clubbing, cyanosis, edema.  Radials/DP/PT 2+ and equal bilaterally.  Respiratory:  Respirations regular and unlabored, clear to auscultation bilaterally. GI: Soft, nontender, nondistended, BS + x 4. MS: no deformity or atrophy. Skin: warm and dry, no rash. Neuro:  Strength and sensation are intact. Psych: Normal affect.  Accessory Clinical Findings    ECG personally reviewed by me today -  regular sinus rhythm, 69 - no acute changes.  Lab Results  Component Value Date   WBC 6.2 06/10/2019  HGB 13.5 06/10/2019   HCT 41.6 06/10/2019   MCV 87.9 06/10/2019   PLT 316 06/10/2019   Lab Results  Component Value Date   CREATININE 0.89 06/10/2019   BUN 9 06/10/2019   NA 137 06/10/2019   K 4.1 06/10/2019   CL 104 06/10/2019   CO2 23 06/10/2019   [facility]  05/2018 Total cholesterol 172, triglycerides 84, HDL 57.2, LDL 98 TSH 2.179  Assessment & Plan    1.  Inappropriate sinus tachycardia: Symptoms markedly improved over the past 2 years.  She is now taking only 25 mg of Toprol-XL daily.  Will refill today.  2.  History of pleuritic chest pain/pericarditis: This was in 2013 at which time, treadmill testing was negative and echo showed normal LV function without evidence of a pericardial effusion.  She has had no recurrence of pleuritic chest pain and is walking regularly without symptoms or limitations.  3.  Disposition: Follow-up in 1 year.   Murray Hodgkins, NP 03/19/2020, 8:36 AM

## 2020-04-06 ENCOUNTER — Encounter: Payer: Self-pay | Admitting: Obstetrics & Gynecology

## 2020-04-06 ENCOUNTER — Other Ambulatory Visit: Payer: Self-pay | Admitting: Obstetrics & Gynecology

## 2020-04-06 DIAGNOSIS — Z1211 Encounter for screening for malignant neoplasm of colon: Secondary | ICD-10-CM

## 2020-04-08 ENCOUNTER — Encounter: Payer: Self-pay | Admitting: Internal Medicine

## 2020-04-26 ENCOUNTER — Encounter: Payer: Self-pay | Admitting: Obstetrics & Gynecology

## 2020-05-27 ENCOUNTER — Encounter: Payer: BC Managed Care – PPO | Admitting: Internal Medicine

## 2020-06-19 ENCOUNTER — Other Ambulatory Visit: Admission: RE | Admit: 2020-06-19 | Payer: BC Managed Care – PPO | Source: Ambulatory Visit

## 2020-06-23 ENCOUNTER — Encounter: Admission: RE | Payer: Self-pay | Source: Home / Self Care

## 2020-06-23 ENCOUNTER — Ambulatory Visit: Admission: RE | Admit: 2020-06-23 | Payer: BC Managed Care – PPO | Source: Home / Self Care

## 2020-06-23 SURGERY — COLONOSCOPY
Anesthesia: General

## 2020-07-07 ENCOUNTER — Other Ambulatory Visit: Payer: Self-pay | Admitting: Obstetrics & Gynecology

## 2020-07-07 ENCOUNTER — Encounter: Payer: Self-pay | Admitting: Obstetrics & Gynecology

## 2020-07-07 MED ORDER — VALACYCLOVIR HCL 1 G PO TABS: ORAL_TABLET | ORAL | 1 refills | Status: DC

## 2020-08-12 ENCOUNTER — Telehealth: Payer: Self-pay | Admitting: Nurse Practitioner

## 2020-08-12 ENCOUNTER — Other Ambulatory Visit: Payer: Self-pay

## 2020-08-12 IMAGING — CR CERVICAL SPINE - 2-3 VIEW
3 series · 3 of 3 positions shown · non-contrast
Comparison: None.

CLINICAL DATA: Cervical decompression

EXAM:
CERVICAL SPINE - 2-3 VIEW

[AP (1 of 3)]
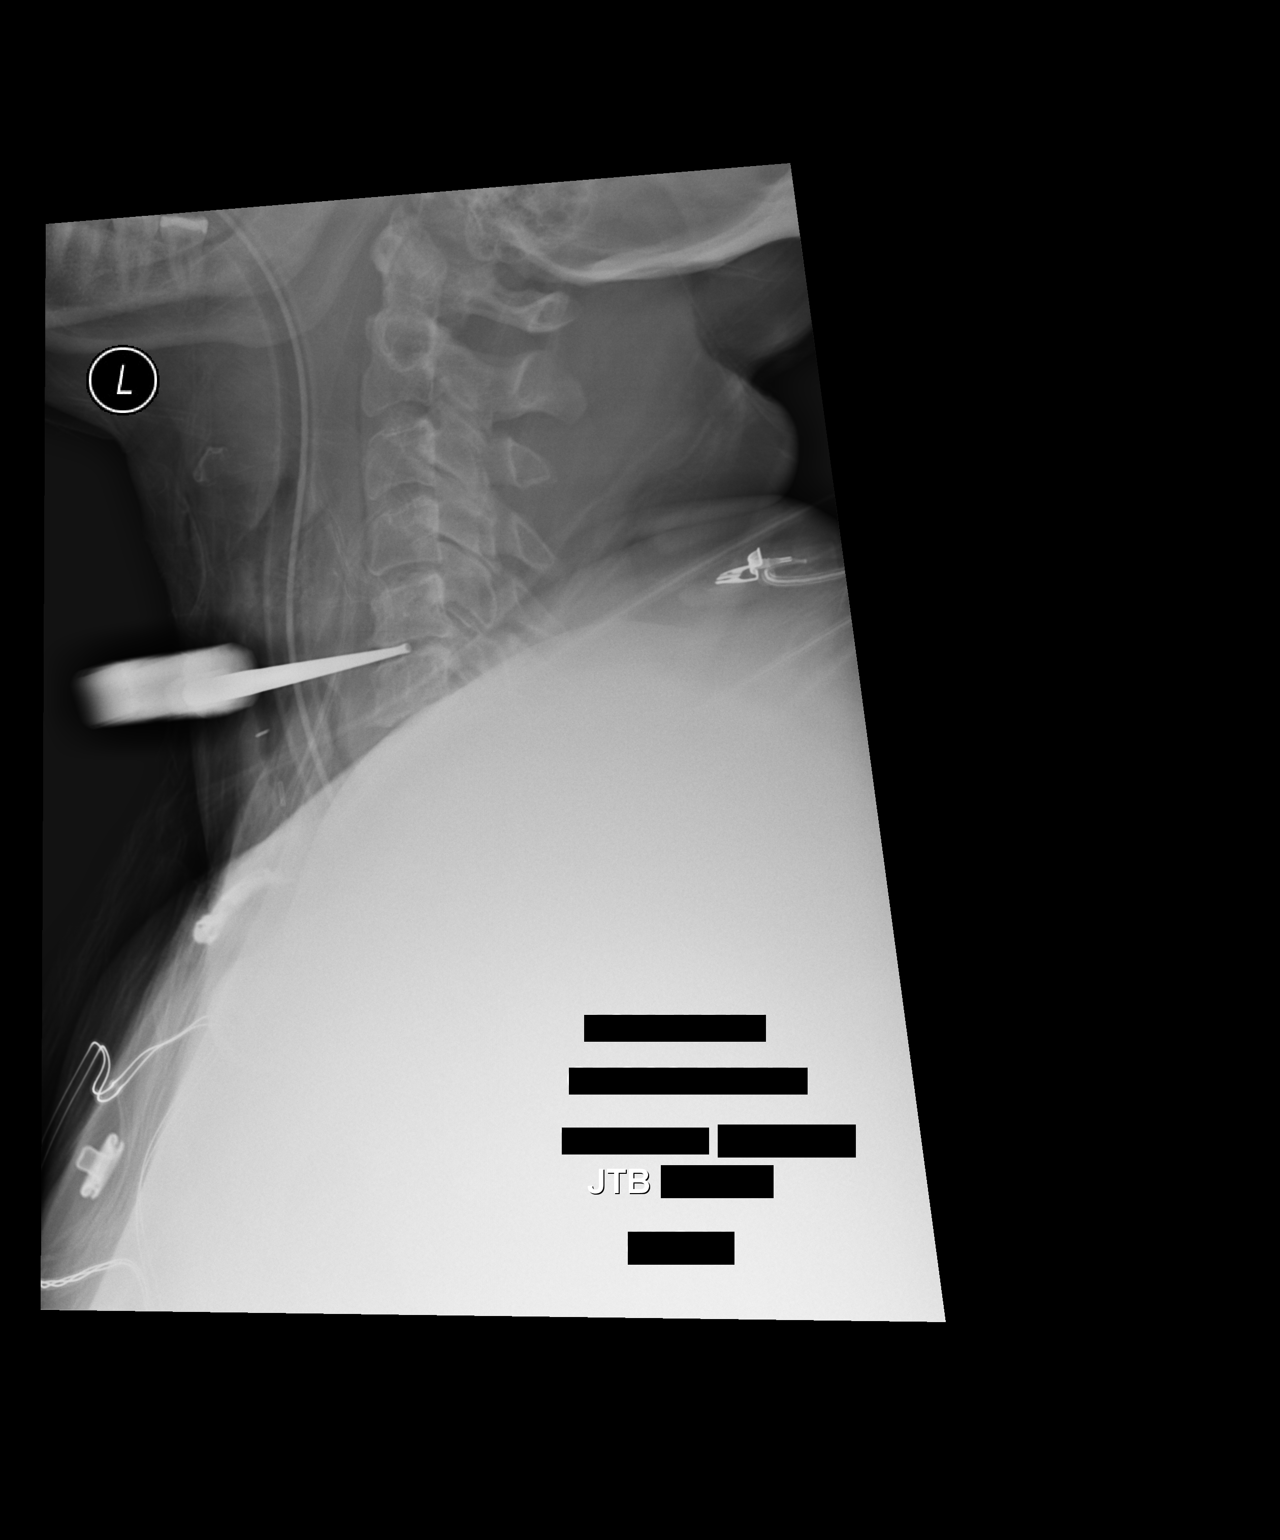

[AP (2 of 3)]
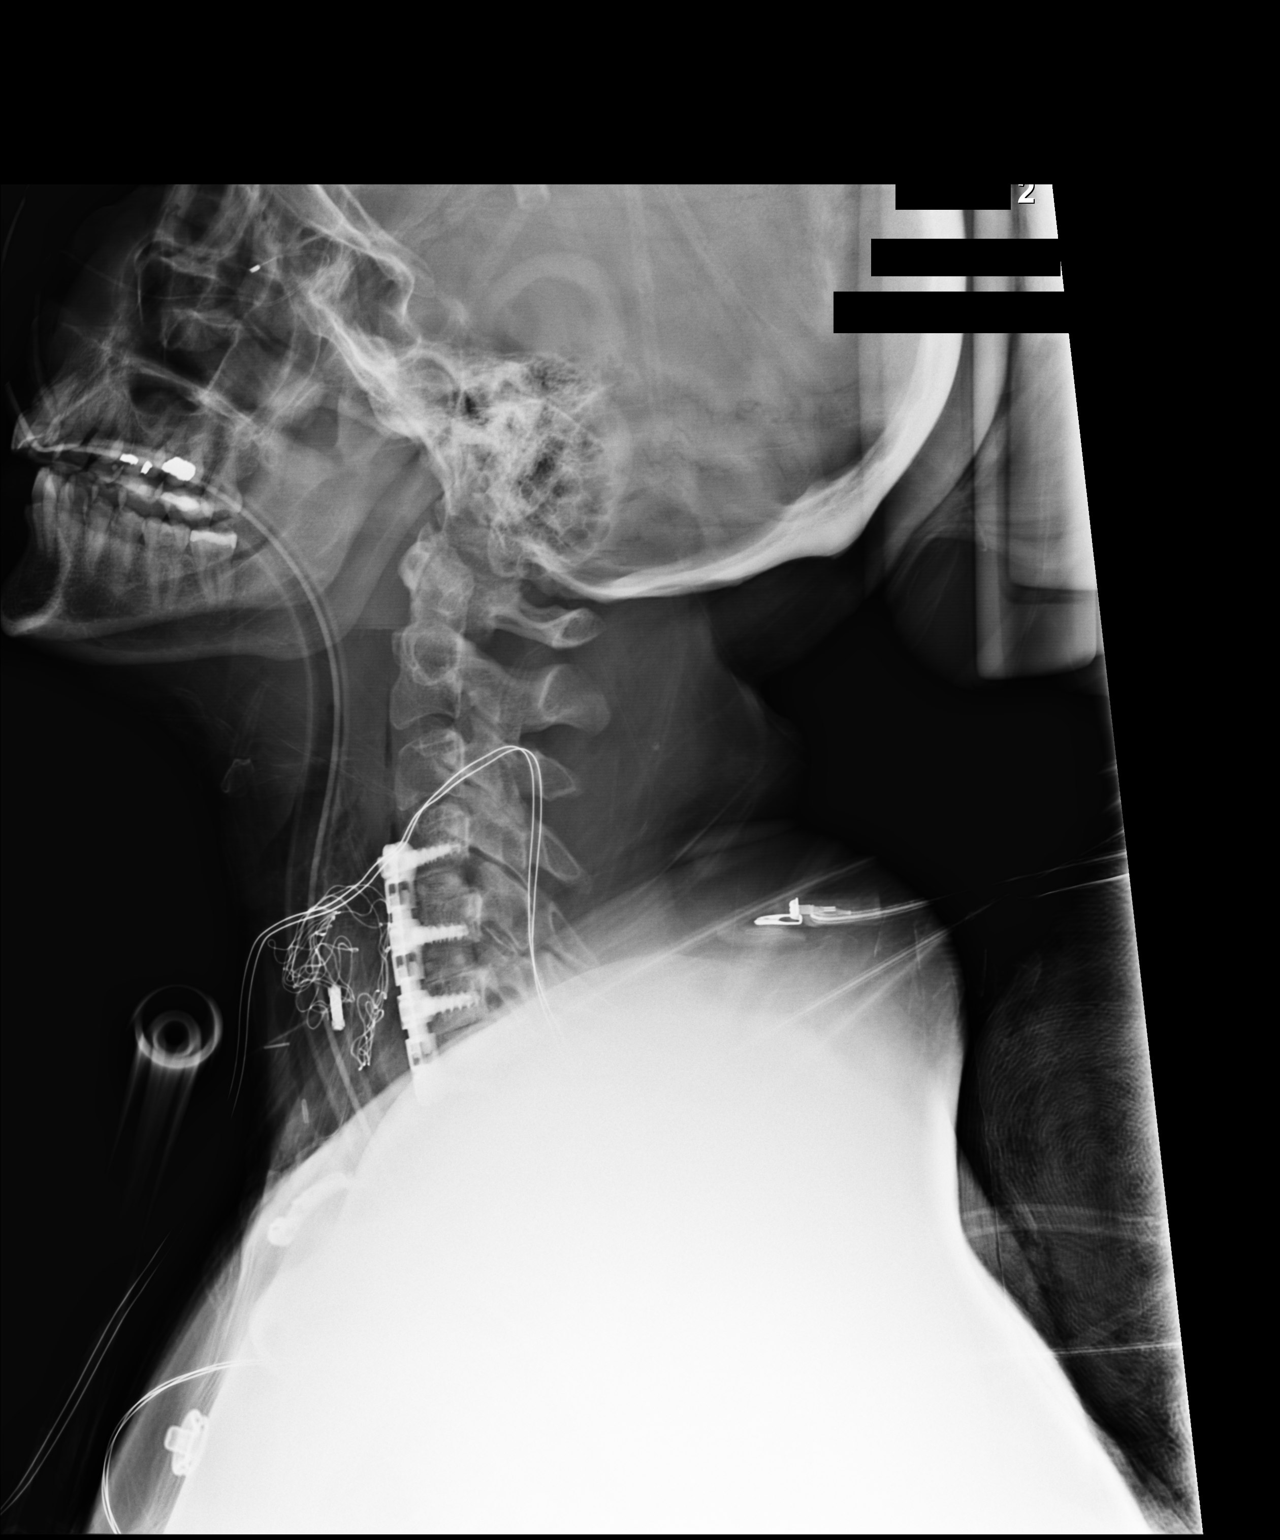

[AP (3 of 3)]
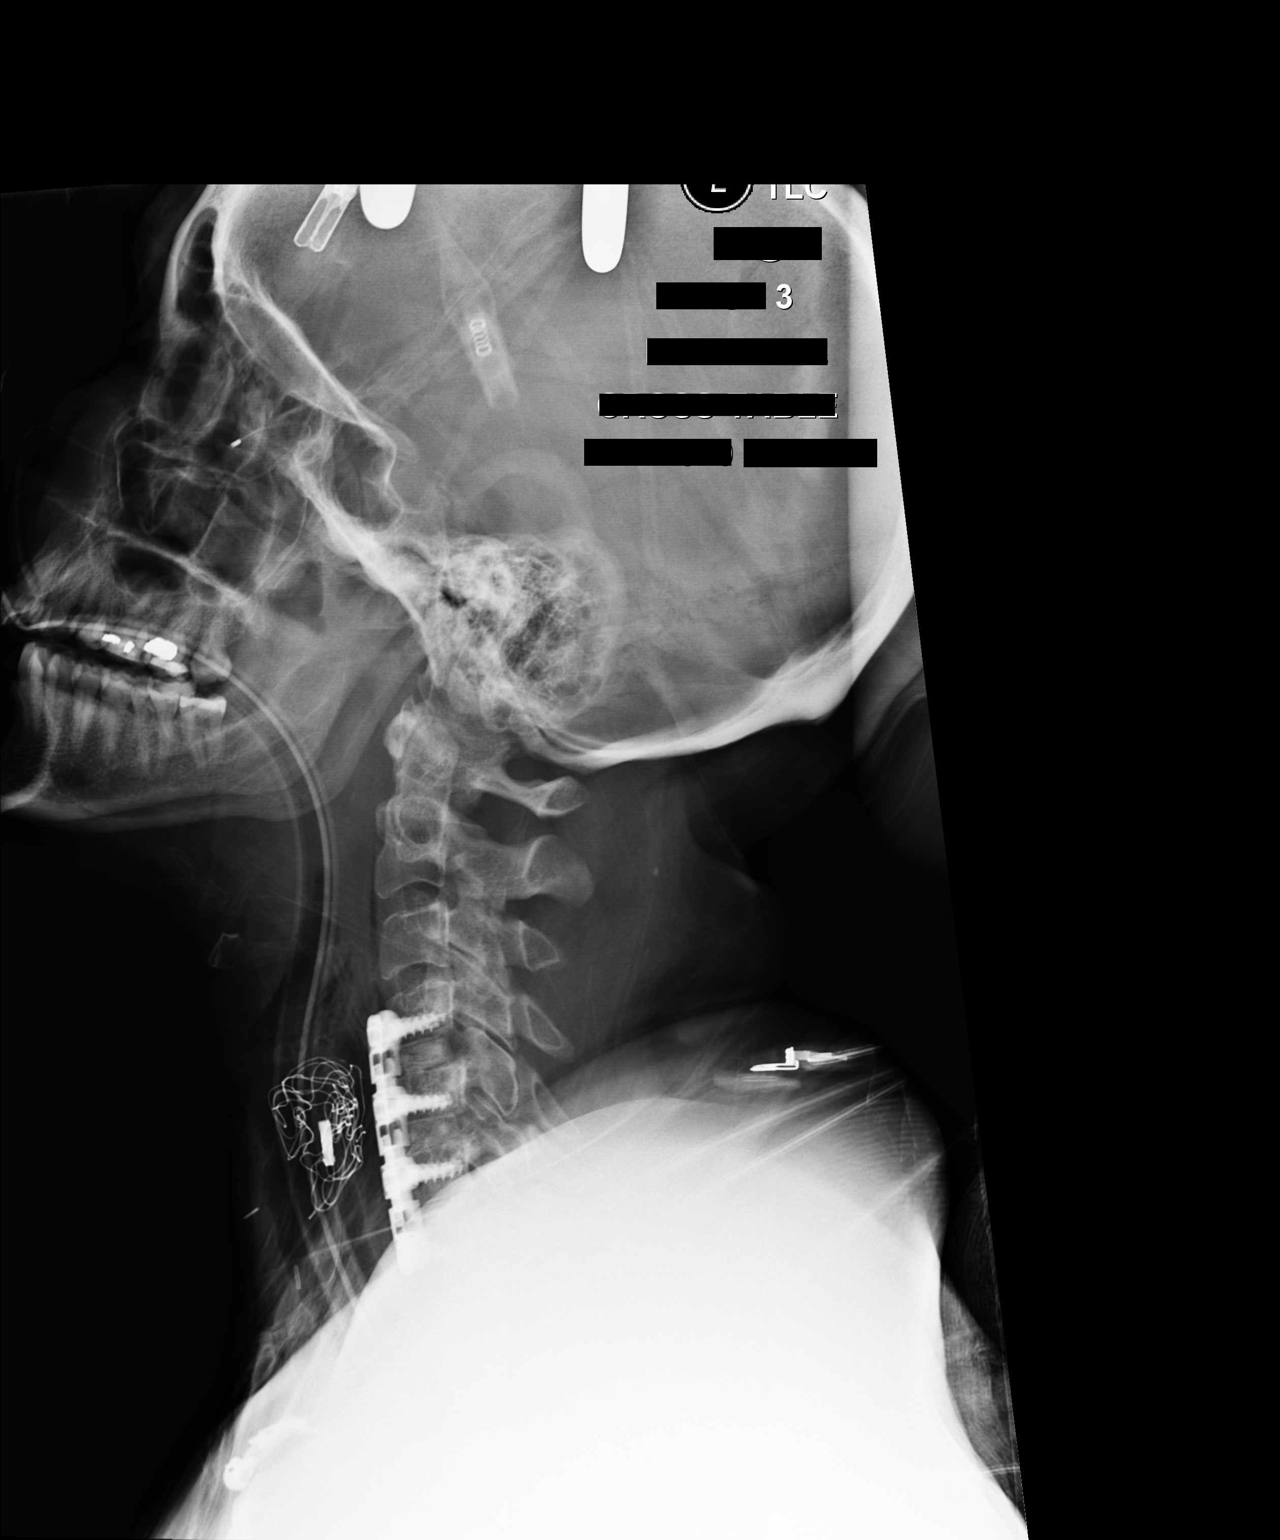

[3 of 3 positions shown; findings below may reference images not displayed]

FINDINGS: Multiple lateral views of cervical spine were obtained
intraoperatively. The initial image demonstrates a surgical
instrument in the anterior aspect of the C5-6 disc space.
Degenerative changes are noted. Subsequent interbody fusion at C4-5,
C5-6 and C6-7 is noted with anterior fixation. No acute abnormality
is noted.
IMPRESSION: Cervical fusion from C4-C7.

## 2020-08-12 MED ORDER — NITROFURANTOIN MONOHYD MACRO 100 MG PO CAPS: 100.0000 mg | ORAL_CAPSULE | Freq: Two times a day (BID) | ORAL | 0 refills | Status: AC

## 2020-08-12 NOTE — Progress Notes (Signed)
° °  Subjective:    Patient ID: Hailey Wilkins, female    DOB: 03-Aug-1971, 49 y.o.   MRN: [id number]  HPI Onset of pain with urination last night, took AZO this morning with some relief. Last UTI was 10+ years ago, but is well aware of symptoms.   No other risk factors   Has some lower abdominal cramping today, denies fever or other symptoms    Review of Systems  Constitutional: Negative.   Genitourinary: Positive for dysuria.       Objective:   Physical Exam        Assessment & Plan:  Telehealth visit with patient performed today, reviewed patient's allergies in detail and reviewed chart. No history of attempting Macrobid no known history of allergies to this medication. She will follow up with clinic with any SEs or if symptoms persist or with new concerns.   Advised increasing water avoid alcohol sugar and caffeine beverages.

## 2020-08-18 ENCOUNTER — Other Ambulatory Visit: Payer: Self-pay | Admitting: Obstetrics & Gynecology

## 2020-09-04 ENCOUNTER — Encounter: Payer: Self-pay | Admitting: Obstetrics & Gynecology

## 2020-09-07 ENCOUNTER — Telehealth: Payer: Self-pay

## 2020-09-07 DIAGNOSIS — R921 Mammographic calcification found on diagnostic imaging of breast: Secondary | ICD-10-CM

## 2020-09-07 NOTE — Telephone Encounter (Signed)
Pt sent following mychart message:  Millie, [facility] Hi!  I tried to schedule an appt for my mammogram today and they said they needed the order from you. Can you please send it to [facility] in [place]?   Also, I'm excited for your new adventure! I will be following you over there!  Rosaura Carpenter

## 2020-09-07 NOTE — Telephone Encounter (Signed)
Per last MMG on 08/2019 at [facility]:  IMPRESSION: Probable benign dystrophic calcifications in both breast.  RECOMMENDATION: Bilateral diagnostic mammogram in 1 year is recommended to complete a 2 year follow-up.  I have discussed the findings and recommendations with the patient. If applicable, a reminder letter will be sent to the patient regarding the next appointment.  BI-RADS CATEGORY  3: Probably benign.   Orders pended for bil Dx MMG.   Routing to Dr Sabra Heck for review and approval of orders

## 2020-10-06 ENCOUNTER — Other Ambulatory Visit: Payer: Self-pay

## 2020-10-06 ENCOUNTER — Ambulatory Visit
Admission: RE | Admit: 2020-10-06 | Discharge: 2020-10-06 | Disposition: A | Discharge status: Home / Self Care | Payer: BC Managed Care – PPO | Source: Ambulatory Visit | Attending: Obstetrics & Gynecology | Admitting: Obstetrics & Gynecology

## 2020-10-06 DIAGNOSIS — R921 Mammographic calcification found on diagnostic imaging of breast: Secondary | ICD-10-CM | POA: Diagnosis present

## 2020-10-08 ENCOUNTER — Other Ambulatory Visit: Payer: Self-pay

## 2020-10-08 ENCOUNTER — Ambulatory Visit: Payer: BC Managed Care – PPO | Admitting: Nurse Practitioner

## 2020-10-08 ENCOUNTER — Encounter: Payer: Self-pay | Admitting: Nurse Practitioner

## 2020-10-08 VITALS — BP 118/74 | HR 107 | Temp 98.1°F | Resp 16

## 2020-10-08 DIAGNOSIS — L272 Dermatitis due to ingested food: Secondary | ICD-10-CM

## 2020-10-08 DIAGNOSIS — T782XXA Anaphylactic shock, unspecified, initial encounter: Secondary | ICD-10-CM

## 2020-10-08 MED ORDER — PREDNISONE 10 MG (21) PO TBPK: ORAL_TABLET | ORAL | 0 refills | Status: DC

## 2020-10-08 MED ORDER — EPINEPHRINE 0.3 MG/0.3ML IJ SOAJ: 0.3000 mg | INTRAMUSCULAR | 1 refills | Status: AC | PRN

## 2020-10-08 NOTE — Progress Notes (Signed)
Subjective:    Patient ID: Hailey Wilkins, female    DOB: 23-Mar-1971, 49 y.o.   MRN: [id number]  HPI  49 year old female presenting with new onset rash that stared 48 hours ago. She had a thanksgiving meal with friends 2 nights ago and that evening started to break out in a rash on her arms and legs. The rash has since spread throughout her torso as well without involvement of head. Denies any SOB or oral symptoms.   She has been taking benadryl without full relief.   She has a large history of drug allergies but has no known food allergies.   She has also recently started [employer] supplements including probiotic, multi vitamin and omega supplement. She has been on that for about 6 weeks.   Last night she also started to experience vaginal itching without discharge and used OTC Monistat. Now has burning vaginally.   Current Outpatient Medications  Medication Instructions  . cetirizine (ZYRTEC) 10 mg, Daily  . EPINEPHrine (EPI-PEN) 0.3 mg, Intramuscular, As needed  . fluticasone (FLONASE) 50 MCG/ACT nasal spray 2 sprays, Each Nare, Daily  . metoprolol succinate (TOPROL-XL) 25 mg, Oral, Daily  . Multiple Vitamins-Minerals (MULTIVITAMIN ADULT PO) 1 tablet, Oral, Daily  . PARoxetine (PAXIL) 10 MG tablet 1 1/2 tablets daily.  . predniSONE (STERAPRED UNI-PAK 21 TAB) 10 MG (21) TBPK tablet Take 6 tablets on day one, 5 on day two, 4 on day three, 3 on day four, 2 on day five, and 1 on day six. Take with food.  . valACYclovir (VALTREX) 1000 MG tablet Take 2 tabs and repeat in 12 hours as needed for cold sores.  . VENTOLIN HFA 108 (90 Base) MCG/ACT inhaler INHALE 2 PUFFS INTO THE LUNGS EVERY 6 HOURS AS NEEDED FOR WHEEZING OR SHORTNESS OF BREATH.   Past Medical History:  Diagnosis Date  . Allergic rhinitis    Dr. Doreene Nest, s/p allergy testing  . Anemia   . Asthma   . Basal cell carcinoma of anterior chest    removed  . Breast cyst 2011   bx benign  . Complication of anesthesia   .  Depression    after father died, on [place] in past  . Family history of breast cancer   . Family history of pancreatic cancer   . Family history of stomach cancer   . GERD (gastroesophageal reflux disease)   . Gestational diabetes    with first child  . Headache(784.0)   . History of chicken pox   . Hypertension   . Inappropriate sinus node tachycardia   . Internal hemorrhoid 2006   dx during colon w/Dr Tiffany Kocher  . Migraines    associated with menses in past, tx with excedrin, may last up to 1 week  . Migraines   . Pericarditis    a. 10/2012 ETT: Ex time 9:00, No ST/T changes; b. 10/2012 Echo: EF 55-60%, no rwma, no pericardial effusion.  Marland Kitchen PONV (postoperative nausea and vomiting)    surgery in 2017 at [facility], she had no n/v  . Shingles 04/2011  . UTI (lower urinary tract infection)     Allergies  Allergen Reactions  . Amoxicillin Anaphylaxis  . Ceftriaxone Anaphylaxis  . Erythromycin Anaphylaxis  . Levofloxacin Anaphylaxis  . Rocephin [Ceftriaxone Sodium In Dextrose] Anaphylaxis  . Shellfish Allergy Anaphylaxis  . Tetracyclines & Related Anaphylaxis  . Amoxicillin-Pot Clavulanate Hives  . Clindamycin/Lincomycin Hives  . Codeine Hives  . Latex   . Sulfa Antibiotics  Hives  . Vancomycin Rash    Review of Systems  Constitutional: Negative.   HENT: Negative.   Respiratory: Negative.   Cardiovascular: Negative.   Skin: Positive for rash.  Psychiatric/Behavioral: Negative.    Today's Vitals   10/08/20 1044  BP: 118/74  Pulse: (!) 107  Resp: 16  Temp: 98.1 F (36.7 C)  TempSrc: Oral  SpO2: 99%   There is no height or weight on file to calculate BMI.    Objective:   Physical Exam Constitutional:      Appearance: Normal appearance.  HENT:     Head: Normocephalic.     Mouth/Throat:     Mouth: Mucous membranes are moist.     Pharynx: Oropharynx is clear. No oropharyngeal exudate or posterior oropharyngeal erythema.  Eyes:     Pupils: Pupils are  equal, round, and reactive to light.  Musculoskeletal:     Cervical back: Neck supple.  Skin:         Comments: Macular papular hive like rash erythematous covering extremities and torso. Skin intact skin temperature uniform.   Neurological:     Mental Status: She is alert.           Assessment & Plan:  Patient has tolerated oral steroids well in the past.  Start oral Prednisone today  Taper dose- take with food  May take Claritin twice daily for the next 48 hours- dispensed in clinic May use benadryl once in the evenings as needed for itching Topical use of benadryl or calamine as needed. Avoid topical steroids.  Refilled Epi pen, advised follow up with PCP or allergist to explore potential new food allergies  Advised to stop supplements and if she decides to restart take one at a time for a month to evaluate for any allergic response.   RTC if symptoms persist or seek immediate medical attention for any new or worsening symptoms as discussed. ] Meds ordered this encounter  Medications  . predniSONE (STERAPRED UNI-PAK 21 TAB) 10 MG (21) TBPK tablet    Sig: Take 6 tablets on day one, 5 on day two, 4 on day three, 3 on day four, 2 on day five, and 1 on day six. Take with food.    Dispense:  21 tablet    Refill:  0  . EPINEPHrine 0.3 mg/0.3 mL IJ SOAJ injection    Sig: Inject 0.3 mg into the muscle as needed for anaphylaxis.    Dispense:  1 each    Refill:  1

## 2020-10-14 ENCOUNTER — Encounter: Payer: Self-pay | Admitting: Nurse Practitioner

## 2020-10-16 ENCOUNTER — Other Ambulatory Visit: Payer: Self-pay | Admitting: Nurse Practitioner

## 2020-10-16 MED ORDER — PREDNISONE 10 MG PO TABS: 10.0000 mg | ORAL_TABLET | Freq: Two times a day (BID) | ORAL | 0 refills | Status: AC

## 2020-10-16 NOTE — Progress Notes (Signed)
Rash returned and spread after stopping prednisone taper. Instructed to take 5 day 20mg  dose, continue allergy medication and follow up with symptoms persist.

## 2020-10-19 ENCOUNTER — Other Ambulatory Visit: Payer: Self-pay | Admitting: Nurse Practitioner

## 2020-10-19 DIAGNOSIS — Z889 Allergy status to unspecified drugs, medicaments and biological substances status: Secondary | ICD-10-CM

## 2020-10-19 NOTE — Progress Notes (Signed)
Patient has a history of numerous drug allergies without known food allergies, experienced full body rash after a pot luck with friends. Rash was persistent after a taper dose of Prednisone and increased oral antihistamine use with additional topicals for relief. Additional 5 days prednisone prescribed for patient and advised allergy work up with specialist to identify new allergen.

## 2020-11-09 NOTE — Progress Notes (Signed)
New Patient Note  RE: Hailey Wilkins MRN: [id number] DOB: 1971/06/28 Date of Office Visit: 11/10/2020  Referring provider: Apolonio Schneiders, FNP Primary care provider: Ebbie Ridge, MD  Chief Complaint: Allergic Reaction and Allergy Testing  History of Present Illness: I had the pleasure of seeing Hailey Wilkins for initial evaluation at the [facility] on 11/11/2020. She is a 49 y.o. female, who is referred here by Apolonio Schneiders, FNP for the evaluation of allergies.  Rash started about 1 month ago. This started on her legs, then spread to her arms and torso. Describes them as erythematous, bumpy, and areas of welting as well which were very pruritic. Individual rashes lasted about 1 week. Some bruising which may have been due excessive scratching. Associated symptoms include: none. Suspected triggers are unknown.  Reviewed images on the phone - leg rash looks like urticarial rash, hand rash was flat.   Denies any fevers, chills, or changes in foods, personal care products or recent infections. Patient started taking a new essential oil vitamins called Doterra. She took it for about 1.5 months before she started to have the rash.  Patient has stopped taking it about 1 month ago.   She has tried the following therapies: zyrtec, benadryl, Claritin with good benefit. Systemic steroids 2 courses. Currently on no medications.  Previous work up includes: none. Previous history of rash/hives: yes, mainly with antibiotics.  Patient is up to date with the following cancer screening tests: pap smears, physical exam, mammogram.  Referral note: "Patient has a history of numerous drug allergies without known food allergies, experienced full body rash after a pot luck with friends. Rash was persistent after a taper dose of Prednisone and increased oral antihistamine use with additional topicals for relief. Additional 5 days prednisone prescribed for patient and advised  allergy work up with specialist to identify new allergen."  [employer] VMz  Food Nutrient Complex Ingredients:  "A balanced blend of essential vitamins including the antioxidant vitamins A, C, and E; an energy complex of B vitamins; presented in a patented glycoprotein matrix for enhanced bioavailability* Includes a balanced blend of chelated minerals including calcium, magnesium, zinc, selenium, copper, manganese, and chromium Includes a whole-food botanical blend of kale, dandelion, parsley, kelp, broccoli, brussels sprout, cabbage, and spinach Formulated with a digestive enzyme blend of protease, lactase, lipase, amylase, ?-galactosidase, diastase, glucoamylase, and peptidase Contains a full-spectrum of vitamin E forms including tocotrienols Includes the doTERRA tummy tamer digestive blend of Peppermint, Ginger, and Caraway* Made with sodium lauryl sulfate-free vegetable capsules"  xEO Mega   Essential Oil Omega Complex Ingredients:  "Provides a wide range of omega-3 fatty acids including EPA, DHA, ALA, and SDA from marine and plant sources to help maintain a healthy balance of these vital nutrients* Includes the powerful antioxidant carotenoid, astaxanthin* Formulated with a proprietary blend of doTERRA CPTG Certified Pure Tested GradeT essential oils of Clove, Frankincense, Thyme, Cumin, Wild Orange, Peppermint, Ginger, [place], and [place] Chamomile Includes 20 mcg of natural vitamin D and 40 mg of natural vitamin E Does not contain milk or wheat products   Alpha CRS+  Cellular Vitality Complex Ingredients: "Contains a proprietary antioxidant cellular longevity blend exclusive to d?TERRA that includes potent amounts of powerful polyphenols including baicalin from scutellaria root, resveratrol from Polygonum cuspidatum, ellagic acid from pomegranate, proanthocyanidins from grape seeds, curcumin from turmeric root, and silymarin from milk thistle to help support healthy cell  function* Contains a standardized extract of Boswellia serrata for boswellic  acid that supports healthy cell function* Contains a standardized extract of Ginkgo biloba that supports mental clarity and function* Includes potent levels of metabolic factors of cellular energy (coenzyme Q10, quercetin, alpha-lipoic acid, and acetyl-l-carnitine)* Made with sodium lauryl sulfate-free vegetable capsules; does not contain milk or animal products"    Assessment and Plan: Hailey Wilkins is a 49 y.o. female with: Rash and other nonspecific skin eruption Broke out in whole body pruritic rash 1 month ago.  This cleared up with 2 rounds of prednisone.  She started 3 new over-the-counter essential oil vitamins about a month and a half prior to this.  No other changes in her medications, diet or personal care products.  History of breaking out in rash/hives with antibiotics. Concerned about allergies triggering this episode.  Reviewed ingredient lists at length for the supplements. Some of the ingredients, I do not have skin testing available.   Today's skin testing was negative to select foods.   Not sure what triggered this rash episode.  Continue to avoid those 3 essential oil multivitamins. Get bloodwork to rule out other etiologies.   Keep track of any other reactions/rash flares - write down what you had come across that day or consumed that day.   Multiple drug allergies Patient has an extensive list of drug allergies mainly involving antibiotics. Her main reactions are usually rash/hives but there are some that cause perioral blistering - questions reactivation of herpes when taking antibiotics. The erythromycin/tetracyclines caused blistering in the mouth and throat.  Continue to avoid drugs on allergy list.  Recommend penicillin skin testing/drug challenge first.  Will not challenge erythromycin/tetracyclines due to the blistering involving mucous membranes.  Urticaria History of urticaria/rash  mainly with antibiotics. Given history, she may have some underlying urticaria as well.  Mild intermittent asthma without complication Usually flares with coughing and chest tightness in the spring and fall for a few weeks requiring albuterol.  Today's spirometry was normal.  May use albuterol rescue inhaler 2 puffs every 4 to 6 hours as needed for shortness of breath, chest tightness, coughing, and wheezing. Monitor frequency of use.   Other allergic rhinitis Rhinoconjunctivitis symptoms for 40+ years mainly in the spring and fall.  Using Zyrtec, Flonase and Pataday eyedrops with good benefit.  Patient was on allergy immunotherapy for many years on 2 separate occasions.  Status post sinus surgery as well.  Today's skin testing showed: Positive to grass, ragweed, trees. Borderline to weed.  Start environmental control measures as below.  May use over the counter antihistamines such as Zyrtec (cetirizine), Claritin (loratadine), Allegra (fexofenadine), or Xyzal (levocetirizine) daily as needed.  May use Flonase (fluticasone) nasal spray 1 spray per nostril twice a day as needed for nasal congestion.   May use olopatadine eye drops 0.2% once a day as needed for itchy/watery eyes.  Allergic conjunctivitis of both eyes  See assessment and plan as above for allergic rhinitis.  Adverse reaction to food, subsequent encounter Reaction to shrimp at age 22.  No prior evaluation.  Tolerates finned fish with no issues.  Concerned about dairy, wheat, nuts, pumpkin, corn and tomato allergy as well as they seem to cause diarrhea and bloating.  Today's skin testing showed: Borderline positive to pistachios.   Continue to avoid shellfish given clinical history.   If bloodwork is negative then will do an in office food challenge next.   Most likely has intolerances to dairy, wheat, nuts, tomato, corn and pumpkins. Continue to avoid them as well.   For mild  symptoms you can take over the counter  antihistamines such as Benadryl and monitor symptoms closely. If symptoms worsen or if you have severe symptoms including breathing issues, throat closure, significant swelling, whole body hives, severe diarrhea and vomiting, lightheadedness then inject epinephrine and seek immediate medical care afterwards.  Emergency action plan given.   Return for Drug challenge.  Lab Orders     Allergen Gum Carageenan     Allergen Profile, Shellfish     Alpha-Gal Panel     ANA w/Reflex     CBC with Differential/Platelet     Chronic Urticaria     Comprehensive metabolic panel     C-reactive protein     Sedimentation rate     Tryptase     Thyroid Cascade Profile     Latex, IgE  Other allergy screening: Asthma: yes  Only takes albuterol as needed during the spring and the fall with good benefit for a few times per week. No oral prednisone for asthma this year. Usually gets chest tightness, coughing.  Rhino conjunctivitis: yes  She reports symptoms of coughing, wheezing, watery eyes. Symptoms have been going on for 40+ years. The symptoms are present all year around with worsening in spring and fall. She has used zyrtec, Flonase, pataday eye drops with fair improvement in symptoms. Sinus infections: none recently. Previous work up includes: skin testing and bloodwork over 15+ years ago which showed multiple positives per patient report. Patient was on allergy injections x 2 for many years. Previous ENT evaluation: yes and had 2 sinus surgeries. History of nasal polyps: no. History of reflux: occasionally.  Food allergy: yes  Shellfish caused throat closure around age 25 after eating shrimp.  No evaluation and has Epipen on hand. No issues finned fish.   Dairy, wheat, nuts, pumpkins, corn, tomato cause diarrhea and bloating. No other symptoms.   Medication allergy: yes  Levaquin and rocephin via IV- patient had blistering of her lips 4-5 hours after infusion. This was the first time she had  these 2 antibiotics. This took about 1 week to resolve.  Amoxicillin - rash and welts, blistering around her mouth. The symptoms usually appear at the end of her treatment regimen. This was many years ago.  Augmentin - hives   Ceftriaxone - not sure. Erythromycin/tetracyclines - broke out in a rash, blistering in her mouth and throat.  Clindamycin - rash, pin point rash.   Codeine - hives, itchy head.  Latex - hives.  Sulfa - as an infant, hives but not sure. Vancomycin - during surgery, rash  She had some type neck surgery where she was premedicated with benadryl and given steroids. First time she didn't have any type of reactions to IV antibiotics - she is not sure what IV antibiotics she received though.   Amoxicillin High Anaphylaxis   Ceftriaxone High Anaphylaxis   Erythromycin High Anaphylaxis   Levofloxacin High Anaphylaxis   Rocephin [ceftriaxone Sodium In Dextrose] High Anaphylaxis   Shellfish Allergy High Anaphylaxis   Tetracyclines & Related High Anaphylaxis   Amoxicillin-pot Clavulanate Not Specified Hives   Clindamycin/lincomycin Not Specified Hives   Codeine Not Specified Hives   Latex Not Specified    Sulfa Antibiotics Not Specified Hives   Vancomycin      Hymenoptera allergy: large localized reactions.  Urticaria: yes  Only during allergic reactions Eczema:yes  Takes oatmeal baths.  History of recurrent infections suggestive of immunodeficency: no  Diagnostics: Spirometry:  Tracings reviewed. Her effort: Good reproducible efforts. FVC: 2.81L  FEV1: 2.39L, 88% predicted FEV1/FVC ratio: 85% Interpretation: Spirometry consistent with normal pattern.  Please see scanned spirometry results for details.  Skin Testing: Environmental allergy panel and select foods. Positive to grass, ragweed, trees. Borderline to weed. Borderline positive to pistachios - results given.   Results discussed with patient/family.  Airborne Adult Perc - 11/10/20 1052    Time  Antigen Placed 1100    Allergen Manufacturer Lavella Hammock    Location Back    Number of Test 59    Panel 1 Select    1. Control-Buffer 50% Glycerol Negative    2. Control-Histamine 1 mg/ml 2+    3. Albumin saline Negative    4. Wakeman 4+    5. [place] 4+    6. Johnson 2+    7. Kentucky Blue 2+    8. Meadow Fescue 2+    9. Perennial Rye 3+    10. Sweet Vernal 3+    11. Timothy 2+    12. Cocklebur Negative    13. Burweed Marshelder --   +/-   14. Ragweed, short 2+    15. Ragweed, Giant 2+    16. Plantain,  English --   +/-   17. Lamb's Quarters Negative    18. Sheep Sorrell Negative    19. Rough Pigweed Negative    20. Marsh Elder, Rough Negative    21. Mugwort, Common Negative    22. Ash mix 2+    23. Wendee Copp mix --   +/-   24. Beech American 2+    25. Box, Elder Negative    26. Cedar, red Negative    27. Cottonwood, Eastern 2+    28. Elm mix Negative    29. Hickory --   +/-   30. Maple mix Negative    31. Oak, Russian [place] mix 2+    32. Pecan Pollen Negative    33. Pine mix Negative    34. Sycamore Eastern 2+    35. Richfield, Black Pollen 2+    36. Alternaria alternata Negative    37. Cladosporium Herbarum Negative    38. Aspergillus mix Negative    39. Penicillium mix Negative    40. Bipolaris sorokiniana (Helminthosporium) Negative    41. Drechslera spicifera (Curvularia) Negative    42. Mucor plumbeus Negative    43. Fusarium moniliforme Negative    44. Aureobasidium pullulans (pullulara) Negative    45. Rhizopus oryzae Negative    46. Botrytis cinera Negative    47. Epicoccum nigrum Negative    48. Phoma betae Negative    49. Candida Albicans Negative    50. Trichophyton mentagrophytes Negative    51. Mite, D Farinae  5,000 AU/ml Negative    52. Mite, D Pteronyssinus  5,000 AU/ml Negative    53. Cat Hair 10,000 BAU/ml Negative    54.  Dog Epithelia Negative    55. Mixed Feathers Negative    56. Horse Epithelia Negative    57. Cockroach, German Negative    58. Mouse  Negative    59. Tobacco Leaf Negative          Food Adult Perc - 11/10/20 1000    Time Antigen Placed 1100    Allergen Manufacturer Lavella Hammock    Location Back    Number of allergen test 31    Control-buffer 50% Glycerol Negative    Control-Histamine 1 mg/ml 2+    1. Peanut Negative    2. Soybean Negative    3. Wheat Negative  4. Sesame Negative    5. Milk, cow Negative    6. Egg White, Chicken Negative    7. Casein Negative    8. Shellfish Mix Negative    10. Cashew Negative    11. Pecan Food Negative    12. Kensett Negative    13. Almond Negative    14. Hazelnut Negative    15. Bolivia nut Negative    16. Coconut Negative    17. Pistachio --   +/-   25. Shrimp Negative    26. Crab Negative    27. Lobster Negative    28. Oyster Negative    29. Scallops Negative    30. Barley Negative    31. Oat  Negative    32. Rye  Negative    33. Hops Negative    42. Tomato Negative    50. Cabbage Negative    53. Corn Negative    65. Karaya Gum Negative    66. Acacia (Arabic Gum) Negative    69. Ginger Negative           Past Medical History: Patient Active Problem List   Diagnosis Date Noted  . Rash and other nonspecific skin eruption 11/10/2020  . Allergic conjunctivitis of both eyes 11/10/2020  . Mild intermittent asthma without complication 93/81/0175  . Urticaria 11/10/2020  . Allergic reaction 11/10/2020  . Adverse reaction to food, subsequent encounter 11/10/2020  . HNP (herniated nucleus pulposus), cervical 06/12/2019  . Hyperkalemia 11/11/2018  . Intermittent palpitations 11/11/2018  . Hx of anaphylaxis 12/14/2017  . Multiple drug allergies 12/14/2017  . Genetic testing 06/14/2017  . Family history of breast cancer   . Family history of stomach cancer   . Family history of pancreatic cancer   . Emotional lability 01/23/2015  . Pain of left calf 09/24/2013  . Asthma, mild intermitent 12/18/2012  . Other allergic rhinitis 12/18/2012  . Sinus tachycardia  12/15/2012  . Chest pain on breathing 11/09/2012  . Migraine 05/31/2012  . Hot flashes 04/23/2012  . Atopic dermatitis 04/05/2012  . Shellfish allergy 04/05/2012  . Sinusitis, chronic 04/05/2012  . Sinusitis, chronic 02/13/2012  . Allergy, drug 02/13/2012   Past Medical History:  Diagnosis Date  . Allergic rhinitis    Dr. Doreene Nest, s/p allergy testing  . Anemia   . Asthma   . Basal cell carcinoma of anterior chest    removed  . Breast cyst 2011   bx benign  . Complication of anesthesia   . Depression    after father died, on [place] in past  . Family history of breast cancer   . Family history of pancreatic cancer   . Family history of stomach cancer   . GERD (gastroesophageal reflux disease)   . Gestational diabetes    with first child  . Headache(784.0)   . History of chicken pox   . Hypertension   . Inappropriate sinus node tachycardia   . Internal hemorrhoid 2006   dx during colon w/Dr Tiffany Kocher  . Migraines    associated with menses in past, tx with excedrin, may last up to 1 week  . Migraines   . Pericarditis    a. 10/2012 ETT: Ex time 9:00, No ST/T changes; b. 10/2012 Echo: EF 55-60%, no rwma, no pericardial effusion.  Marland Kitchen PONV (postoperative nausea and vomiting)    surgery in 2017 at [place], she had no n/v  . Shingles 04/2011  . Urticaria 11/10/2020  . UTI (lower urinary tract infection)  Past Surgical History: Past Surgical History:  Procedure Laterality Date  . ANTERIOR CERVICAL DECOMP/DISCECTOMY FUSION N/A 06/12/2019   Procedure: ANTERIOR CERVICAL DECOMPRESSION/DISCECTOMY FUSION CERVICAL FOUR- CERVICAL FIVE, CERVICAL FIVE- CERVICAL SIX, CERVICAL SIX- CERVICAL SEVEN;  Surgeon: Jovita Gamma, MD;  Location: [facility];  Service: Neurosurgery;  Laterality: N/A;  ANTERIOR CERVICAL DECOMPRESSION/DISCECTOMY FUSION CERVICAL 4- CERVICAL 5, CERVICAL 5- CERVICAL 6, CERVICAL 6- CERVICAL7  . BASAL CELL CARCINOMA EXCISION  2011   removed [facility] chest/[facility]  . BREAST BIOPSY Right 07/25/2018   stereo bx coil clip- CYSTIC PAPILLARY APOCRINE METAPLASIA  . CHOLECYSTECTOMY  2010   [facility]  Marland Kitchen COLONOSCOPY    . KNEE ARTHROSCOPY W/ MENISCAL REPAIR  1989   Right, Dr. Mauri Pole  . NASAL SINUS SURGERY  1999, 2005  . REDUCTION MAMMAPLASTY  2017  . TONSILLECTOMY  1988   Medication List:  Current Outpatient Medications  Medication Sig Dispense Refill  . cetirizine (ZYRTEC) 10 MG tablet Take 10 mg by mouth daily.    Marland Kitchen EPINEPHrine 0.3 mg/0.3 mL IJ SOAJ injection Inject 0.3 mg into the muscle as needed for anaphylaxis. 1 each 1  . fluticasone (FLONASE) 50 MCG/ACT nasal spray Place 2 sprays into both nostrils daily.     . metoprolol succinate (TOPROL-XL) 50 MG 24 hr tablet Take 0.5 tablets (25 mg total) by mouth daily. 90 tablet 3  . PARoxetine (PAXIL) 10 MG tablet 1 1/2 tablets daily. 135 tablet 4  . valACYclovir (VALTREX) 1000 MG tablet Take 2 tabs and repeat in 12 hours as needed for cold sores. 90 tablet 1  . VENTOLIN HFA 108 (90 Base) MCG/ACT inhaler INHALE 2 PUFFS INTO THE LUNGS EVERY 6 HOURS AS NEEDED FOR WHEEZING OR SHORTNESS OF BREATH. (Patient taking differently: Inhale 2 puffs into the lungs every 6 (six) hours as needed for wheezing or shortness of breath.) 18 Inhaler 0  . Multiple Vitamins-Minerals (MULTIVITAMIN ADULT PO) Take 1 tablet by mouth daily.     No current facility-administered medications for this visit.   Allergies: Allergies  Allergen Reactions  . Amoxicillin Anaphylaxis  . Ceftriaxone Anaphylaxis  . Erythromycin Anaphylaxis  . Levofloxacin Anaphylaxis  . Rocephin [Ceftriaxone Sodium In Dextrose] Anaphylaxis  . Shellfish Allergy Anaphylaxis  . Tetracyclines & Related Anaphylaxis  . Amoxicillin-Pot Clavulanate Hives  . Clindamycin/Lincomycin Hives  . Codeine Hives  . Latex   . Sulfa Antibiotics Hives  . Vancomycin Rash   Social History: Social History   Socioeconomic History  . Marital  status: Married    Spouse name: Not on file  . Number of children: 2  . Years of education: Not on file  . Highest education level: Not on file  Occupational History  . 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 Smoking status: Never Smoker  . Smokeless tobacco: Never Used  Vaping Use  . Vaping Use: Never used  Substance and Sexual Activity  . Alcohol use: Yes    Alcohol/week: 5.0 standard drinks    Types: 5 Glasses of wine per week    Comment: weekly  . Drug use: No  . Sexual activity: Yes    Partners: Male    Birth control/protection: Post-menopausal  Other Topics Concern  . Not on file  Social History Narrative   Lives in [place] with husband and [place]en and 11YO. Works at [employer]. Dog and Cat.      Regular Exercise -  GYM,  wts, walking, elliptical - 3 times a week x 1 hour   Daily Caffeine Use:  3 cups coffee, 1 diet soda            Social Determinants of Health   Financial Resource Strain: Not on file  Food Insecurity: Not on file  Transportation Needs: Not on file  Physical Activity: Not on file  Stress: Not on file  Social Connections: Not on file   Lives in a 50 year old house. 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 Hypertension Mother   . Cancer Mother 19       Breast Ca  . Arthritis Mother   . Breast cancer Mother 84       2nd primary  . Cancer Father 9       Pancreatic  . Diabetes Father   . Hypertension Father   . Hyperlipidemia Father   . Heart disease Father   . Cancer Sister 55       Thyroid  . Cancer Maternal Grandmother        might have had cancer, died at 16  . Alcohol abuse Maternal Grandfather   . Pancreatic cancer  Maternal Grandfather 88       died in his 70's  . Alcohol abuse Paternal Grandfather   . Heart disease Paternal Grandfather   . Hyperlipidemia Paternal Grandfather   . Hypertension Paternal Grandfather   . Other Paternal Grandmother        brain tumor on pitutary gland  . Brain cancer Paternal Grandmother 2       pituitary tumor, died in her 60's  . Breast cancer Cousin 59       maternal couisn, is now in her 43's  . Stomach cancer Maternal Aunt 75       is now 46  . Stomach cancer Maternal Aunt 75       died in 50's  . Breast cancer Cousin 40       maternal cousin, is now 80   Problem                               Relation Asthma                                   Son  Eczema                                No  Food allergy                          No  Allergic rhino conjunctivitis     Mother   Review of Systems  Constitutional: Negative for appetite change, chills, fever and unexpected weight change.  HENT: Negative for congestion and rhinorrhea.   Eyes: Negative for itching.  Respiratory: Negative for cough, chest tightness, shortness of breath and wheezing.   Cardiovascular: Negative for chest pain.  Gastrointestinal: Negative for abdominal pain.  Genitourinary: Negative for difficulty urinating.  Skin: Negative for rash.  Allergic/Immunologic: Positive for environmental  allergies.  Neurological: Negative for headaches.   Objective: BP 110/78 (BP Location: Left Arm, Patient Position: Sitting, Cuff Size: Normal)   Pulse 83   Temp (!) 96.5 F (35.8 C) (Temporal)   Resp 18   Ht 5' 3.39" (1.61 m)   Wt 169 lb 6.4 oz (76.8 kg)   LMP 02/10/2018   SpO2 98%   BMI 29.64 kg/m  Body mass index is 29.64 kg/m. Physical Exam Vitals and nursing note reviewed.  Constitutional:      Appearance: Normal appearance. She is well-developed.  HENT:     Head: Normocephalic and atraumatic.     Right Ear: External ear normal.     Left Ear: External ear normal.     Nose: Nose  normal.     Mouth/Throat:     Mouth: Mucous membranes are moist.     Pharynx: Oropharynx is clear.  Eyes:     Conjunctiva/sclera: Conjunctivae normal.  Cardiovascular:     Rate and Rhythm: Normal rate and regular rhythm.     Heart sounds: Normal heart sounds. No murmur heard. No friction rub. No gallop.   Pulmonary:     Effort: Pulmonary effort is normal.     Breath sounds: Normal breath sounds. No wheezing, rhonchi or rales.  Musculoskeletal:     Cervical back: Neck supple.  Skin:    General: Skin is warm.     Findings: No rash.  Neurological:     Mental Status: She is alert and oriented to person, place, and time.  Psychiatric:        Behavior: Behavior normal.    The plan was reviewed with the patient/family, and all questions/concerned were addressed.  It was my pleasure to see [place] today and participate in her care. Please feel free to contact me with any questions or concerns.  Sincerely,  Rexene Alberts, DO Allergy & Immunology  [facility] office: [place] office: [phone]  80 minutes spent face-to-face with more than 50% of the time spent discussing allergic reactions, drug allergies, rashes, asthma, allergic rhinitis, food allergy.

## 2020-11-10 ENCOUNTER — Other Ambulatory Visit: Payer: Self-pay

## 2020-11-10 ENCOUNTER — Encounter: Payer: Self-pay | Admitting: Allergy

## 2020-11-10 ENCOUNTER — Ambulatory Visit: Payer: BC Managed Care – PPO | Admitting: Allergy

## 2020-11-10 VITALS — BP 110/78 | HR 83 | Temp 96.5°F | Resp 18 | Ht 63.39 in | Wt 169.4 lb

## 2020-11-10 DIAGNOSIS — J4520 Mild intermittent asthma, uncomplicated: Secondary | ICD-10-CM | POA: Diagnosis not present

## 2020-11-10 DIAGNOSIS — T781XXD Other adverse food reactions, not elsewhere classified, subsequent encounter: Secondary | ICD-10-CM | POA: Diagnosis not present

## 2020-11-10 DIAGNOSIS — T7840XA Allergy, unspecified, initial encounter: Secondary | ICD-10-CM | POA: Insufficient documentation

## 2020-11-10 DIAGNOSIS — Z889 Allergy status to unspecified drugs, medicaments and biological substances status: Secondary | ICD-10-CM

## 2020-11-10 DIAGNOSIS — H1013 Acute atopic conjunctivitis, bilateral: Secondary | ICD-10-CM

## 2020-11-10 DIAGNOSIS — T7840XD Allergy, unspecified, subsequent encounter: Secondary | ICD-10-CM

## 2020-11-10 DIAGNOSIS — J3089 Other allergic rhinitis: Secondary | ICD-10-CM | POA: Diagnosis not present

## 2020-11-10 DIAGNOSIS — R21 Rash and other nonspecific skin eruption: Secondary | ICD-10-CM | POA: Diagnosis not present

## 2020-11-10 DIAGNOSIS — L509 Urticaria, unspecified: Secondary | ICD-10-CM

## 2020-11-10 HISTORY — DX: Urticaria, unspecified: L50.9

## 2020-11-10 NOTE — Patient Instructions (Addendum)
Today's skin testing showed: Positive to grass, ragweed, trees. Borderline to weed. Borderline positive to pistachios - results given.    Rash:  Not sure what triggered your rash episode.  Continue to avoid those 3 essential oil multivitamins. Get bloodwork:  We are ordering labs, so please allow 1-2 weeks for the results to come back. With the newly implemented Cures Act, the labs might be visible to you at the same time that they become visible to me. However, I will not address the results until all of the results are back, so please be patient.   Keep track of any other reactions - write down what you had across/eaten that day.  Foods:  Continue to avoid shellfish.  If bloodwork is negative will do an in office food challenge next.   You may have intolerances to dairy, wheat, nuts, tomato, corn and pumpkins.  For mild symptoms you can take over the counter antihistamines such as Benadryl and monitor symptoms closely. If symptoms worsen or if you have severe symptoms including breathing issues, throat closure, significant swelling, whole body hives, severe diarrhea and vomiting, lightheadedness then inject epinephrine and seek immediate medical care afterwards.  Emergency action plan.    Drugs:  Continue to avoid drugs on your allergy list.  Recommend that we do penicillin skin testing/drug challenge first.  You must be off antihistamines for 3-5 days before. Must be in good health and not ill. Plan on being in the office for 2-3 hours and must bring in the drug you want to do the oral challenge for - will send in prescription to pick up a few days before. You must call to schedule an appointment and specify it's for a drug challenge.   Environmental allergies  Start environmental control measures as below.  May use over the counter antihistamines such as Zyrtec (cetirizine), Claritin (loratadine), Allegra (fexofenadine), or Xyzal (levocetirizine) daily as needed.  May use  Flonase (fluticasone) nasal spray 1 spray per nostril twice a day as needed for nasal congestion.   May use olopatadine eye drops 0.2% once a day as needed for itchy/watery eyes.  Asthma:  Today's breathing test was normal.  May use albuterol rescue inhaler 2 puffs every 4 to 6 hours as needed for shortness of breath, chest tightness, coughing, and wheezing. Monitor frequency of use.   Follow up for penicillin skin testing and challenge.   Reducing Pollen Exposure  Pollen seasons: trees (spring), grass (summer) and ragweed/weeds (fall).  Keep windows closed in your home and car to lower pollen exposure.   Install air conditioning in the bedroom and throughout the house if possible.   Avoid going out in dry windy days - especially early morning.  Pollen counts are highest between 5 - 10 AM and on dry, hot and windy days.   Save outside activities for late afternoon or after a heavy rain, when pollen levels are lower.   Avoid mowing of grass if you have grass pollen allergy.  Be aware that pollen can also be transported indoors on people and pets.   Dry your clothes in an automatic dryer rather than hanging them outside where they might collect pollen.   Rinse hair and eyes before bedtime.

## 2020-11-11 ENCOUNTER — Encounter: Payer: Self-pay | Admitting: Allergy

## 2020-11-11 NOTE — Assessment & Plan Note (Signed)
Broke out in whole body pruritic rash 1 month ago.  This cleared up with 2 rounds of prednisone.  She started 3 new over-the-counter essential oil vitamins about a month and a half prior to this.  No other changes in her medications, diet or personal care products.  History of breaking out in rash/hives with antibiotics. Concerned about allergies triggering this episode.  Reviewed ingredient lists at length for the supplements. Some of the ingredients, I do not have skin testing available.   Today's skin testing was negative to select foods.   Not sure what triggered this rash episode.  Continue to avoid those 3 essential oil multivitamins. Get bloodwork to rule out other etiologies.   Keep track of any other reactions/rash flares - write down what you had come across that day or consumed that day.

## 2020-11-11 NOTE — Assessment & Plan Note (Signed)
History of urticaria/rash mainly with antibiotics. Given history, she may have some underlying urticaria as well.

## 2020-11-11 NOTE — Assessment & Plan Note (Signed)
Usually flares with coughing and chest tightness in the spring and fall for a few weeks requiring albuterol.  Today's spirometry was normal.  May use albuterol rescue inhaler 2 puffs every 4 to 6 hours as needed for shortness of breath, chest tightness, coughing, and wheezing. Monitor frequency of use.

## 2020-11-11 NOTE — Assessment & Plan Note (Signed)
Patient has an extensive list of drug allergies mainly involving antibiotics. Her main reactions are usually rash/hives but there are some that cause perioral blistering - questions reactivation of herpes when taking antibiotics. The erythromycin/tetracyclines caused blistering in the mouth and throat.  Continue to avoid drugs on allergy list.  Recommend penicillin skin testing/drug challenge first.  Will not challenge erythromycin/tetracyclines due to the blistering involving mucous membranes.

## 2020-11-11 NOTE — Assessment & Plan Note (Signed)
   See assessment and plan as above for allergic rhinitis.  

## 2020-11-11 NOTE — Assessment & Plan Note (Signed)
Rhinoconjunctivitis symptoms for 40+ years mainly in the spring and fall.  Using Zyrtec, Flonase and Pataday eyedrops with good benefit.  Patient was on allergy immunotherapy for many years on 2 separate occasions.  Status post sinus surgery as well.  Today's skin testing showed: Positive to grass, ragweed, trees. Borderline to weed.  Start environmental control measures as below.  May use over the counter antihistamines such as Zyrtec (cetirizine), Claritin (loratadine), Allegra (fexofenadine), or Xyzal (levocetirizine) daily as needed.  May use Flonase (fluticasone) nasal spray 1 spray per nostril twice a day as needed for nasal congestion.   May use olopatadine eye drops 0.2% once a day as needed for itchy/watery eyes.

## 2020-11-11 NOTE — Assessment & Plan Note (Signed)
Reaction to shrimp at age 49.  No prior evaluation.  Tolerates finned fish with no issues.  Concerned about dairy, wheat, nuts, pumpkin, corn and tomato allergy as well as they seem to cause diarrhea and bloating.  Today's skin testing showed: Borderline positive to pistachios.   Continue to avoid shellfish given clinical history.   If bloodwork is negative then will do an in office food challenge next.   Most likely has intolerances to dairy, wheat, nuts, tomato, corn and pumpkins. Continue to avoid them as well.   For mild symptoms you can take over the counter antihistamines such as Benadryl and monitor symptoms closely. If symptoms worsen or if you have severe symptoms including breathing issues, throat closure, significant swelling, whole body hives, severe diarrhea and vomiting, lightheadedness then inject epinephrine and seek immediate medical care afterwards.  Emergency action plan given.

## 2020-11-20 LAB — CBC WITH DIFFERENTIAL/PLATELET
Basophils Absolute: 0 10*3/uL (ref 0.0–0.2)
Basos: 0 %
EOS (ABSOLUTE): 0.2 10*3/uL (ref 0.0–0.4)
Eos: 3 %
Hematocrit: 42 % (ref 34.0–46.6)
Hemoglobin: 13.7 g/dL (ref 11.1–15.9)
Immature Grans (Abs): 0 10*3/uL (ref 0.0–0.1)
Immature Granulocytes: 0 %
Lymphocytes Absolute: 1.9 10*3/uL (ref 0.7–3.1)
Lymphs: 31 %
MCH: 28.7 pg (ref 26.6–33.0)
MCHC: 32.6 g/dL (ref 31.5–35.7)
MCV: 88 fL (ref 79–97)
Monocytes Absolute: 0.4 10*3/uL (ref 0.1–0.9)
Monocytes: 6 %
Neutrophils Absolute: 3.6 10*3/uL (ref 1.4–7.0)
Neutrophils: 60 %
Platelets: 390 10*3/uL (ref 150–450)
RBC: 4.78 x10E6/uL (ref 3.77–5.28)
RDW: 13.1 % (ref 11.7–15.4)
WBC: 6.1 10*3/uL (ref 3.4–10.8)

## 2020-11-20 LAB — SEDIMENTATION RATE: Sed Rate: 8 mm/hr (ref 0–40)

## 2020-11-20 LAB — ALLERGEN GUM CARAGEENAN
Carageenan Gum, IgE: <0.35 kU/L (ref <0.35)
Class Interpretation: 0

## 2020-11-20 LAB — COMPREHENSIVE METABOLIC PANEL
ALT: 25 IU/L (ref 0–32)
AST: 25 IU/L (ref 0–40)
Albumin/Globulin Ratio: 1.6 (ref 1.2–2.2)
Albumin: 4.7 g/dL (ref 3.8–4.8)
Alkaline Phosphatase: 98 IU/L (ref 44–121)
BUN/Creatinine Ratio: 25 — ABNORMAL HIGH (ref 9–23)
BUN: 20 mg/dL (ref 6–24)
Bilirubin Total: <0.2 mg/dL (ref 0.0–1.2)
CO2: 26 mmol/L (ref 20–29)
Calcium: 10 mg/dL (ref 8.7–10.2)
Chloride: 103 mmol/L (ref 96–106)
Creatinine, Ser: 0.79 mg/dL (ref 0.57–1.00)
GFR calc Af Amer: 101 mL/min/{1.73_m2} (ref >59)
GFR calc non Af Amer: 88 mL/min/{1.73_m2} (ref >59)
Globulin, Total: 3 g/dL (ref 1.5–4.5)
Glucose: 94 mg/dL (ref 65–99)
Potassium: 4.8 mmol/L (ref 3.5–5.2)
Sodium: 142 mmol/L (ref 134–144)
Total Protein: 7.7 g/dL (ref 6.0–8.5)

## 2020-11-20 LAB — ALLERGEN PROFILE, SHELLFISH
Clam IgE: <0.1 kU/L
F023-IgE Crab: <0.1 kU/L
F080-IgE Lobster: <0.1 kU/L
F290-IgE Oyster: <0.1 kU/L
Scallop IgE: <0.1 kU/L
Shrimp IgE: <0.1 kU/L

## 2020-11-20 LAB — C-REACTIVE PROTEIN: CRP: 2 mg/L (ref 0–10)

## 2020-11-20 LAB — ANA W/REFLEX: Anti Nuclear Antibody (ANA): NEGATIVE

## 2020-11-20 LAB — LATEX, IGE: Latex IgE: <0.1 kU/L

## 2020-11-20 LAB — ALPHA-GAL PANEL
Allergen Lamb IgE: <0.1 kU/L
Beef IgE: <0.1 kU/L
IgE (Immunoglobulin E), Serum: 70 IU/mL (ref 6–495)
O215-IgE Alpha-Gal: <0.1 kU/L
Pork IgE: <0.1 kU/L

## 2020-11-20 LAB — CHRONIC URTICARIA: cu index: 3.3 (ref <10)

## 2020-11-20 LAB — TRYPTASE: Tryptase: 4.4 ug/L (ref 2.2–13.2)

## 2020-11-20 LAB — THYROID CASCADE PROFILE: TSH: 2 u[IU]/mL (ref 0.450–4.500)

## 2020-12-01 ENCOUNTER — Ambulatory Visit: Payer: BC Managed Care – PPO | Admitting: Medical

## 2020-12-02 ENCOUNTER — Other Ambulatory Visit: Payer: Self-pay | Admitting: Obstetrics & Gynecology

## 2020-12-10 ENCOUNTER — Telehealth: Payer: Self-pay | Admitting: Allergy

## 2020-12-10 MED ORDER — AMOXICILLIN 250 MG/5ML PO SUSR: ORAL | 0 refills | Status: DC

## 2020-12-10 NOTE — Telephone Encounter (Signed)
Please call patient to confirm drug challenge for penicillin next week on 1/25.  Must be off antihistamines for 3-5 days before. Must be in good health and not ill. No vaccines/injections within the past 7 days. Not on any antibiotics.   Plan on being in the office for 2-3 hours and must bring in the medication you want to do the oral challenge for - liquid amoxicillin.   ASK what pharmacy they want me to send this to. Must bring to the appointment and keep in the fridge.   Thank you.

## 2020-12-10 NOTE — Addendum Note (Signed)
Addended by: Garnet Sierras on: 12/10/2020 12:50 PM   Modules accepted: Orders

## 2020-12-10 NOTE — Telephone Encounter (Signed)
Patient has been exposed to Covid. She will take a test on Monday at [facility] and call me to give me results. She needs her prescription sent into [facility] on [address] in [place].

## 2020-12-14 NOTE — Progress Notes (Signed)
Follow Up Note  RE: Hailey Wilkins MRN: [id number] DOB: [birth year] Date of Office Visit: 12/15/2020  Referring provider: Ebbie Ridge, MD Primary care provider: Ebbie Ridge, MD  Chief Complaint: Hailey Wilkins Challenge (PCN Challenge)  Assessment and Plan: Geri is a 50 y.o. female with: Drug reaction Past history - Patient has an extensive list of drug allergies mainly involving antibiotics. Her main reactions are usually rash/hives but there are some that cause perioral blistering - questions reactivation of herpes when taking antibiotics. The erythromycin/tetracyclines caused blistering in the mouth and throat. Interim history - negative skin and intradermal testing to penicillin and pre-pen.   Tolerated 54mL of amoxicillin 250mg /8mL.  Patient noted posterior neck and scalp pruritus 50 minutes after taking 43mL of amoxicillin 250mg /54mL.  This is similar in nature when she has issues with antibiotics.  Benadryl 25mg  given. 15 minutes afterwards patient c/o itching on her legs as well.  Prednisolone 30mg  given. Monitored for 45 minutes   Vitals stable throughout and pruritus slowly improving.  No rashes noted apart from areas where patient was scratching. No hives.   Failed oral amoxicillin challenge today.   For next 24 hours monitor for hives, swelling, shortness of breath and dizziness. If you see these symptoms, use Benadryl for mild symptoms and epinephrine for more severe symptoms and call 911.  Continue to avoid penicillin type antibiotics.   Will not challenge erythromycin/tetracyclines due to the blistering involving mucous membranes.  Recommend sulfa challenge next, then possibly Levaquin.   Followed up on patient in the afternoon - doing well, itching mostly resolved, just feeling tired.   Adverse reaction to food, subsequent encounter Past history - Reaction to shrimp at age 70.  No prior evaluation.  Tolerates finned fish with no issues.   Concerned about dairy, wheat, nuts, pumpkin, corn and tomato allergy as well as they seem to cause diarrhea and bloating. 2021 skin testing showed: Borderline positive to pistachios.  Interim history - 2021 bloodwork negative to shrimp.  Continue to avoid shellfish given clinical history.   If interested we can schedule food challenge to shrimp. You must be off antihistamines for 3-5 days before. Must be in good health and not ill. No vaccines/injections within the past 7 days. Not on any antibiotics. Plan on being in the office for 2-3 hours and must bring in the food you want to do the oral challenge for. You must call to schedule an appointment and specify it's for a food challenge.   Return for Drug challenge. - sulfa.   Plan: Challenge drug: amoxicillin Challenge as per protocol: Failed Total time: 160 minutes  History of Present Illness: I had the pleasure of seeing Hailey Wilkins for a follow up visit at [facility] [place] on 12/15/2020. She is a 50 y.o. female, who is being followed for rash, multiple drug allergies, urticaria, asthma, allergic rhinoconjunctivitis, adverse food reaction. Her previous allergy office visit was on 11/10/2020 with Dr. Maudie Mercury. Today she is here for penicillin drug testing and challenge.   History of Reaction: Amoxicillin - rash and welts, ? blistering around her mouth but not in her oral mucosa. The symptoms usually appear at the end of her treatment regimen. This happened many years ago. Patient had amoxicillin previously without any issues.  She had Augmentin initially for a sinus infection and broke out in a rash at the end of her regimen so then she was given amoxicillin and had same issues.   Patient c/o  if posterior neck and scalp itching about 50 minutes after 58mL of amoxicillin given.  Vitals stable. Benadryl 25mg  given. 15 minutes afterwards patient c/o itching on her legs as well. Prednisolone 30mg  given. Monitored for 45 minutes   Vitals stable.   Interval History: Patient has not been ill, she has not had any accidental exposures to the culprit medication.   Recent/Current History: Pulmonary disease: yes - asthma controlled.  Cardiac disease: no Respiratory infection: no Rash: no Itch: no Swelling: no Cough: no Shortness of breath: no Runny/stuffy nose: since off antihistamines Itchy eyes: since off antihsitamines Beta-blocker use: yes  Patient/guardian was informed of the test procedure with verbalized understanding of the risk of anaphylaxis. Consent was signed.   Last antihistamine use: none in the past 3 days Last beta-blocker use: took it last night.  Medication List:  Current Outpatient Medications  Medication Sig Dispense Refill   amoxicillin (AMOXIL) 250 MG/5ML suspension Do NOT take at home. Bring to Dr. Julianne Rice office for drug challenge. 80 mL 0   cetirizine (ZYRTEC) 10 MG tablet Take 10 mg by mouth daily.     EPINEPHrine 0.3 mg/0.3 mL IJ SOAJ injection Inject 0.3 mg into the muscle as needed for anaphylaxis. 1 each 1   fluticasone (FLONASE) 50 MCG/ACT nasal spray Place 2 sprays into both nostrils daily.      metoprolol succinate (TOPROL-XL) 50 MG 24 hr tablet Take 0.5 tablets (25 mg total) by mouth daily. 90 tablet 3   PARoxetine (PAXIL) 10 MG tablet 1 1/2 tablets daily. 135 tablet 4   valACYclovir (VALTREX) 1000 MG tablet Take 2 tabs and repeat in 12 hours as needed for cold sores. 90 tablet 1   VENTOLIN HFA 108 (90 Base) MCG/ACT inhaler INHALE 2 PUFFS INTO THE LUNGS EVERY 6 HOURS AS NEEDED FOR WHEEZING OR SHORTNESS OF BREATH. (Patient taking differently: Inhale 2 puffs into the lungs every 6 (six) hours as needed for wheezing or shortness of breath.) 18 Inhaler 0   Multiple Vitamins-Minerals (MULTIVITAMIN ADULT PO) Take 1 tablet by mouth daily.     No current facility-administered medications for this visit.   Allergies: Allergies  Allergen Reactions   Amoxicillin Anaphylaxis    Ceftriaxone Anaphylaxis   Erythromycin Anaphylaxis   Levofloxacin Anaphylaxis   Rocephin [Ceftriaxone Sodium In Dextrose] Anaphylaxis   Shellfish Allergy Anaphylaxis   Tetracyclines & Related Anaphylaxis   Amoxicillin-Pot Clavulanate Hives   Clindamycin/Lincomycin Hives   Codeine Hives   Latex    Sulfa Antibiotics Hives   Vancomycin Rash   I reviewed her past medical history, social history, family history, and environmental history and no significant changes have been reported from her previous visit.  Review of Systems  Constitutional: Negative for appetite change, chills, fever and unexpected weight change.  HENT: Negative for congestion and rhinorrhea.   Eyes: Negative for itching.  Respiratory: Negative for cough, chest tightness, shortness of breath and wheezing.   Cardiovascular: Negative for chest pain.  Gastrointestinal: Negative for abdominal pain.  Genitourinary: Negative for difficulty urinating.  Skin: Negative for rash.  Allergic/Immunologic: Positive for environmental allergies.  Neurological: Negative for headaches.   Objective: BP 110/72 (BP Location: Left Arm, Patient Position: Sitting, Cuff Size: Normal)    Pulse 76    Resp 18    LMP 02/10/2018    SpO2 98%  There is no height or weight on file to calculate BMI. Physical Exam Vitals and nursing note reviewed.  Constitutional:      Appearance: Normal appearance. She  is well-developed.  HENT:     Head: Normocephalic and atraumatic.     Right Ear: External ear normal.     Left Ear: External ear normal.     Nose: Nose normal.     Mouth/Throat:     Mouth: Mucous membranes are moist.     Pharynx: Oropharynx is clear.  Eyes:     Conjunctiva/sclera: Conjunctivae normal.  Cardiovascular:     Rate and Rhythm: Normal rate and regular rhythm.     Heart sounds: Normal heart sounds. No murmur heard. No friction rub. No gallop.   Pulmonary:     Effort: Pulmonary effort is normal.     Breath sounds:  Normal breath sounds. No wheezing, rhonchi or rales.  Musculoskeletal:     Cervical back: Neck supple.  Skin:    General: Skin is warm.     Findings: No rash.  Neurological:     Mental Status: She is alert and oriented to person, place, and time.  Psychiatric:        Behavior: Behavior normal.     Diagnostics: Results discussed with patient/family.  Oral Challenge - 12/15/20 1400    Challenge Food/Drug Amoxicillin    Lot #  if Applicable [id number]    Food/Drug provided by Patient    Time 1050    Dose 1 ml    Time 1108    Dose 72ml          Penicillin - 12/15/20 0951    Manufacturer [employer]    Lot # [id number]    Location Arm    Number of allergen test 8    Time Testing Placed 0930    Control SPT Negative    Histamine SPT 2+    Pre-Pen Puncture Negative    Penicillin-G 5000 u/ml SPT Negative    Select Select    Time Testing Placed 1015    Control Intradermal Negative    Pre-Pen Intradermal Negative    Penicillin-G 50 u/ml Intradermal Negative    Time Testing Placed 1032    Penicillin-G 5000 u/ml Intradermal Negative           Previous notes and tests were reviewed. The plan was reviewed with the patient/family, and all questions/concerned were addressed.  It was my pleasure to see Hailey Wilkins today and participate in her care. Please feel free to contact me with any questions or concerns.  Sincerely,  Rexene Alberts, DO Allergy & Immunology  [facility] office: [place] office: [phone]

## 2020-12-15 ENCOUNTER — Ambulatory Visit: Payer: BC Managed Care – PPO | Admitting: Allergy

## 2020-12-15 ENCOUNTER — Encounter: Payer: Self-pay | Admitting: Allergy

## 2020-12-15 ENCOUNTER — Other Ambulatory Visit: Payer: Self-pay

## 2020-12-15 ENCOUNTER — Telehealth: Payer: Self-pay

## 2020-12-15 VITALS — BP 110/72 | HR 76 | Resp 18

## 2020-12-15 DIAGNOSIS — Z889 Allergy status to unspecified drugs, medicaments and biological substances status: Secondary | ICD-10-CM | POA: Diagnosis not present

## 2020-12-15 DIAGNOSIS — R21 Rash and other nonspecific skin eruption: Secondary | ICD-10-CM

## 2020-12-15 DIAGNOSIS — S00522D Blister (nonthermal) of oral cavity, subsequent encounter: Secondary | ICD-10-CM | POA: Diagnosis not present

## 2020-12-15 DIAGNOSIS — T50905D Adverse effect of unspecified drugs, medicaments and biological substances, subsequent encounter: Secondary | ICD-10-CM

## 2020-12-15 DIAGNOSIS — T50905A Adverse effect of unspecified drugs, medicaments and biological substances, initial encounter: Secondary | ICD-10-CM | POA: Insufficient documentation

## 2020-12-15 DIAGNOSIS — T781XXD Other adverse food reactions, not elsewhere classified, subsequent encounter: Secondary | ICD-10-CM

## 2020-12-15 NOTE — Telephone Encounter (Signed)
Called to follow up with patient after her Penicillin Challenge in the office this morning. She states she is doing pretty good, not a lot of itching just feeling a little sleepy.

## 2020-12-15 NOTE — Patient Instructions (Addendum)
Today's skin testing was negative to penicillin and its components.  You received a total of amoxicillin 500mg  today.   You received 25mg  of liquid benadryl.  You received 30mg  of liquid prednisone.   May take benadryl 25mg  every 4-6 hours as needed.   For next 24 hours monitor for hives, swelling, shortness of breath and dizziness. If you see these symptoms, use Benadryl for mild symptoms and epinephrine for more severe symptoms and call 911.  Continue to avoid penicillin type antibiotics.   Continue to avoid those 3 essential oil multivitamins.  Will not challenge erythromycin/tetracyclines due to the blistering involving mucous membranes.  Follow up for sulfa or shrimp challenge next.  For Sulfa challenge: You must be off antihistamines for 3-5 days before. Must be in good health and not ill. Plan on being in the office for 2-3 hours and must bring in the drug you want to do the oral challenge for - will send in prescription to pick up a few days before. You must call to schedule an appointment and specify it's for a drug challenge.   For shrimp challenge:  You must be off antihistamines for 3-5 days before. Must be in good health and not ill. No vaccines/injections within the past 7 days. Not on any antibiotics. Plan on being in the office for 2-3 hours and must bring in the food you want to do the oral challenge for - sulfa or shrimp (lightly seasoned, bring 1 serving size). You must call to schedule an appointment and specify it's for a food challenge.

## 2020-12-15 NOTE — Assessment & Plan Note (Addendum)
Past history - Patient has an extensive list of drug allergies mainly involving antibiotics. Her main reactions are usually rash/hives but there are some that cause perioral blistering - questions reactivation of herpes when taking antibiotics. The erythromycin/tetracyclines caused blistering in the mouth and throat. Interim history - negative skin and intradermal testing to penicillin and pre-pen.   Tolerated 49mL of amoxicillin 250mg /40mL.  Patient noted posterior neck and scalp pruritus 50 minutes after taking 30mL of amoxicillin 250mg /33mL.  This is similar in nature when she has issues with antibiotics.  Benadryl 25mg  given. 15 minutes afterwards patient c/o itching on her legs as well.  Prednisolone 30mg  given. Monitored for 45 minutes   Vitals stable throughout and pruritus slowly improving.  No rashes noted apart from areas where patient was scratching. No hives.   Failed oral amoxicillin challenge today.   For next 24 hours monitor for hives, swelling, shortness of breath and dizziness. If you see these symptoms, use Benadryl for mild symptoms and epinephrine for more severe symptoms and call 911.  Continue to avoid penicillin type antibiotics.   Will not challenge erythromycin/tetracyclines due to the blistering involving mucous membranes.  Recommend sulfa challenge next, then possibly Levaquin.   Followed up on patient in the afternoon - doing well, itching mostly resolved, just feeling tired.

## 2020-12-15 NOTE — Assessment & Plan Note (Signed)
Past history - Reaction to shrimp at age 50.  No prior evaluation.  Tolerates finned fish with no issues.  Concerned about dairy, wheat, nuts, pumpkin, corn and tomato allergy as well as they seem to cause diarrhea and bloating. 2021 skin testing showed: Borderline positive to pistachios.  Interim history - 2021 bloodwork negative to shrimp.  Continue to avoid shellfish given clinical history.   If interested we can schedule food challenge to shrimp. You must be off antihistamines for 3-5 days before. Must be in good health and not ill. No vaccines/injections within the past 7 days. Not on any antibiotics. Plan on being in the office for 2-3 hours and must bring in the food you want to do the oral challenge for. You must call to schedule an appointment and specify it's for a food challenge.

## 2021-01-01 ENCOUNTER — Ambulatory Visit (HOSPITAL_BASED_OUTPATIENT_CLINIC_OR_DEPARTMENT_OTHER): Payer: BC Managed Care – PPO | Admitting: Obstetrics & Gynecology

## 2021-01-07 ENCOUNTER — Encounter: Payer: BC Managed Care – PPO | Admitting: Allergy

## 2021-01-14 ENCOUNTER — Other Ambulatory Visit: Payer: Self-pay

## 2021-01-14 ENCOUNTER — Ambulatory Visit (INDEPENDENT_AMBULATORY_CARE_PROVIDER_SITE_OTHER): Payer: BC Managed Care – PPO | Admitting: Obstetrics & Gynecology

## 2021-01-14 ENCOUNTER — Encounter (HOSPITAL_BASED_OUTPATIENT_CLINIC_OR_DEPARTMENT_OTHER): Payer: Self-pay | Admitting: Obstetrics & Gynecology

## 2021-01-14 ENCOUNTER — Other Ambulatory Visit (HOSPITAL_COMMUNITY)
Admission: RE | Admit: 2021-01-14 | Discharge: 2021-01-14 | Disposition: A | Discharge status: Home / Self Care | Payer: BC Managed Care – PPO | Source: Ambulatory Visit | Attending: Obstetrics & Gynecology | Admitting: Obstetrics & Gynecology

## 2021-01-14 VITALS — BP 119/83 | HR 94 | Ht 61.75 in | Wt 158.8 lb

## 2021-01-14 DIAGNOSIS — R4586 Emotional lability: Secondary | ICD-10-CM | POA: Diagnosis not present

## 2021-01-14 DIAGNOSIS — Z803 Family history of malignant neoplasm of breast: Secondary | ICD-10-CM

## 2021-01-14 DIAGNOSIS — Z124 Encounter for screening for malignant neoplasm of cervix: Secondary | ICD-10-CM | POA: Diagnosis not present

## 2021-01-14 DIAGNOSIS — Z01419 Encounter for gynecological examination (general) (routine) without abnormal findings: Secondary | ICD-10-CM

## 2021-01-14 DIAGNOSIS — K069 Disorder of gingiva and edentulous alveolar ridge, unspecified: Secondary | ICD-10-CM

## 2021-01-14 DIAGNOSIS — B009 Herpesviral infection, unspecified: Secondary | ICD-10-CM

## 2021-01-14 MED ORDER — VALACYCLOVIR HCL 1 G PO TABS: ORAL_TABLET | ORAL | 1 refills | Status: DC

## 2021-01-14 NOTE — Progress Notes (Signed)
50 y.o. G2P2 Married White or Caucasian female here for annual exam.  Doing well.  Had colonoscopy this past summer.  Working on weight loss.  Down 23# from last visit with me.  Last HbA1C 5.8.    Tyrer Cusick model recalculated at last visit after negative genetic testing and was 17.5% lifetime risk.  Limited breast MRI has been discussed.  Patient's last menstrual period was 02/10/2018.          Sexually active: Yes.    The current method of family planning is post menopausal status.    Exercising: yes, walking Smoker:  no  Health Maintenance: Pap:  2019 neg, 2017 neg with neg HR HPV History of abnormal Pap:  Remote hx, resolved  MMG:  09/2020 Colonoscopy:  07/21/2020, polyp Dr. Haig Prophet Jefm Bryant), follow up 10 years.  Documentation about polyp pathology on 07/31/2020 BMD:   Not indicated TDaP:  07/16/2018 Pneumonia vaccine(s):  Not indicated Shingrix:   reviewed with pt Hep C testing: 12/14/2020 in care everywhere Screening Labs: with Dr. Ola Spurr.  Had HbA1C done.     reports that she has never smoked. She has never used smokeless tobacco. She reports current alcohol use of about 5.0 standard drinks of alcohol per week. She reports that she does not use drugs.  Past Medical History:  Diagnosis Date  . Allergic rhinitis    Dr. Doreene Nest, s/p allergy testing  . Anemia   . Asthma   . Basal cell carcinoma of anterior chest    removed  . Breast cyst 2011   bx benign  . Complication of anesthesia   . Depression    after father died, on [place] in past  . Family history of breast cancer   . Family history of pancreatic cancer   . Family history of stomach cancer   . GERD (gastroesophageal reflux disease)   . Gestational diabetes    with first child  . Headache(784.0)   . History of chicken pox   . Hypertension   . Inappropriate sinus node tachycardia   . Internal hemorrhoid 2006   dx during colon w/Dr Tiffany Kocher  . Migraines    associated with menses in past, tx with excedrin,  may last up to 1 week  . Migraines   . Pericarditis    a. 10/2012 ETT: Ex time 9:00, No ST/T changes; b. 10/2012 Echo: EF 55-60%, no rwma, no pericardial effusion.  Marland Kitchen PONV (postoperative nausea and vomiting)    surgery in 2017 at [place], she had no n/v  . Shingles 04/2011  . Urticaria 11/10/2020  . UTI (lower urinary tract infection)     Past Surgical History:  Procedure Laterality Date  . ANTERIOR CERVICAL DECOMP/DISCECTOMY FUSION N/A 06/12/2019   Procedure: ANTERIOR CERVICAL DECOMPRESSION/DISCECTOMY FUSION CERVICAL FOUR- CERVICAL FIVE, CERVICAL FIVE- CERVICAL SIX, CERVICAL SIX- CERVICAL SEVEN;  Surgeon: Jovita Gamma, MD;  Location: [place];  Service: Neurosurgery;  Laterality: N/A;  ANTERIOR CERVICAL DECOMPRESSION/DISCECTOMY FUSION CERVICAL 4- CERVICAL 5, CERVICAL 5- CERVICAL 6, CERVICAL 6- CERVICAL7  . BASAL CELL CARCINOMA EXCISION  2011   [facility]oved [facility] chest/[place] [facility]  . BREAST BIOPSY Right 07/25/2018   stereo bx coil clip- CYSTIC PAPILLARY APOCRINE METAPLASIA  . CHOLECYSTECTOMY  2010   [facility]  Marland Kitchen COLONOSCOPY    . KNEE ARTHROSCOPY W/ MENISCAL REPAIR  1989   Right, Dr. Mauri Pole  . NASAL SINUS SURGERY  1999, 2005  . REDUCTION MAMMAPLASTY  2017  . TONSILLECTOMY  1988    Current  Outpatient Medications  Medication Sig Dispense Refill  . cetirizine (ZYRTEC) 10 MG tablet Take 10 mg by mouth daily.    Marland Kitchen EPINEPHrine 0.3 mg/0.3 mL IJ SOAJ injection Inject 0.3 mg into the muscle as needed for anaphylaxis. 1 each 1  . fluticasone (FLONASE) 50 MCG/ACT nasal spray Place 2 sprays into both nostrils daily.     . metoprolol succinate (TOPROL-XL) 50 MG 24 hr tablet Take 0.5 tablets (25 mg total) by mouth daily. 90 tablet 3  . PARoxetine (PAXIL) 10 MG tablet 1 1/2 tablets daily. 135 tablet 4  . valACYclovir (VALTREX) 1000 MG tablet Take 2 tabs and repeat in 12 hours as needed for cold sores. 90 tablet 1  . VENTOLIN HFA 108 (90 Base) MCG/ACT inhaler INHALE 2  PUFFS INTO THE LUNGS EVERY 6 HOURS AS NEEDED FOR WHEEZING OR SHORTNESS OF BREATH. (Patient taking differently: Inhale 2 puffs into the lungs every 6 (six) hours as needed for wheezing or shortness of breath.) 18 Inhaler 0   No current facility-administered medications for this visit.    Family History  Problem Relation Age of Onset  . Hypertension Mother   . Cancer Mother 66       Breast Ca  . Arthritis Mother   . Breast cancer Mother 34       2nd primary  . Cancer Father 52       Pancreatic  . Diabetes Father   . Hypertension Father   . Hyperlipidemia Father   . Heart disease Father   . Cancer Sister 4       Thyroid  . Cancer Maternal Grandmother        might have had cancer, died at 4  . Alcohol abuse Maternal Grandfather   . Pancreatic cancer Maternal Grandfather 69       died in his 5's  . Alcohol abuse Paternal Grandfather   . Heart disease Paternal Grandfather   . Hyperlipidemia Paternal Grandfather   . Hypertension Paternal Grandfather   . Other Paternal Grandmother        brain tumor on pitutary gland  . Brain cancer Paternal Grandmother 15       pituitary tumor, died in her 18's  . Breast cancer Cousin 25       maternal couisn, is now in her 74's  . Stomach cancer Maternal Aunt 75       is now 72  . Stomach cancer Maternal Aunt 75       died in 27's  . Breast cancer Cousin 67       maternal cousin, is now 23    Review of Systems  All other systems reviewed and are negative.   Exam:   BP 119/83   Pulse 94   Ht 5' 1.75" (1.568 m)   Wt 158 lb 12.8 oz (72 kg)   LMP 02/10/2018   BMI 29.28 kg/m   Height: 5' 1.75" (156.8 cm)  General appearance: alert, cooperative and appears stated age Head: Normocephalic, without obvious abnormality, atraumatic Neck: no adenopathy, supple, symmetrical, trachea midline and thyroid normal to inspection and palpation Lungs: clear to auscultation bilaterally Breasts: normal appearance, no masses or tenderness Heart:  regular rate and rhythm Abdomen: soft, non-tender; bowel sounds normal; no masses,  no organomegaly Extremities: extremities normal, atraumatic, no cyanosis or edema Skin: Skin color, texture, turgor normal. No rashes or lesions Lymph nodes: Cervical, supraclavicular, and axillary nodes normal. No abnormal inguinal nodes palpated Neurologic: Grossly normal   Pelvic: External  genitalia:  no lesions              Urethra:  normal appearing urethra with no masses, tenderness or lesions              Bartholins and Skenes: normal                 Vagina: normal appearing vagina with normal color and discharge, no lesions              Cervix: no lesions              Pap taken: Yes.   Bimanual Exam:  Uterus:  normal size, contour, position, consistency, mobility, non-tender              Adnexa: normal adnexa and no mass, fullness, tenderness               Rectovaginal: Confirms               Anus:  normal sphincter tone, no lesions  Chaperone, Shela Nevin, RN, was present for exam.  Assessment/Plan: 1. Well woman exam with routine gynecological exam - pap and HR HPV obtained today - MMG 09/2020 - colonoscopy 2021, follow up 10 years - lab work done with Dr. Ola Spurr - vaccines up to date.  Shingrix vaccination discussed.    2. Emotional lability - on paxil 10mg , taking 1/2 tab per day.  Declines need for RF.   3. 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  Dispense: 30 tablet; Refill: 1  4. Family history of breast cancer - has negative genetic testing - limited breast MRI have been discussed

## 2021-01-15 ENCOUNTER — Ambulatory Visit: Payer: BC Managed Care – PPO

## 2021-01-18 LAB — CYTOLOGY - PAP
Comment: NEGATIVE
Diagnosis: NEGATIVE
Diagnosis: REACTIVE
High risk HPV: NEGATIVE

## 2021-01-25 ENCOUNTER — Telehealth: Payer: Self-pay | Admitting: Allergy

## 2021-01-25 MED ORDER — SULFAMETHOXAZOLE-TRIMETHOPRIM 200-40 MG/5ML PO SUSP: ORAL | 0 refills | Status: DC

## 2021-01-25 NOTE — Telephone Encounter (Signed)
Please call patient to confirm drug challenge for sulfa on 3/10.  Must be off antihistamines for 3-5 days before. Must be in good health and not ill. No vaccines/injections within the past 7 days. Not on any antibiotics.   Plan on being in the office for 2-3 hours and must bring in the medication you want to do the oral challenge for - sulfa. Sent in Rx to pharmacy. Keep in refrigerator.  Thank you.

## 2021-01-26 NOTE — Telephone Encounter (Signed)
Called and spoke to patient and she informed me that she had to cancel her appointment due to her being ill at the moment.   Patient stated that she rescheduled her appoint ment for the 31 of March. Patient is aware that she is to be in the office for up to 3 hours and that she should stay off of any antihistamines 3 to 5 days prior to her appointment. Patient does not have her antibiotics and stated that she did not pick up her prescription due to the pharmacy not contacting her to tell her that the prescription is ready for pick up.

## 2021-01-28 ENCOUNTER — Encounter: Payer: BC Managed Care – PPO | Admitting: Allergy

## 2021-02-18 ENCOUNTER — Encounter: Payer: Self-pay | Admitting: Allergy

## 2021-02-18 ENCOUNTER — Ambulatory Visit: Payer: BC Managed Care – PPO | Admitting: Allergy

## 2021-02-18 ENCOUNTER — Telehealth: Payer: Self-pay | Admitting: Allergy

## 2021-02-18 ENCOUNTER — Other Ambulatory Visit: Payer: Self-pay

## 2021-02-18 VITALS — BP 114/80 | HR 68 | Resp 16

## 2021-02-18 DIAGNOSIS — Z888 Allergy status to other drugs, medicaments and biological substances status: Secondary | ICD-10-CM | POA: Diagnosis not present

## 2021-02-18 DIAGNOSIS — T50905D Adverse effect of unspecified drugs, medicaments and biological substances, subsequent encounter: Secondary | ICD-10-CM | POA: Diagnosis not present

## 2021-02-18 DIAGNOSIS — T781XXD Other adverse food reactions, not elsewhere classified, subsequent encounter: Secondary | ICD-10-CM | POA: Diagnosis not present

## 2021-02-18 DIAGNOSIS — Z889 Allergy status to unspecified drugs, medicaments and biological substances status: Secondary | ICD-10-CM

## 2021-02-18 NOTE — Telephone Encounter (Signed)
Please call patient and see how she is doing after her sulfa drug challenge - she had itching which was improving.  Thank you.

## 2021-02-18 NOTE — Patient Instructions (Addendum)
Failed Bactrim challenge.   You received benadryl 50mg  and prednisolone 60mg  today.  Continue to avoid those 3 essential oil multivitamins. Continue to avoid medications on your allergy list. Will not challenge erythromycin/tetracyclines due to the blistering involving mucous membranes.  Consider Levaquin challenge next - schedule when you are able to come off antihistamines (summer or winter months)  If interested we can schedule drug challenge to Levaquin next. You must be off antihistamines for 3-5 days before. Must be in good health and not ill. Plan on being in the office for 2-3 hours and must bring in the drug you want to do the oral challenge for - will send in prescription to pick up a few days before. You must call to schedule an appointment and specify it's for a drug challenge.   Food:  Continue to avoid shellfish.  For shrimp challenge: You must be off antihistamines for 3-5 days before. Must be in good health and not ill. No vaccines/injections within the past 7 days. Not on any antibiotics. Plan on being in the office for 2-3 hours and must bring in the food you want to do the oral challenge for - shrimp (lightly seasoned, bring 1 serving size). You must call to schedule an appointment and specify it's for a food challenge.  Follow up as needed for drug/food challenges.

## 2021-02-18 NOTE — Assessment & Plan Note (Signed)
Past history - Patient has an extensive list of drug allergies mainly involving antibiotics. Her main reactions are usually rash/hives but there are some that cause perioral blistering - questions reactivation of herpes when taking antibiotics. The erythromycin/tetracyclines caused blistering in the mouth and throat. Failed in office oral penicillin challenge (pruritus); negative skin testing. Interim history - patient developed forehead pruritus and tightness around her eyes after receiving her second dose of Bactrim (10mg ).  Treated with Benadryl 25mg  + 25mg  and prednisolone 60mg . Symptoms improved after 30 minutes and vitals stable. No rash/hives/swelling noted.  Failed Bactrim challenge.   Continue to avoid those 3 essential oil multivitamins.  Continue to avoid medications on allergy list - including penicillin and bactrim as she had failed in office drug challenges.   Will not challenge erythromycin/tetracyclines due to the blistering involving mucous membranes.  Consider Levaquin challenge next - schedule when you are able to come off antihistamines (summer or winter months)

## 2021-02-18 NOTE — Assessment & Plan Note (Signed)
Past history - Reaction to shrimp at age 50.  No prior evaluation.  Tolerates finned fish with no issues.  Concerned about dairy, wheat, nuts, pumpkin, corn and tomato allergy as well as they seem to cause diarrhea and bloating. 2021 skin testing showed: Borderline positive to pistachios. 2021 bloodwork negative to shrimp.  Continue to avoid shellfish given clinical history.   If interested we can schedule food challenge to shrimp.

## 2021-02-18 NOTE — Progress Notes (Signed)
Follow Up Note  RE: Hailey Wilkins MRN: [id number] DOB: December 14, 1970 Date of Office Visit: 02/18/2021  Referring provider: Ebbie Ridge, MD Primary care provider: Ebbie Ridge, MD  Chief Complaint: Food/Drug Challenge  Assessment and Plan: Hailey Wilkins is a 50 y.o. female with: Multiple drug allergies Past history - Patient has an extensive list of drug allergies mainly involving antibiotics. Her main reactions are usually rash/hives but there are some that cause perioral blistering - questions reactivation of herpes when taking antibiotics. The erythromycin/tetracyclines caused blistering in the mouth and throat. Failed in office oral penicillin challenge (pruritus); negative skin testing. Interim history - patient developed forehead pruritus and tightness around her eyes after receiving her second dose of Bactrim (10mg ).  Treated with Benadryl 25mg  + 25mg  and prednisolone 60mg . Symptoms improved after 30 minutes and vitals stable. No rash/hives/swelling noted.  Failed Bactrim challenge.   Continue to avoid those 3 essential oil multivitamins.  Continue to avoid medications on allergy list - including penicillin and bactrim as she had failed in office drug challenges.   Will not challenge erythromycin/tetracyclines due to the blistering involving mucous membranes.  Consider Levaquin challenge next - schedule when you are able to come off antihistamines (summer or winter months)  Adverse reaction to food, subsequent encounter Past history - Reaction to shrimp at age 43.  No prior evaluation.  Tolerates finned fish with no issues.  Concerned about dairy, wheat, nuts, pumpkin, corn and tomato allergy as well as they seem to cause diarrhea and bloating. 2021 skin testing showed: Borderline positive to pistachios. 2021 bloodwork negative to shrimp.  Continue to avoid shellfish given clinical history.   If interested we can schedule food challenge to shrimp.   Return if  symptoms worsen or fail to improve.  Plan: Challenge drug: Bactrim Challenge as per protocol: Failed Total time: 102 min  History of Present Illness: I had the pleasure of seeing Hailey Wilkins for a follow up visit at [facility] on 02/18/2021. She is a 50 y.o. female, who is being followed for adverse drug reaction and adverse food reaction. Her previous allergy office visit was on 12/15/2020 with Dr. Maudie Mercury. Today she is here for Bactrim drug challenge.   History of Reaction: Patient had a reaction to bactrim as a child around age 84 and she broke out hives. Patient was treated as outpatient for an ear infection. No bactrim since then.   Interval History: Patient has not been ill, she has not had any accidental exposures to the culprit medication.   Recent/Current History: Pulmonary disease:  Some chest tightness and used albuterol last Sunday.  Cardiac disease: no Respiratory infection: no Rash: no Itch: no Swelling: no Cough: no Shortness of breath: no Runny/stuffy nose: yes Itchy eyes: no Beta-blocker use: yes  Patient/guardian was informed of the test procedure with verbalized understanding of the risk of anaphylaxis. Consent was signed.   Last antihistamine use: none in the last 3 days Last beta-blocker use: this morning  Medication List:  Current Outpatient Medications  Medication Sig Dispense Refill  . cetirizine (ZYRTEC) 10 MG tablet Take 10 mg by mouth daily.    Marland Kitchen EPINEPHrine 0.3 mg/0.3 mL IJ SOAJ injection Inject 0.3 mg into the muscle as needed for anaphylaxis. 1 each 1  . fluticasone (FLONASE) 50 MCG/ACT nasal spray Place 2 sprays into both nostrils daily.     . metoprolol succinate (TOPROL-XL) 50 MG 24 hr tablet Take 0.5 tablets (25 mg total)  by mouth daily. 90 tablet 3  . PARoxetine (PAXIL) 10 MG tablet 1 1/2 tablets daily. 135 tablet 4  . valACYclovir (VALTREX) 1000 MG tablet Take 2 tabs and repeat in 12 hours as needed for cold sores. 30  tablet 1  . VENTOLIN HFA 108 (90 Base) MCG/ACT inhaler INHALE 2 PUFFS INTO THE LUNGS EVERY 6 HOURS AS NEEDED FOR WHEEZING OR SHORTNESS OF BREATH. (Patient taking differently: Inhale 2 puffs into the lungs every 6 (six) hours as needed for wheezing or shortness of breath.) 18 Inhaler 0  . sulfamethoxazole-trimethoprim (BACTRIM) 200-40 MG/5ML suspension DO NOT Take at home. Bring to Dr. Julianne Rice office for Drug challenge. 100 mL 0   No current facility-administered medications for this visit.   Allergies: Allergies  Allergen Reactions  . Amoxicillin Anaphylaxis  . Ceftriaxone Anaphylaxis  . Erythromycin Anaphylaxis  . Levofloxacin Anaphylaxis  . Rocephin [Ceftriaxone Sodium In Dextrose] Anaphylaxis  . Shellfish Allergy Anaphylaxis  . Tetracyclines & Related Anaphylaxis  . Amoxicillin-Pot Clavulanate Hives  . Clindamycin/Lincomycin Hives  . Codeine Hives  . Latex   . Sulfa Antibiotics Hives  . Vancomycin Rash   I reviewed her past medical history, social history, family history, and environmental history and no significant changes have been reported from her previous visit.  Review of Systems  Constitutional: Negative for appetite change, chills, fever and unexpected weight change.  HENT: Negative for congestion and rhinorrhea.   Eyes: Negative for itching.  Respiratory: Positive for chest tightness. Negative for cough, shortness of breath and wheezing.   Cardiovascular: Negative for chest pain.  Gastrointestinal: Negative for abdominal pain.  Genitourinary: Negative for difficulty urinating.  Skin: Negative for rash.  Allergic/Immunologic: Positive for environmental allergies.  Neurological: Negative for headaches.   Objective: BP 114/80 (BP Location: Right Arm, Patient Position: Sitting, Cuff Size: Normal)   Pulse 68   Resp 16   LMP 02/10/2018   SpO2 98%  There is no height or weight on file to calculate BMI. Physical Exam Vitals and nursing note reviewed.  Constitutional:       Appearance: Normal appearance. She is well-developed.  HENT:     Head: Normocephalic and atraumatic.     Right Ear: Tympanic membrane and external ear normal.     Left Ear: Tympanic membrane and external ear normal.     Nose: Nose normal.     Mouth/Throat:     Mouth: Mucous membranes are moist.     Pharynx: Oropharynx is clear.  Eyes:     Conjunctiva/sclera: Conjunctivae normal.  Cardiovascular:     Rate and Rhythm: Normal rate and regular rhythm.     Heart sounds: Normal heart sounds. No murmur heard. No friction rub. No gallop.   Pulmonary:     Effort: Pulmonary effort is normal.     Breath sounds: Normal breath sounds. No wheezing, rhonchi or rales.  Musculoskeletal:     Cervical back: Neck supple.  Skin:    General: Skin is warm.     Findings: No rash.  Neurological:     Mental Status: She is alert and oriented to person, place, and time.  Psychiatric:        Behavior: Behavior normal.     Diagnostics: Results discussed with patient/family.  Oral Challenge - 02/18/21 0900    Challenge Food/Drug Sulfa    Food/Drug provided by Patient    BP 108/70    Pulse 72    Respirations 17    Lungs 98    Time 0912  Dose 0.1cc Bactrim Dilution    Time 0929    Dose 0.25 cc    Skin itching on forehead   Gave 25 mg of Benadryl, then 60 mg prednisone an additional 25 mg of Benadryl.          Previous notes and tests were reviewed. The plan was reviewed with the patient/family, and all questions/concerned were addressed.  It was my pleasure to see Emiline today and participate in her care. Please feel free to contact me with any questions or concerns.  Sincerely,  Rexene Alberts, DO Allergy & Immunology  [facility] office: [place] office: [phone]

## 2021-02-19 NOTE — Telephone Encounter (Signed)
Spoke with patient, she stated that she is doing good today and that the itchy did go away.

## 2021-03-04 ENCOUNTER — Other Ambulatory Visit: Payer: Self-pay

## 2021-03-04 ENCOUNTER — Telehealth: Payer: BC Managed Care – PPO | Admitting: Nurse Practitioner

## 2021-03-04 ENCOUNTER — Ambulatory Visit: Payer: BC Managed Care – PPO

## 2021-03-04 DIAGNOSIS — R5081 Fever presenting with conditions classified elsewhere: Secondary | ICD-10-CM

## 2021-03-04 DIAGNOSIS — Z20822 Contact with and (suspected) exposure to covid-19: Secondary | ICD-10-CM

## 2021-03-04 LAB — POCT INFLUENZA A/B
Influenza A, POC: NEGATIVE
Influenza B, POC: NEGATIVE

## 2021-03-04 LAB — POC COVID19 BINAXNOW: SARS Coronavirus 2 Ag: NEGATIVE

## 2021-03-04 NOTE — Progress Notes (Signed)
   Subjective:    Patient ID: Hailey Wilkins, female    DOB: [birth year], 50 y.o.   MRN: [id number]  HPI  Has had positive COVID contact over the past week (son and daughter in law) she has been keeping their baby. The baby had a fever 02/24/21 and then was fever free over the weekend when the patient kept the baby with her.  Patient symptom onset 4/11 Symptom onset: Fever 101 Body aches  HA Sore Throat and "heavy chest" Ear pain  She has been vaccinated against COVID-19 and has had a booster.   Pulse 93, 98%  [id number]  Using her inhaler twice daily  Dry cough   Patient consents to telehealth appointment  Review of Systems     Objective:   Physical Exam  This was a telehealth appointment with patient after nurse visit for flu and COVID-19 testing.   Patient was in no acute distress during phone conversation with provider.   Recent Results (from the past 2160 hour(s))  Cytology - PAP( [place])     Status: None   Collection Time: 01/14/21  4:28 PM  Result Value Ref Range   High risk HPV Negative    Adequacy      Satisfactory for evaluation; transformation zone component PRESENT.   Diagnosis      - Negative for Intraepithelial Lesions or Malignancy (NILM)   Diagnosis - Benign reactive/reparative changes    Comment Normal Reference Range HPV - Negative   POC COVID-19     Status: Normal   Collection Time: 03/04/21  1:35 PM  Result Value Ref Range   SARS Coronavirus 2 Ag Negative Negative    Comment: Vaccinated and boosted Therapist, music). Symptomatic and +CC. Patient aware of negative POC results. Has phone appt with Marigene Ehlers FNP for further eval.  POCT Influenza A/B     Status: Normal   Collection Time: 03/04/21  1:49 PM  Result Value Ref Range   Influenza A, POC Negative Negative   Influenza B, POC Negative Negative       Assessment & Plan:    Dicussed with patient will send off PCR and follow up when results return, may consider referral to [facility]  treatment at [facility] if positive due to underlying asthma and HTN   For now continue management of symptoms with OTC Mucinex and allergy medications.  Push fluids, rest- support immune system.   Continue to use albuterol as needed

## 2021-03-05 ENCOUNTER — Encounter: Payer: Self-pay | Admitting: Nurse Practitioner

## 2021-03-05 DIAGNOSIS — J019 Acute sinusitis, unspecified: Secondary | ICD-10-CM

## 2021-03-05 LAB — NOVEL CORONAVIRUS, NAA: SARS-CoV-2, NAA: NOT DETECTED

## 2021-03-05 LAB — SARS-COV-2, NAA 2 DAY TAT

## 2021-03-05 MED ORDER — PREDNISONE 10 MG PO TABS: 10.0000 mg | ORAL_TABLET | Freq: Every day | ORAL | 0 refills | Status: AC

## 2021-03-05 NOTE — Telephone Encounter (Signed)
Spoke with patient - PCR results are still pending. She has an extended list of antibiotic allergies and her sinuses and ears are now giving her pain with the pressure that is not relieved with OTC management.   She is also having increased fatigue  Will order prednisone taper, patient has tolerated well in the past.  Rest over the weekend Discussed extra care while on steroids   Will follow up with PCR result when available- since [facility] is closed on Monday for Holiday advised patient reach out to PCP Monday if symptoms persist, or if PCR is positive.   Seek immediate medical attention over the weekend for any acutely worsening symptoms  Meds ordered this encounter  Medications  . predniSONE (DELTASONE) 10 MG tablet    Sig: Take 1 tablet (10 mg total) by mouth daily with breakfast for 6 days. Take 6 tablets on day one, 5 on day two, 4 on day three, 3 on day four, 2 on day five, 1 on day six, take each dose with food    Dispense:  21 tablet    Refill:  0

## 2021-03-12 ENCOUNTER — Encounter: Payer: Self-pay | Admitting: Medical

## 2021-03-31 ENCOUNTER — Telehealth: Payer: Self-pay | Admitting: Cardiovascular Disease

## 2021-03-31 MED ORDER — METOPROLOL SUCCINATE ER 50 MG PO TB24: 25.0000 mg | ORAL_TABLET | Freq: Every day | ORAL | 3 refills | Status: DC

## 2021-03-31 NOTE — Telephone Encounter (Signed)
*  STAT* If patient is at the pharmacy, call can be transferred to refill team.   1. Which medications need to be refilled? (please list name of each medication and dose if known)    Metoprolol 25 mg po q d   2. Which pharmacy/location (including street and city if local pharmacy) is medication to be sent to? [employer] [address] .   3. Do they need a 30 day or 90 day supply? [place]

## 2021-04-02 ENCOUNTER — Ambulatory Visit: Payer: BC Managed Care – PPO | Admitting: Cardiovascular Disease

## 2021-04-19 ENCOUNTER — Encounter: Payer: Self-pay | Admitting: Allergy

## 2021-04-20 MED ORDER — CROMOLYN SODIUM 4 % OP SOLN: 1.0000 [drp] | Freq: Four times a day (QID) | OPHTHALMIC | 3 refills | Status: DC | PRN

## 2021-05-16 ENCOUNTER — Encounter: Payer: Self-pay | Admitting: Allergy

## 2021-05-18 ENCOUNTER — Other Ambulatory Visit: Payer: Self-pay

## 2021-05-18 ENCOUNTER — Ambulatory Visit: Payer: BC Managed Care – PPO | Admitting: Family Medicine

## 2021-05-18 ENCOUNTER — Encounter: Payer: Self-pay | Admitting: Family Medicine

## 2021-05-18 VITALS — BP 130/72 | HR 69 | Temp 98.7°F | Resp 16

## 2021-05-18 DIAGNOSIS — H1013 Acute atopic conjunctivitis, bilateral: Secondary | ICD-10-CM

## 2021-05-18 DIAGNOSIS — J302 Other seasonal allergic rhinitis: Secondary | ICD-10-CM | POA: Diagnosis not present

## 2021-05-18 DIAGNOSIS — T781XXD Other adverse food reactions, not elsewhere classified, subsequent encounter: Secondary | ICD-10-CM

## 2021-05-18 DIAGNOSIS — Z889 Allergy status to unspecified drugs, medicaments and biological substances status: Secondary | ICD-10-CM

## 2021-05-18 DIAGNOSIS — L509 Urticaria, unspecified: Secondary | ICD-10-CM | POA: Diagnosis not present

## 2021-05-18 DIAGNOSIS — R21 Rash and other nonspecific skin eruption: Secondary | ICD-10-CM | POA: Diagnosis not present

## 2021-05-18 NOTE — Progress Notes (Addendum)
[address] [place] [address] Dept: [phone]  FOLLOW UP NOTE  Patient ID: Hailey Wilkins, female    DOB: [birth year]  Age: 50 y.o. MRN: [id number] Date of Office Visit: 05/18/2021  Assessment  Chief Complaint: Rash (Had salmon with a breading, had stomach pain and then rash started. Not sure if it is related or not. She has just started a natures own hair skin and nails.Last time this happened it was due to a doterra tablet. It always starts in the vagina and move to other parts of the body. [place]. Pasted out coming out of [place] and was dehydrated)  HPI Hailey Wilkins is a 50 year old female who presents to the clinic for an evaluation of an acute rash. She was last seen in this clinic on 02/18/2021 by Dr. Maudie Mercury for evaluation of drug allergy, food allergy to shellfish, and allergic conjunctivitis.  At today's visit, she reports that while on vacation in [place] she developed a rash that began in the area of her genitals and buttocks and began to spread eventually covering her body.  She reports that the rash did not come and go but began to find more in areas.  She reports that the rash has since started to resolve in the areas where it first began.  She denies concomitant cardiopulmonary or gastrointestinal symptoms with this rash.  She reports the timeline of events follows.  About 12 days before the rash began she started taking natures bounty vitamins.  On Friday night she ate salmon and within an hour developed stomachache and diarrhea.  She did not have vomiting.  On Sunday morning she began to develop the rash in her genital and buttock area and the rash quickly spread.  At this point she was in a hotel in [place].  She reports the rash remained elevated, red, and extremely pruritic.  On Wednesday she reports some slight chest tightening.  She was taking Benadryl and using cortisone and Desitin for relief of itch at  that time.  She reports that by the end of the week she collapsed with low blood pressure and required rehydration therapy.  She denies new personal care products, although, she was staying in a hotel with no control over her laundry detergent for the bedding at that time.  She denies new medications, insect stings, or any new foods.  She denies recent tick bites.  She denies exposure to shellfish, however, there is a slight possibility of cross-contamination with the salmon she ate on Friday night as her husband and son had dinner items with lobster.  She has since started taking cetirizine and famotidine daily with no relief of symptoms.  Allergic rhinitis is reported as well controlled with no symptoms at this time.  Allergic conjunctivitis is reported as well controlled with cromolyn eyedrops.  Her current medications are listed in the chart.   Drug Allergies:  Allergies  Allergen Reactions   Amoxicillin Anaphylaxis   Ceftriaxone Anaphylaxis   Erythromycin Anaphylaxis   Levofloxacin Anaphylaxis   Rocephin [Ceftriaxone Sodium In Dextrose] Anaphylaxis   Shellfish Allergy Anaphylaxis   Tetracyclines & Related Anaphylaxis   Amoxicillin-Pot Clavulanate Hives   Clindamycin/Lincomycin Hives   Codeine Hives   Latex    Sulfa Antibiotics Hives   Vancomycin Rash    Physical Exam: BP 130/72   Pulse 69   Temp 98.7 F (37.1 C) (Temporal)   Resp 16  LMP 02/10/2018   SpO2 98%    Physical Exam Vitals reviewed.  Constitutional:      Appearance: Normal appearance.  HENT:     Head: Normocephalic and atraumatic.     Right Ear: Tympanic membrane normal.     Left Ear: Tympanic membrane normal.     Nose:     Comments: Bilateral ears normal.  Pharynx normal.  Ears normal.  Eyes normal.    Mouth/Throat:     Pharynx: Oropharynx is clear.  Eyes:     Conjunctiva/sclera: Conjunctivae normal.  Cardiovascular:     Rate and Rhythm: Normal rate and regular rhythm.     Heart sounds: Normal heart  sounds. No murmur heard. Pulmonary:     Effort: Pulmonary effort is normal.     Breath sounds: Normal breath sounds.     Comments: Lungs clear to auscultation Musculoskeletal:        General: Normal range of motion.     Cervical back: Normal range of motion and neck supple.  Skin:    General: Skin is warm.     Comments: Scattered small flesh-colored raised areas noted on her arms and legs.  No open areas or drainage noted.  Neurological:     Mental Status: She is alert and oriented to person, place, and time.  Psychiatric:        Mood and Affect: Mood normal.        Behavior: Behavior normal.        Thought Content: Thought content normal.        Judgment: Judgment normal.    Assessment and Plan: 1. Urticaria   2. Rash and other nonspecific skin eruption   3. Allergic conjunctivitis of both eyes   4. Other seasonal allergic rhinitis   5. Multiple drug allergies   6. Adverse reaction to food, subsequent encounter      Patient Instructions  Hives (urticaria) Use the least amount of medications while remaining hive free Cetirizine (Zyrtec) 10mg  twice a day and famotidine (Pepcid) 20 mg twice a day. If no symptoms for 7-14 days then decrease to. Cetirizine (Zyrtec) 10mg  twice a day and famotidine (Pepcid) 20 mg once a day.  If no symptoms for 7-14 days then decrease to. Cetirizine (Zyrtec) 10mg  twice a day.  If no symptoms for 7-14 days then decrease to. Cetirizine (Zyrtec) 10mg  once a day.  May use Benadryl (diphenhydramine) as needed for breakthrough hives       If symptoms return, then step up dosage Keep a detailed symptom journal including foods eaten, contact with allergens, medications taken, weather changes.   Rash Prednisone 10 mg tablets. Take 2 tablets once a day for 4 days, then take 1 tablet on the 5th day, then stop.   Return to the clinic for patch testing if you are interested. Patches are placed on Monday and removed on Wednesday. Testing is read on Wednesday  and Friday  Allergic rhinitis Continue allergen avoidance measures directed toward as listed below Continue cetirizine as listed above Continue Flonase 2 sprays in each nostril once a day as needed for stuffy nose Consider saline nasal rinses as needed for nasal symptoms. Use this before any medicated nasal sprays for best result  Allergic conjunctivitis Continue Opticrom 4% 1 drop in each eye 4 times a day as needed for red or itchy eyes  Food allergy Continue to avoid Nature's Bounty vitamins, fish and shellfish.  In case of an allergic reaction, take Benadryl 50 mg every 4 hours,  and if life-threatening symptoms occur, inject with EpiPen 0.3 mg. We can skin test for salmon at your next visit.   Drug allergy Continue to avoid the medications listed in your allergy list  Call the clinic if this treatment plan is not working well for you  Follow up in 3-4 weeks or sooner if needed.  Return in about 4 weeks (around 06/15/2021), or if symptoms worsen or fail to improve.    Thank you for the opportunity to care for this patient.  Please do not hesitate to contact me with questions.  Gareth Morgan, FNP [facility]

## 2021-05-18 NOTE — Patient Instructions (Addendum)
Hives (urticaria) Use the least amount of medications while remaining hive free Cetirizine (Zyrtec) 10mg  twice a day and famotidine (Pepcid) 20 mg twice a day. If no symptoms for 7-14 days then decrease to. Cetirizine (Zyrtec) 10mg  twice a day and famotidine (Pepcid) 20 mg once a day.  If no symptoms for 7-14 days then decrease to. Cetirizine (Zyrtec) 10mg  twice a day.  If no symptoms for 7-14 days then decrease to. Cetirizine (Zyrtec) 10mg  once a day.  May use Benadryl (diphenhydramine) as needed for breakthrough hives       If symptoms return, then step up dosage Keep a detailed symptom journal including foods eaten, contact with allergens, medications taken, weather changes.   Rash Prednisone 10 mg tablets. Take 2 tablets once a day for 4 days, then take 1 tablet on the 5th day, then stop.   Return to the clinic for patch testing if you are interested. Patches are placed on Monday and removed on Wednesday. Testing is read on Wednesday and Friday  Allergic rhinitis Continue allergen avoidance measures directed toward as listed below Continue cetirizine as listed above Continue Flonase 2 sprays in each nostril once a day as needed for stuffy nose Consider saline nasal rinses as needed for nasal symptoms. Use this before any medicated nasal sprays for best result  Allergic conjunctivitis Continue Opticrom 4% 1 drop in each eye 4 times a day as needed for red or itchy eyes  Food allergy Continue to avoid Nature's Bounty vitamins, fish and shellfish.  In case of an allergic reaction, take Benadryl 50 mg every 4 hours, and if life-threatening symptoms occur, inject with EpiPen 0.3 mg. We can skin test for salmon at your next visit.   Drug allergy Continue to avoid the medications listed in your allergy list  Call the clinic if this treatment plan is not working well for you  Follow up in 3-4 weeks or sooner if needed.

## 2021-05-19 ENCOUNTER — Encounter: Payer: Self-pay | Admitting: Family Medicine

## 2021-06-01 ENCOUNTER — Other Ambulatory Visit
Admission: RE | Admit: 2021-06-01 | Discharge: 2021-06-01 | Disposition: A | Discharge status: Home / Self Care | Payer: BC Managed Care – PPO | Attending: Cardiovascular Disease | Admitting: Cardiovascular Disease

## 2021-06-01 ENCOUNTER — Ambulatory Visit: Payer: BC Managed Care – PPO | Admitting: Cardiovascular Disease

## 2021-06-01 ENCOUNTER — Other Ambulatory Visit: Payer: Self-pay

## 2021-06-01 ENCOUNTER — Encounter: Payer: Self-pay | Admitting: Cardiovascular Disease

## 2021-06-01 VITALS — BP 118/80 | HR 69 | Ht 62.5 in | Wt 162.4 lb

## 2021-06-01 DIAGNOSIS — R55 Syncope and collapse: Secondary | ICD-10-CM

## 2021-06-01 DIAGNOSIS — R000 Tachycardia, unspecified: Secondary | ICD-10-CM | POA: Diagnosis present

## 2021-06-01 LAB — COMPREHENSIVE METABOLIC PANEL
ALT: 23 U/L (ref 0–44)
AST: 22 U/L (ref 15–41)
Albumin: 4.3 g/dL (ref 3.5–5.0)
Alkaline Phosphatase: 69 U/L (ref 38–126)
Anion gap: 5 (ref 5–15)
BUN: 13 mg/dL (ref 6–20)
CO2: 29 mmol/L (ref 22–32)
Calcium: 9.3 mg/dL (ref 8.9–10.3)
Chloride: 105 mmol/L (ref 98–111)
Creatinine, Ser: 0.83 mg/dL (ref 0.44–1.00)
GFR, Estimated: >60 mL/min (ref >60)
Glucose, Bld: 98 mg/dL (ref 70–99)
Potassium: 3.9 mmol/L (ref 3.5–5.1)
Sodium: 139 mmol/L (ref 135–145)
Total Bilirubin: 0.5 mg/dL (ref 0.3–1.2)
Total Protein: 7.6 g/dL (ref 6.5–8.1)

## 2021-06-01 LAB — CBC WITH DIFFERENTIAL/PLATELET
Abs Immature Granulocytes: 0.01 10*3/uL (ref 0.00–0.07)
Basophils Absolute: 0 10*3/uL (ref 0.0–0.1)
Basophils Relative: 0 %
Eosinophils Absolute: 0.2 10*3/uL (ref 0.0–0.5)
Eosinophils Relative: 3 %
HCT: 38.7 % (ref 36.0–46.0)
Hemoglobin: 13 g/dL (ref 12.0–15.0)
Immature Granulocytes: 0 %
Lymphocytes Relative: 33 %
Lymphs Abs: 2.4 10*3/uL (ref 0.7–4.0)
MCH: 29.6 pg (ref 26.0–34.0)
MCHC: 33.6 g/dL (ref 30.0–36.0)
MCV: 88.2 fL (ref 80.0–100.0)
Monocytes Absolute: 0.6 10*3/uL (ref 0.1–1.0)
Monocytes Relative: 8 %
Neutro Abs: 4.1 10*3/uL (ref 1.7–7.7)
Neutrophils Relative %: 56 %
Platelets: 290 10*3/uL (ref 150–400)
RBC: 4.39 MIL/uL (ref 3.87–5.11)
RDW: 13.7 % (ref 11.5–15.5)
WBC: 7.3 10*3/uL (ref 4.0–10.5)
nRBC: 0 % (ref 0.0–0.2)

## 2021-06-01 LAB — TSH: TSH: 3.98 u[IU]/mL (ref 0.350–4.500)

## 2021-06-01 NOTE — Patient Instructions (Signed)
Medication Instructions:  Your physician recommends that you continue on your current medications as directed. Please refer to the Current Medication list given to you today.  *If you need a refill on your cardiac medications before your next appointment, please call your pharmacy*   Lab Work: Cmp, Cbc, Tsh today.  Please have your labs drawn at the medical mall. Stop at the registration desk to check in.  If you have labs (blood work) drawn today and your tests are completely normal, you will receive your results only by: [place] (if you have MyChart) OR A paper copy in the mail If you have any lab test that is abnormal or we need to change your treatment, we will call you to review the results.   Testing/Procedures: None ordered   Follow-Up: At [facility], you and your health needs are our priority.  As part of our continuing mission to provide you with exceptional heart care, we have created designated Provider Care Teams.  These Care Teams include your primary Cardiologist (physician) and Advanced Practice Providers (APPs -  Physician Assistants and Nurse Practitioners) who all work together to provide you with the care you need, when you need it.  We recommend signing up for the patient portal called "MyChart".  Sign up information is provided on this After Visit Summary.  MyChart is used to connect with patients for Virtual Visits (Telemedicine).  Patients are able to view lab/test results, encounter notes, upcoming appointments, etc.  Non-urgent messages can be sent to your provider as well.   To learn more about what you can do with MyChart, go to [email or web address].    Your next appointment:   Your physician wants you to follow-up in: 1 year You will receive a reminder letter in the mail two months in advance. If you don't receive a letter, please call our office to schedule the follow-up appointment.   The format for your next appointment:   In  Person  Provider:   You may see Kathlyn Sacramento, MD or one of the following Advanced Practice Providers on your designated Care Team:   Murray Hodgkins, NP Christell Faith, PA-C Marrianne Mood, PA-C Cadence Kathlen Mody, [place]   Other Instructions N/A

## 2021-06-01 NOTE — Progress Notes (Signed)
Cardiology Office Note   Date:  06/01/2021   ID:  Mishka, Stegemann 05-01-1971, MRN [id number]  PCP:  Ebbie Ridge, MD  Cardiologist:   Kathlyn Sacramento, MD   Chief Complaint  Patient presents with   Other    12 month f/u pt mentioned having episode about x3 wks ago while at [facility] but is contributing it to benadryl and dehydration c/o rapid heart beat. Meds reviewed verbally with pt.      History of Present Illness: Hailey Wilkins is a 50 y.o. female who presents for a followup visit regarding inappropriate sinus tachycardia. She did have pleuritic chest pain and dyspnea with negative cardiac evaluation in 2013. She underwent a treadmill stress test which showed no evidence of ischemia. Echocardiogram was normal.  She has other chronic medical conditions including migraines, anemia and asthma.  She suffers from significant allergies.  Her tachycardia has been well controlled with Toprol.  She was recently at [facility] and it was very hot there.  She had a severe allergic reaction either due to shellfish or some new supplements that she was taking.  She had a generalized rash and was taking large amount of Benadryl to control her symptoms.  While she was there, she had a syncopal episode and called EMS.  She was noted to be tachycardic with orthostatic hypotension.  She was dehydrated at that time.  When she returned back home she felt back to normal.  No recurrent episodes.  No chest pain, shortness of breath or palpitations.     Past Medical History:  Diagnosis Date   Allergic rhinitis    Dr. Doreene Nest, s/p allergy testing   Anemia    Asthma    Basal cell carcinoma of anterior chest    removed   Breast cyst 2011   bx benign   Complication of anesthesia    Depression    after father died, on [place] in past   Family history of breast cancer    Family history of pancreatic cancer    Family history of stomach cancer    GERD (gastroesophageal reflux  disease)    Gestational diabetes    with first child   Headache(784.0)    History of chicken pox    Hypertension    Inappropriate sinus node tachycardia    Internal hemorrhoid 2006   dx during colon w/Dr Tiffany Kocher   Migraines    associated with menses in past, tx with excedrin, may last up to 1 week   Migraines    Pericarditis    a. 10/2012 ETT: Ex time 9:00, No ST/T changes; b. 10/2012 Echo: EF 55-60%, no rwma, no pericardial effusion.   PONV (postoperative nausea and vomiting)    surgery in 2017 at [facility], she had no n/v   Shingles 04/2011   Urticaria 11/10/2020   UTI (lower urinary tract infection)     Past Surgical History:  Procedure Laterality Date   ANTERIOR CERVICAL DECOMP/DISCECTOMY FUSION N/A 06/12/2019   Procedure: ANTERIOR CERVICAL DECOMPRESSION/DISCECTOMY FUSION CERVICAL FOUR- CERVICAL FIVE, CERVICAL FIVE- CERVICAL SIX, CERVICAL SIX- CERVICAL SEVEN;  Surgeon: Jovita Gamma, MD;  Location: [facility];  Service: Neurosurgery;  Laterality: N/A;  ANTERIOR CERVICAL DECOMPRESSION/DISCECTOMY FUSION CERVICAL 4- CERVICAL 5, CERVICAL 5- CERVICAL 6, CERVICAL 6- CERVICAL7   BASAL CELL CARCINOMA EXCISION  2011   removed from chest/[facility]   BREAST BIOPSY Right 07/25/2018   stereo bx coil clip- CYSTIC PAPILLARY APOCRINE METAPLASIA   CHOLECYSTECTOMY  2010   [facility]   COLONOSCOPY     KNEE ARTHROSCOPY W/ MENISCAL REPAIR  1989   Right, Dr. Mauri Pole   NASAL SINUS SURGERY  1999, 2005   REDUCTION MAMMAPLASTY  2017   TONSILLECTOMY  1988     Current Outpatient Medications  Medication Sig Dispense Refill   cetirizine (ZYRTEC) 10 MG tablet Take 10 mg by mouth daily.     cromolyn (OPTICROM) 4 % ophthalmic solution Place 1 drop into both eyes 4 (four) times daily as needed (itchy/watery eyes). 10 mL 3   EPINEPHrine 0.3 mg/0.3 mL IJ SOAJ injection Inject 0.3 mg into the muscle as needed for anaphylaxis. 1 each 1   famotidine (PEPCID) 20 MG tablet Take 20  mg by mouth 2 (two) times daily.     fluticasone (FLONASE) 50 MCG/ACT nasal spray Place 2 sprays into both nostrils daily.      metoprolol succinate (TOPROL-XL) 50 MG 24 hr tablet Take 0.5 tablets (25 mg total) by mouth daily. 90 tablet 3   PARoxetine (PAXIL) 10 MG tablet 1 1/2 tablets daily. 135 tablet 4   valACYclovir (VALTREX) 1000 MG tablet Take 2 tabs and repeat in 12 hours as needed for cold sores. 30 tablet 1   VENTOLIN HFA 108 (90 Base) MCG/ACT inhaler INHALE 2 PUFFS INTO THE LUNGS EVERY 6 HOURS AS NEEDED FOR WHEEZING OR SHORTNESS OF BREATH. (Patient taking differently: Inhale 2 puffs into the lungs every 6 (six) hours as needed for wheezing or shortness of breath.) 18 Inhaler 0   No current facility-administered medications for this visit.    Allergies:   Amoxicillin, Ceftriaxone, Erythromycin, Levofloxacin, Rocephin [ceftriaxone sodium in dextrose], Shellfish allergy, Tetracyclines & related, Amoxicillin-pot clavulanate, Clindamycin/lincomycin, Codeine, Latex, Sulfa antibiotics, and Vancomycin    Social History:  The patient  reports that she has never smoked. She has never used smokeless tobacco. She reports current alcohol use of about 5.0 standard drinks of alcohol per week. She reports that she does not use drugs.   Family History:  The patient's family history includes Alcohol abuse in her maternal grandfather and paternal grandfather; Arthritis in her mother; Brain cancer (age of onset: 55) in her paternal grandmother; Breast cancer (age of onset: 44) in her cousin; Breast cancer (age of onset: 69) in her cousin; Breast cancer (age of onset: 29) in her mother; Cancer in her maternal grandmother; Cancer (age of onset: 73) in her sister; Cancer (age of onset: 72) in her mother; Cancer (age of onset: 60) in her father; Diabetes in her father; Heart disease in her father and paternal grandfather; Hyperlipidemia in her father and paternal grandfather; Hypertension in her father, mother, and  paternal grandfather; Other in her paternal grandmother; Pancreatic cancer (age of onset: 48) in her maternal grandfather; Stomach cancer (age of onset: 34) in her maternal aunt and maternal aunt.    ROS:  Please see the history of present illness.   Otherwise, review of systems are positive for none.   All other systems are reviewed and negative.    PHYSICAL EXAM: VS:  BP 118/80 (BP Location: Left Arm, Patient Position: Sitting, Cuff Size: Normal)   Pulse 69   Ht 5' 2.5" (1.588 m)   Wt 162 lb 6 oz (73.7 kg)   LMP 02/10/2018   SpO2 98%   BMI 29.23 kg/m  , BMI Body mass index is 29.23 kg/m. GEN: Well nourished, well developed, in no acute distress  HEENT: normal  Neck: no JVD, carotid bruits,  or masses Cardiac: RRR; no murmurs, rubs, or gallops,no edema  Respiratory:  clear to auscultation bilaterally, normal work of breathing GI: soft, nontender, nondistended, + BS MS: no deformity or atrophy  Skin: warm and dry, no rash Neuro:  Strength and sensation are intact Psych: euthymic mood, full affect   EKG:  EKG is ordered today. The ekg ordered today demonstrates normal sinus rhythm with no significant or T wave changes.   Recent Labs: 11/10/2020: ALT 25; BUN 20; Creatinine, Ser 0.79; Hemoglobin 13.7; Platelets 390; Potassium 4.8; Sodium 142; TSH 2.000    Lipid Panel    Component Value Date/Time   CHOL 145 05/04/2017 1039   TRIG 64 05/04/2017 1039   HDL 48 05/04/2017 1039   CHOLHDL 3.0 05/04/2017 1039   CHOLHDL 2.7 12/09/2014 1509   VLDL 18 12/09/2014 1509   LDLCALC 84 05/04/2017 1039      Wt Readings from Last 3 Encounters:  06/01/21 162 lb 6 oz (73.7 kg)  01/14/21 158 lb 12.8 oz (72 kg)  11/10/20 169 lb 6.4 oz (76.8 kg)       No flowsheet data found.    ASSESSMENT AND PLAN:  1.  Inappropriate sinus tachycardia: Well controlled on current dose of Toprol and she is overall doing very well.  I referred Toprol today.  2.  Recent syncope: Suspect most likely  due to orthostatic hypotension in the setting of severe allergic reaction and probably a component of volume depletion given extreme heat.  The patient is back to normal.  Her cardiac exam is unremarkable and EKG does not show any significant abnormalities.  I do not think an echocardiogram or a monitor are indicated at this time unless she has recurrent symptoms.  I am going to obtain routine labs on her which were requested today.    Disposition:   FU with me in 1 year  Signed,  Kathlyn Sacramento, MD  06/01/2021 4:28 PM    [place]

## 2021-06-09 ENCOUNTER — Other Ambulatory Visit: Payer: Self-pay

## 2021-06-09 ENCOUNTER — Ambulatory Visit (INDEPENDENT_AMBULATORY_CARE_PROVIDER_SITE_OTHER): Payer: BC Managed Care – PPO | Admitting: Family Medicine

## 2021-06-09 ENCOUNTER — Encounter: Payer: Self-pay | Admitting: Family Medicine

## 2021-06-09 DIAGNOSIS — H1013 Acute atopic conjunctivitis, bilateral: Secondary | ICD-10-CM | POA: Diagnosis not present

## 2021-06-09 DIAGNOSIS — R21 Rash and other nonspecific skin eruption: Secondary | ICD-10-CM

## 2021-06-09 DIAGNOSIS — J302 Other seasonal allergic rhinitis: Secondary | ICD-10-CM | POA: Diagnosis not present

## 2021-06-09 DIAGNOSIS — J4520 Mild intermittent asthma, uncomplicated: Secondary | ICD-10-CM

## 2021-06-09 DIAGNOSIS — Z889 Allergy status to unspecified drugs, medicaments and biological substances status: Secondary | ICD-10-CM

## 2021-06-09 DIAGNOSIS — L509 Urticaria, unspecified: Secondary | ICD-10-CM

## 2021-06-09 MED ORDER — ALBUTEROL SULFATE HFA 108 (90 BASE) MCG/ACT IN AERS: 2.0000 | INHALATION_SPRAY | Freq: Four times a day (QID) | RESPIRATORY_TRACT | 2 refills | Status: AC | PRN

## 2021-06-09 NOTE — Patient Instructions (Addendum)
Hives (urticaria) Use the least amount of medications while remaining hive free Cetirizine (Zyrtec) 10mg  twice a day and famotidine (Pepcid) 20 mg twice a day. If no symptoms for 7-14 days then decrease to. Cetirizine (Zyrtec) 10mg  twice a day and famotidine (Pepcid) 20 mg once a day.  If no symptoms for 7-14 days then decrease to. Cetirizine (Zyrtec) 10mg  twice a day.  If no symptoms for 7-14 days then decrease to. Cetirizine (Zyrtec) 10mg  once a day.  May use Benadryl (diphenhydramine) as needed for breakthrough hives       If symptoms return, then step up dosage Keep a detailed symptom journal including foods eaten, contact with allergens, medications taken, weather changes.   Rash Return to the clinic for patch testing if you are interested. Patches are placed on Monday and removed on Wednesday. Testing is read on Wednesday and Friday  Asthma Continue albuterol 2 puffs once every 4 hours as needed for cough or wheeze You may use albuterol 2 puffs 5-15 minutes before activity to decrease cough or wheeze  Allergic rhinitis Continue allergen avoidance measures directed toward grass pollen, weed pollen, and tree pollen as listed below Continue cetirizine as listed above Continue Flonase 2 sprays in each nostril once a day as needed for stuffy nose Consider saline nasal rinses as needed for nasal symptoms. Use this before any medicated nasal sprays for best result  Allergic conjunctivitis Continue Opticrom 4% 1 drop in each eye 4 times a day as needed for red or itchy eyes  Food allergy Continue to avoid doTerra products, Nature's Bounty vitamins, fish and shellfish.  In case of an allergic reaction, take Benadryl 50 mg every 4 hours, and if life-threatening symptoms occur, inject with EpiPen 0.3 mg. We can skin test for salmon at your next visit.   Drug allergy Continue to avoid the medications listed in your allergy list  Call the clinic if this treatment plan is not working well  for you  Follow up in 6 months or sooner if needed.  Reducing Pollen Exposure The American Academy of Allergy, Asthma and Immunology suggests the following steps to reduce your exposure to pollen during allergy seasons. Do not hang sheets or clothing out to dry; pollen may collect on these items. Do not mow lawns or spend time around freshly cut grass; mowing stirs up pollen. Keep windows closed at night.  Keep car windows closed while driving. Minimize morning activities outdoors, a time when pollen counts are usually at their highest. Stay indoors as much as possible when pollen counts or humidity is high and on windy days when pollen tends to remain in the air longer. Use air conditioning when possible.  Many air conditioners have filters that trap the pollen spores. Use a HEPA room air filter to remove pollen form the indoor air you breathe.

## 2021-06-09 NOTE — Progress Notes (Signed)
RE: Erianna Jolly MRN: [id number] DOB: 07/22/1971 Date of Telemedicine Visit: 06/09/2021  Referring provider: Ebbie Ridge, MD Primary care provider: Ebbie Ridge, MD  Chief Complaint: Follow-up (Pt states improvement in hives and other related symptoms since last visit)   Telemedicine Follow Up Visit via Telephone: I connected with Hailey Wilkins for a follow up on 06/09/21 by telephone and verified that I am speaking with the correct person using two identifiers.   I discussed the limitations, risks, security and privacy concerns of performing an evaluation and management service by telephone and the availability of in person appointments. I also discussed with the patient that there may be a patient responsible charge related to this service. The patient expressed understanding and agreed to proceed.  Patient is at home  Provider is at the home office.  Visit start time: 203 Visit end time: 1024 Insurance consent/check in by: Sherri Medical consent and medical assistant/nurse: Vira Agar  History of Present Illness: She is a 50 y.o. female, who is being followed for urticaria, rash, allergic rhinitis, allergic conjunctivitis, food allergy, and multiple drug allergies. Her previous allergy office visit was on 05/18/2021 with Gareth Morgan, [place].  At today's visit, she reports resolution of the raised, red, pruritic rash within a few days of beginning the prednisone taper at her last visit. She reports that she has not had any further symptoms of rash since her last visit to this clinic. Asthma is reported as well controlled with occasional albuterol with relief of symptoms. Allergic rhinitis is reported as well controlled with no rhinorrhea or nasal congestion. She continues cetirizine 10 mg once a day and Flonase as needed. Allergic conjunctivitis is reported as well controlled with cromolyn as needed. She continues to avoid fish and shellfish with no accidental ingestion. She  has continues to avoid doTerra products as well as Nature's Bounty vitamins. She is interested in moving forward with patch testing for further evaluation of her rash. Her current medications are listed in the chart.   Assessment and Plan: Saren is a 50 y.o. female with: Patient Instructions  Hives (urticaria) Use the least amount of medications while remaining hive free Cetirizine (Zyrtec) 10mg  twice a day and famotidine (Pepcid) 20 mg twice a day. If no symptoms for 7-14 days then decrease to. Cetirizine (Zyrtec) 10mg  twice a day and famotidine (Pepcid) 20 mg once a day.  If no symptoms for 7-14 days then decrease to. Cetirizine (Zyrtec) 10mg  twice a day.  If no symptoms for 7-14 days then decrease to. Cetirizine (Zyrtec) 10mg  once a day.  May use Benadryl (diphenhydramine) as needed for breakthrough hives       If symptoms return, then step up dosage Keep a detailed symptom journal including foods eaten, contact with allergens, medications taken, weather changes.   Rash Return to the clinic for patch testing if you are interested. Patches are placed on Monday and removed on Wednesday. Testing is read on Wednesday and Friday  Asthma Continue albuterol 2 puffs once every 4 hours as needed for cough or wheeze You may use albuterol 2 puffs 5-15 minutes before activity to decrease cough or wheeze  Allergic rhinitis Continue allergen avoidance measures directed toward grass pollen, weed pollen, and tree pollen as listed below Continue cetirizine as listed above Continue Flonase 2 sprays in each nostril once a day as needed for stuffy nose Consider saline nasal rinses as needed for nasal symptoms. Use this before any medicated nasal sprays for best result  Allergic  conjunctivitis Continue Opticrom 4% 1 drop in each eye 4 times a day as needed for red or itchy eyes  Food allergy Continue to avoid doTerra products, Nature's Bounty vitamins, fish and shellfish.  In case of an allergic  reaction, take Benadryl 50 mg every 4 hours, and if life-threatening symptoms occur, inject with EpiPen 0.3 mg. We can skin test for salmon at your next visit.   Drug allergy Continue to avoid the medications listed in your allergy list  Call the clinic if this treatment plan is not working well for you  Follow up in 6 months or sooner if needed.  Reducing Pollen Exposure The American Academy of Allergy, Asthma and Immunology suggests the following steps to reduce your exposure to pollen during allergy seasons. Do not hang sheets or clothing out to dry; pollen may collect on these items. Do not mow lawns or spend time around freshly cut grass; mowing stirs up pollen. Keep windows closed at night.  Keep car windows closed while driving. Minimize morning activities outdoors, a time when pollen counts are usually at their highest. Stay indoors as much as possible when pollen counts or humidity is high and on windy days when pollen tends to remain in the air longer. Use air conditioning when possible.  Many air conditioners have filters that trap the pollen spores. Use a HEPA room air filter to remove pollen form the indoor air you breathe.    Return in about 6 months (around 12/10/2021), or if symptoms worsen or fail to improve.  Meds ordered this encounter  Medications   albuterol (VENTOLIN HFA) 108 (90 Base) MCG/ACT inhaler    Sig: Inhale 2 puffs into the lungs every 6 (six) hours as needed for wheezing or shortness of breath.    Dispense:  18 each    Refill:  2   Lab Orders  No laboratory test(s) ordered today    Diagnostics: None.  Medication List:  Current Outpatient Medications  Medication Sig Dispense Refill   cetirizine (ZYRTEC) 10 MG tablet Take 10 mg by mouth daily.     cromolyn (OPTICROM) 4 % ophthalmic solution Place 1 drop into both eyes 4 (four) times daily as needed (itchy/watery eyes). 10 mL 3   EPINEPHrine 0.3 mg/0.3 mL IJ SOAJ injection Inject 0.3 mg into the  muscle as needed for anaphylaxis. 1 each 1   famotidine (PEPCID) 20 MG tablet Take 20 mg by mouth 2 (two) times daily.     fluticasone (FLONASE) 50 MCG/ACT nasal spray Place 2 sprays into both nostrils daily.      metoprolol succinate (TOPROL-XL) 50 MG 24 hr tablet Take 0.5 tablets (25 mg total) by mouth daily. 90 tablet 3   PARoxetine (PAXIL) 10 MG tablet 1 1/2 tablets daily. 135 tablet 4   valACYclovir (VALTREX) 1000 MG tablet Take 2 tabs and repeat in 12 hours as needed for cold sores. 30 tablet 1   albuterol (VENTOLIN HFA) 108 (90 Base) MCG/ACT inhaler Inhale 2 puffs into the lungs every 6 (six) hours as needed for wheezing or shortness of breath. 18 each 2   No current facility-administered medications for this visit.   Allergies: Allergies  Allergen Reactions   Amoxicillin Anaphylaxis   Ceftriaxone Anaphylaxis   Erythromycin Anaphylaxis   Levofloxacin Anaphylaxis   Rocephin [Ceftriaxone Sodium In Dextrose] Anaphylaxis   Shellfish Allergy Anaphylaxis   Tetracyclines & Related Anaphylaxis   Amoxicillin-Pot Clavulanate Hives   Clindamycin/Lincomycin Hives   Codeine Hives   Latex  Sulfa Antibiotics Hives   Vancomycin Rash   I reviewed her past medical history, social history, family history, and environmental history and no significant changes have been reported from previous visit on 05/18/2021.  Objective: Physical Exam Not obtained as encounter was done via telephone.   Previous notes and tests were reviewed.  I discussed the assessment and treatment plan with the patient. The patient was provided an opportunity to ask questions and all were answered. The patient agreed with the plan and demonstrated an understanding of the instructions.   The patient was advised to call back or seek an in-person evaluation if the symptoms worsen or if the condition fails to improve as anticipated.  I provided 29 minutes of non-face-to-face time during this encounter.  It was my  pleasure to participate in [place] care today. Please feel free to contact me with any questions or concerns.   Sincerely,  Gareth Morgan, FNP

## 2021-06-14 ENCOUNTER — Encounter: Payer: Self-pay | Admitting: Family Medicine

## 2021-06-14 ENCOUNTER — Ambulatory Visit: Payer: BC Managed Care – PPO | Admitting: Family Medicine

## 2021-06-14 ENCOUNTER — Other Ambulatory Visit: Payer: Self-pay

## 2021-06-14 VITALS — BP 108/64 | HR 80 | Temp 97.8°F | Resp 16

## 2021-06-14 DIAGNOSIS — L253 Unspecified contact dermatitis due to other chemical products: Secondary | ICD-10-CM | POA: Diagnosis not present

## 2021-06-14 NOTE — Patient Instructions (Addendum)
  Diagnostics: True Test patches placed.    Plan:   Allergic contact dermatitis - Instructions provided on care of the patches for the next 48 hours. Hailey Wilkins was instructed to avoid showering for the next 48 hours. [name] will follow up in 48 hours and 96 hours for patch readings.   - You may to continue to take your antihistamines while patches have been placed  Call the clinic if this treatment plan is not working well for you  Follow up in 2 days or sooner if needed.

## 2021-06-14 NOTE — Progress Notes (Addendum)
Follow-up Note  RE: Hailey Wilkins MRN: [id number] DOB: March 14, 1971 Date of Office Visit: 06/14/2021  Primary care provider: Ebbie Ridge, MD Referring provider: Ebbie Ridge, MD   Hailey Wilkins returns to the office today for the patch test placement, given suspected history of contact dermatitis.    Diagnostics: True Test patches placed.    Plan:   Allergic contact dermatitis - Instructions provided on care of the patches for the next 48 hours. Hailey Wilkins was instructed to avoid showering for the next 48 hours. Hailey Wilkins will follow up in 48 hours and 96 hours for patch readings.   - You may to continue to take your antihistamines while patches have been placed  Call the clinic if this treatment plan is not working well for you  Follow up in 2 days or sooner if needed.  ---------------------------------------------------- Attestation:  I reviewed the Nurse Practitioner's note and agree with the documented findings and plan of care. We discussed the patient and developed a plan concurrently.   Prudy Feeler, MD [facility]

## 2021-06-16 ENCOUNTER — Ambulatory Visit: Payer: BC Managed Care – PPO | Admitting: Allergy

## 2021-06-16 ENCOUNTER — Encounter: Payer: Self-pay | Admitting: Allergy

## 2021-06-16 ENCOUNTER — Other Ambulatory Visit: Payer: Self-pay

## 2021-06-16 VITALS — Temp 98.0°F

## 2021-06-16 DIAGNOSIS — L239 Allergic contact dermatitis, unspecified cause: Secondary | ICD-10-CM | POA: Insufficient documentation

## 2021-06-16 NOTE — Progress Notes (Signed)
   Follow Up Note  RE: Hailey Wilkins MRN: [id number] DOB: [birth year] Date of Office Visit: 06/16/2021  Referring provider: Ebbie Ridge, MD Primary care provider: Ebbie Ridge, MD  History of Present Illness: I had the pleasure of seeing Hailey Wilkins for a follow up visit at the [facility] on 06/16/2021. She is a 50 y.o. female, who is being followed for dermatitis. Today she is here for initial patch test interpretation, given suspected history of contact dermatitis.   Diagnostics:  TRUE TEST 48 hour reading:   T.R.U.E. Test - 06/16/21 1000     Time Antigen Placed 1026    Manufacturer Other    Location Back    Number of Test 36    Reading Interval Day 3   reading   1. Nickel Sulfate 0    2. Wool Alcohols 0    3. Neomycin Sulfate 0    4. Potassium Dichromate 0    5. Caine Mix 0    6. Fragrance Mix 0    7. Colophony 0    8. Paraben Mix 0    9. Negative Control 0    10. Balsam of [place] 0    11. Ethylenediamine Dihydrochloride 0    12. Cobalt Dichloride 0    13. p-tert Butylphenol Formaldehyde Resin 0    14. Epoxy Resin 0    15. Carba Mix 0    16.  Black Rubber Mix 0    17. Cl+ Me-Isothiazolinone 0    18. Quaternium-15 0    19. Methyldibromo Glutaronitrile 0    20. p-Phenylenediamine 0    21. Formaldehyde 0    22. Mercapto Mix 0    23. Thimerosal 0    24. Thiuram Mix 0    25. Diazolidinyl Urea 0    26. Quinoline Mix 0    27. Tixocortol-21-Pivalate 0    28. Gold Sodium Thiosulfate 1    29. Imidazolidinyl Urea 0    30. Budesonide 0    31. Hydrocortisone-17-Butyrate 0    32. Mercaptobenzothiazole 0    33. Bacitracin 0    34. Parthenolide 0    35. Disperse Blue 106 0    36. 2-Bromo-2-Nitropropane-1,3-diol 0              Assessment and Plan: Hailey Wilkins is a 50 y.o. female with: Allergic contact dermatitis TRUE test removed - 48 hour reading positive to gold only. Handout given.  Return in about 2 days (around 06/18/2021) for  Patch reading.  It was my pleasure to see Hailey Wilkins today and participate in her care. Please feel free to contact me with any questions or concerns.  Sincerely,  Rexene Alberts, DO Allergy & Immunology  [facility] office: [phone] [facility] office: [place] office: [phone]

## 2021-06-16 NOTE — Assessment & Plan Note (Signed)
TRUE test removed - 48 hour reading positive to gold only. Handout given.

## 2021-06-17 NOTE — Progress Notes (Signed)
Follow Up Note  RE: Hailey Wilkins MRN: [id number] DOB: 12-Jul-1971 Date of Office Visit: 06/18/2021  Referring provider: Ebbie Ridge, MD Primary care provider: Ebbie Ridge, MD  History of Present Illness: I had the pleasure of seeing Hailey Wilkins for a follow up visit at the [facility] [place] [facility] [place] on 06/18/2021. She is a 50 y.o. female, who is being followed for dermatitis. Today she is here for final patch test interpretation, given suspected history of contact dermatitis.   Patient wears gold rings with no issues but sometimes nickel containing things cause rash.  Patient went to [place] with her family and around that time she has broken out in a rash.  She has taken a new collagen supplement and also had salmon on this trip which she normally does not eat.   She has not eaten seafood since then and not sure about testing/re-introduction.  Diagnostics:  TRUE TEST 96 hour reading:   T.R.U.E. Test - 06/18/21 1300     Time Antigen Placed 1026    Manufacturer Other    Location Back    Number of Test 36    Reading Interval Day 3;Day 5   reading   1. Nickel Sulfate 0    2. Wool Alcohols 0    3. Neomycin Sulfate 0    4. Potassium Dichromate 0    5. Caine Mix 0    6. Fragrance Mix 0    7. Colophony 0    8. Paraben Mix 0    9. Negative Control 0    10. Balsam of [place] 0    11. Ethylenediamine Dihydrochloride 0    12. Cobalt Dichloride 0    13. p-tert Butylphenol Formaldehyde Resin 0    14. Epoxy Resin 0    15. Carba Mix 0    16.  Black Rubber Mix 0    17. Cl+ Me-Isothiazolinone 0    18. Quaternium-15 0    19. Methyldibromo Glutaronitrile 0    20. p-Phenylenediamine 0    21. Formaldehyde 0    22. Mercapto Mix 0    23. Thimerosal 0    24. Thiuram Mix 0    25. Diazolidinyl Urea 0    26. Quinoline Mix 0    27. Tixocortol-21-Pivalate 0    28. Gold Sodium Thiosulfate 1    29. Imidazolidinyl Urea 0    30. Budesonide 0    31.  Hydrocortisone-17-Butyrate 0    32. Mercaptobenzothiazole 0    33. Bacitracin 0    34. Parthenolide 0    35. Disperse Blue 106 0    36. 2-Bromo-2-Nitropropane-1,3-diol 0              Assessment and Plan: Hailey Wilkins is a 50 y.o. female with: Allergic contact dermatitis TRUE patch test only positive to gold. Patient wears gold jewelry with no issues and it's actually nickel that sometimes breaks her out.  Email sent with safe list.  Continue to avoid collagen supplements and medications on your allergy list.  Okay to wear gold jewelry as before - do not use cosmetics/personal care products with gold in it.   Other adverse food reactions, not elsewhere classified, subsequent encounter Past history - Reaction to shrimp at age 13. Tolerates finned fish with no issues.  Concerned about dairy, wheat, nuts, pumpkin, corn and tomato allergy as well as they seem to cause diarrhea and bloating. 2021 skin testing showed: Borderline positive to pistachios. 2021 bloodwork negative to  shrimp. Interim history - questionable reaction to salmon. Continue to avoid shellfish and salmon given clinical history. Recommend skin testing to salmon.   If interested we can schedule food challenge to shrimp.   Multiple drug allergies Past history - Patient has an extensive list of drug allergies mainly involving antibiotics. Her main reactions are usually rash/hives but there are some that cause perioral blistering - questions reactivation of herpes when taking antibiotics. The erythromycin/tetracyclines caused blistering in the mouth and throat. Failed in office oral penicillin challenge (pruritus); negative skin testing. Failed in office bactrim challenge (pruritus) Continue to avoid those 3 essential oil multivitamins and collagen products.  Continue to avoid medications on allergy list - including penicillin and bactrim as she had failed in office drug challenges.  Will not challenge erythromycin/tetracyclines due  to the blistering involving mucous membranes. Consider Levaquin challenge next - schedule when you are able to come off antihistamines (summer or winter months)  Return if symptoms worsen or fail to improve.  It was my pleasure to see Hailey Wilkins today and participate in her care. Please feel free to contact me with any questions or concerns.  Sincerely,  Rexene Alberts, DO Allergy & Immunology  [facility] office: [phone] [facility] office: [place] office: [phone]

## 2021-06-18 ENCOUNTER — Other Ambulatory Visit: Payer: Self-pay

## 2021-06-18 ENCOUNTER — Ambulatory Visit (INDEPENDENT_AMBULATORY_CARE_PROVIDER_SITE_OTHER): Payer: BC Managed Care – PPO | Admitting: Allergy

## 2021-06-18 ENCOUNTER — Encounter: Payer: Self-pay | Admitting: Allergy

## 2021-06-18 DIAGNOSIS — L239 Allergic contact dermatitis, unspecified cause: Secondary | ICD-10-CM

## 2021-06-18 DIAGNOSIS — T781XXD Other adverse food reactions, not elsewhere classified, subsequent encounter: Secondary | ICD-10-CM

## 2021-06-18 DIAGNOSIS — Z889 Allergy status to unspecified drugs, medicaments and biological substances status: Secondary | ICD-10-CM

## 2021-06-18 NOTE — Assessment & Plan Note (Signed)
TRUE patch test only positive to gold. Patient wears gold jewelry with no issues and it's actually nickel that sometimes breaks her out.   Email sent with safe list.   Continue to avoid collagen supplements and medications on your allergy list.   Okay to wear gold jewelry as before - do not use cosmetics/personal care products with gold in it.

## 2021-06-18 NOTE — Patient Instructions (Addendum)
Email sent with safe list.   Continue to avoid collagen supplements and medications on your allergy list.   Follow up if interested for skin testing for the seafood.

## 2021-06-18 NOTE — Assessment & Plan Note (Signed)
Past history - Reaction to shrimp at age 50. Tolerates finned fish with no issues.  Concerned about dairy, wheat, nuts, pumpkin, corn and tomato allergy as well as they seem to cause diarrhea and bloating. 2021 skin testing showed: Borderline positive to pistachios. 2021 bloodwork negative to shrimp. Interim history - questionable reaction to salmon.  Continue to avoid shellfish and salmon given clinical history.  Recommend skin testing to salmon.    If interested we can schedule food challenge to shrimp.

## 2021-06-18 NOTE — Assessment & Plan Note (Signed)
Past history - Patient has an extensive list of drug allergies mainly involving antibiotics. Her main reactions are usually rash/hives but there are some that cause perioral blistering - questions reactivation of herpes when taking antibiotics. The erythromycin/tetracyclines caused blistering in the mouth and throat. Failed in office oral penicillin challenge (pruritus); negative skin testing. Failed in office bactrim challenge (pruritus)  Continue to avoid those 3 essential oil multivitamins and collagen products.   Continue to avoid medications on allergy list - including penicillin and bactrim as she had failed in office drug challenges.   Will not challenge erythromycin/tetracyclines due to the blistering involving mucous membranes.  Consider Levaquin challenge next - schedule when you are able to come off antihistamines (summer or winter months)

## 2021-08-11 ENCOUNTER — Ambulatory Visit: Payer: BC Managed Care – PPO

## 2021-08-11 ENCOUNTER — Other Ambulatory Visit: Payer: Self-pay

## 2021-08-11 DIAGNOSIS — Z23 Encounter for immunization: Secondary | ICD-10-CM

## 2021-09-27 ENCOUNTER — Other Ambulatory Visit: Payer: Self-pay | Admitting: Obstetrics & Gynecology

## 2021-09-27 DIAGNOSIS — Z1231 Encounter for screening mammogram for malignant neoplasm of breast: Secondary | ICD-10-CM

## 2021-11-08 ENCOUNTER — Ambulatory Visit
Admission: RE | Admit: 2021-11-08 | Discharge: 2021-11-08 | Disposition: A | Discharge status: Home / Self Care | Payer: BC Managed Care – PPO | Source: Ambulatory Visit | Attending: Obstetrics & Gynecology | Admitting: Obstetrics & Gynecology

## 2021-11-08 ENCOUNTER — Other Ambulatory Visit: Payer: Self-pay

## 2021-11-08 DIAGNOSIS — Z1231 Encounter for screening mammogram for malignant neoplasm of breast: Secondary | ICD-10-CM | POA: Diagnosis not present

## 2022-01-19 ENCOUNTER — Encounter (HOSPITAL_BASED_OUTPATIENT_CLINIC_OR_DEPARTMENT_OTHER): Payer: Self-pay | Admitting: Obstetrics & Gynecology

## 2022-01-19 ENCOUNTER — Other Ambulatory Visit (HOSPITAL_BASED_OUTPATIENT_CLINIC_OR_DEPARTMENT_OTHER): Payer: Self-pay | Admitting: Obstetrics & Gynecology

## 2022-01-19 ENCOUNTER — Other Ambulatory Visit: Payer: Self-pay

## 2022-01-19 ENCOUNTER — Ambulatory Visit (INDEPENDENT_AMBULATORY_CARE_PROVIDER_SITE_OTHER): Payer: BC Managed Care – PPO | Admitting: Obstetrics & Gynecology

## 2022-01-19 VITALS — BP 121/70 | HR 70 | Wt 174.4 lb

## 2022-01-19 DIAGNOSIS — Z803 Family history of malignant neoplasm of breast: Secondary | ICD-10-CM

## 2022-01-19 DIAGNOSIS — K069 Disorder of gingiva and edentulous alveolar ridge, unspecified: Secondary | ICD-10-CM

## 2022-01-19 DIAGNOSIS — B009 Herpesviral infection, unspecified: Secondary | ICD-10-CM | POA: Diagnosis not present

## 2022-01-19 DIAGNOSIS — Z01419 Encounter for gynecological examination (general) (routine) without abnormal findings: Secondary | ICD-10-CM | POA: Diagnosis not present

## 2022-01-19 DIAGNOSIS — R232 Flushing: Secondary | ICD-10-CM

## 2022-01-19 MED ORDER — PAROXETINE HCL 10 MG PO TABS: ORAL_TABLET | ORAL | 4 refills | Status: DC

## 2022-01-19 MED ORDER — VALACYCLOVIR HCL 1 G PO TABS: ORAL_TABLET | ORAL | 1 refills | Status: DC

## 2022-01-19 NOTE — Progress Notes (Signed)
51 y.o. G2P2 Married White or Caucasian female here for annual exam.  Doing well except having some cervical spine issues.   ? ?Denies vaginal bleeding.  Hot flashes are worse.  She is on Paxil.   ? ?Pt is being followed by Dr. Maudie Mercury, Allergy and Immunology.  She had another allergic reaction to a gummy vitamin.   ? ?Leaving for [place] tomorrow for 25th anniversary trip. ? ?Patient's last menstrual period was 02/10/2018.          ?Sexually active: Yes.    ?The current method of family planning is post menopausal status.    ?Exercising: No.  ?Smoker:  no ? ?Health Maintenance: ?Pap:  01/14/2021 Negative ?History of abnormal Pap:  remot hx ?MMG:  11/08/2021 Negative ?Colonoscopy:  9/2/20212, follow up 7 years.  [facility] ?BMD:   guidelines reviewed ?Screening Labs: followed by Dr. Ola Spurr.  Last hba1c was 6.0. ? ? reports that she has never smoked. She has never used smokeless tobacco. She reports current alcohol use of about 5.0 standard drinks per week. She reports that she does not use drugs. ? ?Past Medical History:  ?Diagnosis Date  ? Allergic rhinitis   ? Dr. Doreene Nest, s/p allergy testing  ? Anemia   ? Asthma   ? Basal cell carcinoma of anterior chest   ? removed  ? Breast cyst 2011  ? bx benign  ? Complication of anesthesia   ? Depression   ? after father died, on [place] in past  ? Family history of breast cancer   ? Family history of pancreatic cancer   ? Family history of stomach cancer   ? GERD (gastroesophageal reflux disease)   ? Gestational diabetes   ? with first child  ? Headache(784.0)   ? History of chicken pox   ? Hypertension   ? Inappropriate sinus node tachycardia   ? Internal hemorrhoid 2006  ? dx during colon w/Dr Tiffany Kocher  ? Migraines   ? associated with menses in past, tx with excedrin, may last up to 1 week  ? Nonallergic rhinitis   ? Pericarditis   ? a. 10/2012 ETT: Ex time 9:00, No ST/T changes; b. 10/2012 Echo: EF 55-60%, no rwma, no pericardial effusion.  ? PONV (postoperative nausea and  vomiting)   ? surgery in 2017 at [facility], she had no n/v  ? Shingles 04/2011  ? Urticaria 11/10/2020  ? ? ?Past Surgical History:  ?Procedure Laterality Date  ? ANTERIOR CERVICAL DECOMP/DISCECTOMY FUSION N/A 06/12/2019  ? Procedure: ANTERIOR CERVICAL DECOMPRESSION/DISCECTOMY FUSION CERVICAL FOUR- CERVICAL FIVE, CERVICAL FIVE- CERVICAL SIX, CERVICAL SIX- CERVICAL SEVEN;  Surgeon: Jovita Gamma, MD;  Location: [facility];  Service: Neurosurgery;  Laterality: N/A;  ANTERIOR CERVICAL DECOMPRESSION/DISCECTOMY FUSION CERVICAL 4- CERVICAL 5, CERVICAL 5- CERVICAL 6, CERVICAL 6- CERVICAL7  ? BASAL CELL CARCINOMA EXCISION  2011  ? removed from chest/[facility]  ? BREAST BIOPSY Right 07/25/2018  ? stereo bx coil clip- CYSTIC PAPILLARY APOCRINE METAPLASIA  ? CHOLECYSTECTOMY  2010  ? [facility]  ? COLONOSCOPY    ? KNEE ARTHROSCOPY W/ MENISCAL REPAIR  1989  ? Right, Dr. Mauri Pole  ? [place], 2005  ? REDUCTION MAMMAPLASTY  2017  ? TONSILLECTOMY  1988  ? ? ?Current Outpatient Medications  ?Medication Sig Dispense Refill  ? albuterol (VENTOLIN HFA) 108 (90 Base) MCG/ACT inhaler Inhale 2 puffs into the lungs every 6 (six) hours as needed for wheezing or shortness of breath. 18 each 2  ?  cetirizine (ZYRTEC) 10 MG tablet Take 10 mg by mouth daily.    ? cromolyn (OPTICROM) 4 % ophthalmic solution Place 1 drop into both eyes 4 (four) times daily as needed (itchy/watery eyes). 10 mL 3  ? EPINEPHrine 0.3 mg/0.3 mL IJ SOAJ injection Inject 0.3 mg into the muscle as needed for anaphylaxis. 1 each 1  ? famotidine (PEPCID) 20 MG tablet Take 20 mg by mouth 2 (two) times daily.    ? fluticasone (FLONASE) 50 MCG/ACT nasal spray Place 2 sprays into both nostrils daily.     ? metoprolol succinate (TOPROL-XL) 50 MG 24 hr tablet Take 0.5 tablets (25 mg total) by mouth daily. 90 tablet 3  ? PARoxetine (PAXIL) 10 MG tablet 1 1/2 tablets daily. 135 tablet 4  ? valACYclovir (VALTREX) 1000 MG tablet Take 2 tabs  and repeat in 12 hours as needed for cold sores. 30 tablet 1  ? ?No current facility-administered medications for this visit.  ? ? ?Family History  ?Problem Relation Age of Onset  ? Hypertension Mother   ? Cancer Mother 52  ?     Breast Ca  ? Arthritis Mother   ? Breast cancer Mother 59  ?     2nd primary  ? Cancer Father 34  ?     Pancreatic  ? Diabetes Father   ? Hypertension Father   ? Hyperlipidemia Father   ? Heart disease Father   ? Cancer Sister 38  ?     Thyroid  ? Cancer Maternal Grandmother   ?     might have had cancer, died at 51  ? Alcohol abuse Maternal Grandfather   ? Pancreatic cancer Maternal Grandfather 36  ?     died in his 9's  ? Alcohol abuse Paternal Grandfather   ? Heart disease Paternal Grandfather   ? Hyperlipidemia Paternal Grandfather   ? Hypertension Paternal Grandfather   ? Other Paternal Grandmother   ?     brain tumor on pitutary gland  ? Brain cancer Paternal Grandmother 61  ?     pituitary tumor, died in her 29's  ? Breast cancer Cousin 65  ?     maternal couisn, is now in her 22's  ? Stomach cancer Maternal Aunt 75  ?     is now 4  ? Stomach cancer Maternal Aunt 75  ?     died in 54's  ? Breast cancer Cousin 59  ?     maternal cousin, is now 54  ? ? ?Review of Systems  ?All other systems reviewed and are negative. ? ?Exam:   ?BP 121/70 (BP Location: Left Arm, Patient Position: Sitting, Cuff Size: Large)   Pulse 70   Wt 174 lb 6.4 oz (79.1 kg)   LMP 02/10/2018   BMI 31.39 kg/m?     ? ?General appearance: alert, cooperative and appears stated age ?Head: Normocephalic, without obvious abnormality, atraumatic ?Neck: no adenopathy, supple, symmetrical, trachea midline and thyroid normal to inspection and palpation ?Lungs: clear to auscultation bilaterally ?Breasts: normal appearance, no masses or tenderness ?Heart: regular rate and rhythm ?Abdomen: soft, non-tender; bowel sounds normal; no masses,  no organomegaly ?Extremities: extremities normal, atraumatic, no cyanosis or  edema ?Skin: Skin color, texture, turgor normal. No rashes or lesions ?Lymph nodes: Cervical, supraclavicular, and axillary nodes normal. ?No abnormal inguinal nodes palpated ?Neurologic: Grossly normal ? ? ?Pelvic: External genitalia:  no lesions ?  Urethra:  normal appearing urethra with no masses, tenderness or lesions ?             Bartholins and Skenes: normal    ?             Vagina: normal appearing vagina with normal color and no discharge, no lesions ?             Cervix: no lesions ?             Pap taken: No. ?Bimanual Exam:  Uterus:  normal size, contour, position, consistency, mobility, non-tender ?             Adnexa: normal adnexa and no mass, fullness, tenderness ?              Rectovaginal: Confirms ?              Anus:  normal sphincter tone, no lesions ? ?Chaperone, Octaviano Batty, CMA, was present for exam. ? ?Assessment/Plan: ?1. Well woman exam with routine gynecological exam ?- pap neg with neg HR HPV 2022.  Not indicated today. ?- MMG up to date ?- colonoscopy 2021.  Follow up 7 years. ?- BMD discussed ?- vaccines reviewed/updated ? ?2. Hot flashes ?- PARoxetine (PAXIL) 10 MG tablet; 1 1/2 tablets daily.  Dispense: 135 tablet; Refill: 4 ? ?3. 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  Dispense: 30 tablet; Refill: 1 ? ?4. Family history of breast cancer ?- screening MRI discussed.  Pt aware to call if decides to schedule. ? ?

## 2022-01-27 ENCOUNTER — Ambulatory Visit: Payer: BC Managed Care – PPO | Admitting: Nurse Practitioner

## 2022-01-27 ENCOUNTER — Other Ambulatory Visit: Payer: Self-pay

## 2022-01-27 DIAGNOSIS — R1084 Generalized abdominal pain: Secondary | ICD-10-CM

## 2022-01-27 NOTE — Progress Notes (Signed)
?Virtual Visit Consent  ? ?Hailey Wilkins, you are scheduled for a virtual visit with a [facility] provider today.   ?  ?Just as with appointments in the office, your consent must be obtained to participate.  Your consent will be active for this visit and any virtual visit you may have with one of our providers in the next 365 days.   ?  ?If you have a MyChart account, a copy of this consent can be sent to you electronically.  All virtual visits are billed to your insurance company just like a traditional visit in the office.   ? ?As this is a virtual visit, video technology does not allow for your provider to perform a traditional examination.  This may limit your provider's ability to fully assess your condition.  If your provider identifies any concerns that need to be evaluated in person or the need to arrange testing (such as labs, EKG, etc.), we will make arrangements to do so.   ?  ?Although advances in technology are sophisticated, we cannot ensure that it will always work on either your end or our end.  If the connection with a video visit is poor, the visit may have to be switched to a telephone visit.  With either a video or telephone visit, we are not always able to ensure that we have a secure connection.    ? ?I need to obtain your verbal consent now.   Are you willing to proceed with your visit today?  ?  ?Hailey Wilkins has provided verbal consent on 01/27/2022 for a virtual visit (video or telephone). ?  ?Apolonio Schneiders, FNP  ? ?Date: 01/27/2022 9:59 AM ? ? ?Virtual Visit via Video Note  ? ?IApolonio Schneiders, connected with  [place]  ([id number], 1970-12-02) on 01/27/22 at 10:00 AM EST by a video-enabled telemedicine application and verified that I am speaking with the correct person using two identifiers. ? ?Location: ?Patient: Virtual Visit Location Patient: Home ?Provider: Virtual Visit Location Provider: Home Office ?  ?I discussed the limitations of evaluation and management  by telemedicine and the availability of in person appointments. The patient expressed understanding and agreed to proceed.   ? ?History of Present Illness: ?Hailey Wilkins is a 51 y.o. who identifies as a female who was assigned female at birth, and is being seen today for abdominal pain, nausea and diarrhea.  ? ?She has taken a COVID test and it was negative  ? ?She has urinated today  ? ?48 hours of abdominal pain and diarrhea, nausea without vomiting ?Recently received from [place] an inclusive resort  ? ?She has been feverish with chills ? ?She has had 2-3 crackers in the past 4 days.    ? ?Feels when she drinks something it goes straight through her ?Described abdominal pain as worse than child labor, hurts to even touch her abdomen.  ? ?She does not have a gallbladder  ? ? ?Problems:  ?Patient Active Problem List  ? Diagnosis Date Noted  ? Allergic conjunctivitis of both eyes 11/10/2020  ? Other adverse food reactions, not elsewhere classified, subsequent encounter 11/10/2020  ? History of fusion of cervical spine 07/01/2019  ? HNP (herniated nucleus pulposus), cervical 06/12/2019  ? Hyperkalemia 11/11/2018  ? Intermittent palpitations 11/11/2018  ? Hx of anaphylaxis 12/14/2017  ? Multiple drug allergies 12/14/2017  ? Family history of breast cancer   ? Family history of stomach cancer   ? Family  history of pancreatic cancer   ? Emotional lability 01/23/2015  ? Asthma, mild intermitent 12/18/2012  ? Other seasonal allergic rhinitis 12/18/2012  ? Sinus tachycardia 12/15/2012  ? Migraine 05/31/2012  ? Hot flashes 04/23/2012  ? Atopic dermatitis 04/05/2012  ? Shellfish allergy 04/05/2012  ? Sinusitis, chronic 04/05/2012  ?  ?Allergies:  ?Allergies  ?Allergen Reactions  ? Amoxicillin Anaphylaxis  ? Ceftriaxone Anaphylaxis  ? Erythromycin Anaphylaxis  ? Levofloxacin Anaphylaxis  ? Rocephin [Ceftriaxone Sodium In Dextrose] Anaphylaxis  ? Shellfish Allergy Anaphylaxis  ? Tetracyclines & Related  Anaphylaxis  ? Amoxicillin-Pot Clavulanate Hives  ? Clindamycin/Lincomycin Hives  ? Codeine Hives  ? Latex   ? Sulfa Antibiotics Hives  ? Vancomycin Rash  ? ?Medications:  ?Current Outpatient Medications:  ?  albuterol (VENTOLIN HFA) 108 (90 Base) MCG/ACT inhaler, Inhale 2 puffs into the lungs every 6 (six) hours as needed for wheezing or shortness of breath., Disp: 18 each, Rfl: 2 ?  cetirizine (ZYRTEC) 10 MG tablet, Take 10 mg by mouth daily., Disp: , Rfl:  ?  cromolyn (OPTICROM) 4 % ophthalmic solution, Place 1 drop into both eyes 4 (four) times daily as needed (itchy/watery eyes)., Disp: 10 mL, Rfl: 3 ?  EPINEPHrine 0.3 mg/0.3 mL IJ SOAJ injection, Inject 0.3 mg into the muscle as needed for anaphylaxis., Disp: 1 each, Rfl: 1 ?  famotidine (PEPCID) 20 MG tablet, Take 20 mg by mouth 2 (two) times daily., Disp: , Rfl:  ?  fluticasone (FLONASE) 50 MCG/ACT nasal spray, Place 2 sprays into both nostrils daily. , Disp: , Rfl:  ?  metoprolol succinate (TOPROL-XL) 50 MG 24 hr tablet, Take 0.5 tablets (25 mg total) by mouth daily., Disp: 90 tablet, Rfl: 3 ?  PARoxetine (PAXIL) 10 MG tablet, Take 1 tablet (10 mg total) by mouth in the morning and at bedtime., Disp: 60 tablet, Rfl: 3 ?  valACYclovir (VALTREX) 1000 MG tablet, Take 2 tabs and repeat in 12 hours as needed for cold sores., Disp: 30 tablet, Rfl: 1 ? ?Observations/Objective: ?This was a video visit no physical exam performed  ?Patient was in no acute distress during phone conversation  ? ? ?Assessment and Plan: ?1. Generalized abdominal pain ?Due to ongoing illness and risk for dehydration advised patient to report to ED for evaluation and hydration.  ?Also has a history of multiple antibiotic allergens  ? ?Patient is agreeable with plan and will report to ED now  ?   ? ?Follow Up Instructions: ?I discussed the assessment and treatment plan with the patient. The patient was provided an opportunity to ask questions and all were answered. The patient agreed with  the plan and demonstrated an understanding of the instructions.  A copy of instructions were sent to the patient via MyChart unless otherwise noted below.  ? ? ?The patient was advised to call back or seek an in-person evaluation if the symptoms worsen or if the condition fails to improve as anticipated. ? ?Time:  ?I spent 10 minutes with the patient via telehealth technology discussing the above problems/concerns.   ? ?Apolonio Schneiders, FNP  ?

## 2022-02-03 ENCOUNTER — Ambulatory Visit: Payer: BC Managed Care – PPO | Admitting: Nurse Practitioner

## 2022-02-03 ENCOUNTER — Other Ambulatory Visit: Payer: Self-pay

## 2022-02-03 NOTE — Progress Notes (Signed)
Patient called to request IV fluid replacement. [facility] is not currently providing that service. Patient has had diarrhea for the past 10 days since [place]. She has an extensive list of antibiotic allergies. Feels as soon as she eats or drinks anything she is in the bathroom within the hour. She has lost 8lbs.  ?Did visit PCP this past week and had labwork and stool samples that returned +Enteropathogenic E Coli  ? ?Advised patient due to antibiotic allergy history, day ten of symptoms and + e. Coli on recent labs would recommend ED visit for hydration and consideration for antibiotic therapy. May consult with PCP prior for second opinion  ?

## 2022-02-15 ENCOUNTER — Ambulatory Visit: Payer: BC Managed Care – PPO | Admitting: Nurse Practitioner

## 2022-02-15 ENCOUNTER — Encounter: Payer: Self-pay | Admitting: Nurse Practitioner

## 2022-02-15 ENCOUNTER — Other Ambulatory Visit: Payer: Self-pay

## 2022-02-15 VITALS — BP 110/68 | HR 71 | Temp 97.7°F | Resp 16

## 2022-02-15 DIAGNOSIS — R110 Nausea: Secondary | ICD-10-CM

## 2022-02-15 DIAGNOSIS — K5792 Diverticulitis of intestine, part unspecified, without perforation or abscess without bleeding: Secondary | ICD-10-CM

## 2022-02-15 DIAGNOSIS — A09 Infectious gastroenteritis and colitis, unspecified: Secondary | ICD-10-CM

## 2022-02-15 MED ORDER — ONDANSETRON HCL 4 MG PO TABS: 4.0000 mg | ORAL_TABLET | Freq: Three times a day (TID) | ORAL | 0 refills | Status: AC | PRN

## 2022-02-15 NOTE — Progress Notes (Signed)
? ?Subjective:  ? ? Patient ID: Hailey Wilkins, female    DOB: 25-Jul-1971, 51 y.o.   MRN: [id number] ? ?HPI ? ?51 year old female presenting to [facility] for follow up after recent hospitalization. She continues to have episodes of diarrhea, this does not wake her at night, on average 2-3 episodes daily. Nausea at times when eating. She is trying to stay with a bland diet and is avoiding dairy. Mostly eating boiled chicken and rice. Denies recent episodes of vomiting. Continue to have mid epigastric discomfort, worsened with meals.  ? ?Patient was in [place] at the beginning of the month and returned with acute vomiting and diarrhea. After 4 days of consistent symptoms she was seen at the [facility] ED. She was hydrated at that time and discharged from ED. ? ?Symptoms continued for a total of 10 days, diarrhea and abdominal pain with nausea and no vomiting. She had stool sampled sent out through PCP that resulted with: ?Enteropathogenic E Coli -  ? ?She returned to [place] at that time and was admitted for IV treatment due to underlying antibiotic allergy list/inability to be treated with oral medications  ? ?While hospitalized she was treated with 8 rounds of IV Aztreonam  ? ?She also had a CT performed while in house:  ?FINDINGS:  ? ?LOWER CHEST: The visualized lung bases are clear. No pleural effusions are seen.  ? ?LIVER: Normal liver contour.  No focal liver lesions.  ? ?BILIARY: Status post cholecystectomy. Mild central biliary dilation.  ? ?SPLEEN: Normal in size and contour.  ? ?PANCREAS: Normal pancreatic contour.  No focal lesions.  No ductal dilation.  ? ?ADRENAL GLANDS: Normal appearance of the adrenal glands.  ? ?KIDNEYS/URETERS: Symmetric renal enhancement.  No hydronephrosis.  No solid renal mass. Left renal cyst.  ? ?BLADDER: Unremarkable.  ? ?REPRODUCTIVE ORGANS: The uterus is present.  ? ?GI TRACT: The stomach appears unremarkable. The loops of small bowel are normal in  caliber. There is colonic diverticulosis with mild stranding adjacent to the distal descending colon (series 3#63 & series 6#32). No free air or drainable fluid collection is identified. The appendix appears unremarkable.  ? ?PERITONEUM, RETROPERITONEUM AND MESENTERY: No free air.  No ascites.  No fluid collection.  ? ?LYMPH NODES: No adenopathy.  ? ?VESSELS: Hepatic and portal veins are patent.  Normal caliber aorta.    ? ?BONES and SOFT TISSUES: There are degenerative changes of the spine. No focal soft tissue lesions. ? ?She had a colonoscopy through [facility] in 2006  ?Most recently had a colonoscopy through [facility] clinic 2021  ? ?She does see an allergist and has had limited food allergy testing in the past.  ?Today's Vitals  ? 02/15/22 0900  ?BP: 110/68  ?Pulse: 71  ?Resp: 16  ?Temp: 97.7 ?F (36.5 ?C)  ?TempSrc: Tympanic  ?SpO2: 98%  ? ?There is no height or weight on file to calculate BMI.  ? ?Review of Systems  ?Constitutional:  Positive for appetite change.  ?HENT: Negative.    ?Respiratory: Negative.    ?Cardiovascular: Negative.   ?Gastrointestinal:  Positive for abdominal pain, diarrhea and nausea.  ?Endocrine: Negative.   ?Genitourinary: Negative.   ?Neurological: Negative.   ?Psychiatric/Behavioral: Negative.    ?Allergies  ?Allergen Reactions  ? Amoxicillin Anaphylaxis  ? Ceftriaxone Anaphylaxis  ? Erythromycin Anaphylaxis  ? Levofloxacin Anaphylaxis  ? Rocephin [Ceftriaxone Sodium In Dextrose] Anaphylaxis  ? Shellfish Allergy Anaphylaxis  ? Tetracyclines & Related Anaphylaxis  ?  Amoxicillin-Pot Clavulanate Hives  ? Clindamycin/Lincomycin Hives  ? Codeine Hives  ? Latex   ? Sulfa Antibiotics Hives  ? Vancomycin Rash  ?  ? ?Current Outpatient Medications  ?Medication Instructions  ? albuterol (VENTOLIN HFA) 108 (90 Base) MCG/ACT inhaler 2 puffs, Inhalation, Every 6 hours PRN  ? cetirizine (ZYRTEC) 10 mg, Daily  ? cromolyn (OPTICROM) 4 % ophthalmic solution 1 drop, Both Eyes, 4 times daily PRN  ?  EPINEPHrine (EPI-PEN) 0.3 mg, Intramuscular, As needed  ? famotidine (PEPCID) 20 mg, Oral, 2 times daily  ? fluticasone (FLONASE) 50 MCG/ACT nasal spray 2 sprays, Each Nare, Daily  ? metoprolol succinate (TOPROL-XL) 25 mg, Oral, Daily  ? ondansetron (ZOFRAN) 4 mg, Oral, Every 8 hours PRN  ? PARoxetine (PAXIL) 10 mg, Oral, 2 times daily  ? valACYclovir (VALTREX) 1000 MG tablet Take 2 tabs and repeat in 12 hours as needed for cold sores.  ?  ?   ?Objective:  ? Physical Exam ?HENT:  ?   Head: Normocephalic.  ?Pulmonary:  ?   Effort: Pulmonary effort is normal.  ?Abdominal:  ?   General: Bowel sounds are increased.  ?   Palpations: Abdomen is soft. There is no mass.  ?   Tenderness: There is no guarding or rebound.  ? ? ?   Comments: Pan location without rebound pain  ?Skin: ?   General: Skin is warm and dry.  ?Neurological:  ?   General: No focal deficit present.  ?   Mental Status: She is alert.  ?Psychiatric:     ?   Mood and Affect: Mood normal.  ? ? ? ? ? ?   ?Assessment & Plan:  ?1. Nausea ? ?- Ambulatory referral to Gastroenterology ?- ondansetron (ZOFRAN) 4 MG tablet; Take 1 tablet (4 mg total) by mouth every 8 (eight) hours as needed for up to 7 days for nausea or vomiting.  Dispense: 20 tablet; Refill: 0 ? ?2. Diarrhea of infectious origin ? ?- Ambulatory referral to Gastroenterology ? ?3. Diverticulitis ? ?- Ambulatory referral to Gastroenterology ?   ?Continue bland diet, increase Pepcid to twice daily  ?Push fluids continue to monitor hydration  ?  ? ?

## 2022-02-15 NOTE — Addendum Note (Signed)
Addended by: Apolonio Schneiders E on: 02/15/2022 11:41 AM ? ? Modules accepted: Orders ? ?

## 2022-02-18 ENCOUNTER — Other Ambulatory Visit (HOSPITAL_BASED_OUTPATIENT_CLINIC_OR_DEPARTMENT_OTHER): Payer: Self-pay | Admitting: Obstetrics & Gynecology

## 2022-02-18 ENCOUNTER — Encounter (HOSPITAL_BASED_OUTPATIENT_CLINIC_OR_DEPARTMENT_OTHER): Payer: Self-pay | Admitting: Obstetrics & Gynecology

## 2022-02-18 DIAGNOSIS — R232 Flushing: Secondary | ICD-10-CM

## 2022-02-18 MED ORDER — PAROXETINE HCL 10 MG PO TABS: 10.0000 mg | ORAL_TABLET | Freq: Every day | ORAL | 3 refills | Status: DC

## 2022-02-23 ENCOUNTER — Encounter: Payer: Self-pay | Admitting: Nurse Practitioner

## 2022-03-02 ENCOUNTER — Ambulatory Visit: Payer: BC Managed Care – PPO | Admitting: Nurse Practitioner

## 2022-03-02 ENCOUNTER — Ambulatory Visit
Admission: RE | Admit: 2022-03-02 | Discharge: 2022-03-02 | Disposition: A | Discharge status: Home / Self Care | Payer: BC Managed Care – PPO | Attending: Nurse Practitioner | Admitting: Nurse Practitioner

## 2022-03-02 ENCOUNTER — Ambulatory Visit
Admission: RE | Admit: 2022-03-02 | Discharge: 2022-03-02 | Disposition: A | Discharge status: Home / Self Care | Payer: BC Managed Care – PPO | Source: Ambulatory Visit | Attending: Nurse Practitioner | Admitting: Nurse Practitioner

## 2022-03-02 ENCOUNTER — Encounter: Payer: Self-pay | Admitting: Nurse Practitioner

## 2022-03-02 VITALS — BP 112/80 | HR 67 | Temp 98.1°F | Resp 16

## 2022-03-02 DIAGNOSIS — R197 Diarrhea, unspecified: Secondary | ICD-10-CM

## 2022-03-02 DIAGNOSIS — R1011 Right upper quadrant pain: Secondary | ICD-10-CM | POA: Insufficient documentation

## 2022-03-02 NOTE — Progress Notes (Signed)
? ?  Subjective:  ? ? Patient ID: Hailey Wilkins, female    DOB: 07-02-1971, 51 y.o.   MRN: [id number] ? ?HPI ? ?25 year year old female presenting to [facility] for follow up on ongoing GI illness. See previous notes.  ?She was improving overall, and then had regression over the past week after she tried to eat some more regular foods.  ? ?She now has a return in abdominal pain, and diarrhea. She has used Tramadol for pain, Bentyl for stomach distress and continues to fear to eat anything.  ? ?So far today she has only had coffee and water.  ? ?She has an appointment with GI in one week  ? ?Today's Vitals  ? 03/02/22 1358  ?BP: 112/80  ?Pulse: 67  ?Resp: 16  ?Temp: 98.1 ?F (36.7 ?C)  ?SpO2: 98%  ? ?There is no height or weight on file to calculate BMI.  ? ?Review of Systems  ?Constitutional: Negative.   ?HENT: Negative.    ?Respiratory: Negative.    ?Cardiovascular: Negative.   ?Gastrointestinal:  Positive for abdominal pain and diarrhea.  ?Genitourinary: Negative.   ?Musculoskeletal: Negative.   ? ?   ?Objective:  ? Physical Exam ?HENT:  ?   Head: Normocephalic.  ?Abdominal:  ?   General: Bowel sounds are decreased.  ?   Palpations: Abdomen is soft. There is no mass.  ?   Tenderness: There is abdominal tenderness in the right upper quadrant, epigastric area and left upper quadrant. There is guarding. There is no rebound.  ? ? ?Skin: ?   General: Skin is warm.  ?Neurological:  ?   General: No focal deficit present.  ?   Mental Status: She is alert.  ?Psychiatric:     ?   Mood and Affect: Mood normal.  ? ? ?CLINICAL DATA:  Abdominal pain ?  ?EXAM: ?ABDOMEN - 1 VIEW ?  ?COMPARISON:  None. ?  ?FINDINGS: ?The bowel gas pattern is normal. No radio-opaque calculi or other ?significant radiographic abnormality are seen. ?  ?IMPRESSION: ?Negative. ?  ?  ?Electronically Signed ?  By: Kathreen Devoid M.D. ?  On: 03/02/2022 14:57 ? ? ?   ?Assessment & Plan:  ?1. Right upper quadrant abdominal pain ? ?- DG Abd  1 View; results above (negative)  ? ?2. Diarrhea of presumed infectious origin ? ?- C difficile Toxigenic Culture ?- Ova and parasite examination ?- Stool Culture ?   ?Will follow up with stool results and continue with referral to GI ?No evidence of impaction on Abdominal CT will continue with BRAT diet and pushing fluids  ?May use Bentyl as needed ?RTC with new symptoms] ?Seek emergent care for fever, aggressive onset of nausea and vomiting as discussed  ?

## 2022-03-04 LAB — OVA AND PARASITE EXAMINATION

## 2022-03-06 LAB — STOOL CULTURE: E coli, Shiga toxin Assay: NEGATIVE

## 2022-03-07 LAB — C DIFFICILE TOXIGENIC CULTURE

## 2022-03-07 NOTE — Progress Notes (Signed)
Hailey Wilkins, ? ?[facility] called and said they could not run that test, but there was another one they could do that was equivalent. Test number is [phone] the name of the test is Clostridioides difficile Toxin Gene, NAA. [facility] said the specimen was stable and they could do that one. I told them to go ahead and run it. Do you want to wait until that one comes back? Then if it is not what you are looking for we can have her go to [facility] if she still wants to? Let me know ?

## 2022-03-09 ENCOUNTER — Ambulatory Visit
Admission: RE | Admit: 2022-03-09 | Discharge: 2022-03-09 | Disposition: A | Discharge status: Home / Self Care | Payer: BC Managed Care – PPO | Source: Ambulatory Visit | Attending: Gastroenterology | Admitting: Gastroenterology

## 2022-03-09 ENCOUNTER — Other Ambulatory Visit: Payer: Self-pay | Admitting: Gastroenterology

## 2022-03-09 ENCOUNTER — Encounter: Payer: Self-pay | Admitting: Nurse Practitioner

## 2022-03-09 ENCOUNTER — Telehealth: Payer: Self-pay | Admitting: Nurse Practitioner

## 2022-03-09 DIAGNOSIS — K921 Melena: Secondary | ICD-10-CM

## 2022-03-09 DIAGNOSIS — Z8719 Personal history of other diseases of the digestive system: Secondary | ICD-10-CM | POA: Diagnosis present

## 2022-03-09 DIAGNOSIS — R197 Diarrhea, unspecified: Secondary | ICD-10-CM | POA: Insufficient documentation

## 2022-03-09 DIAGNOSIS — R1084 Generalized abdominal pain: Secondary | ICD-10-CM

## 2022-03-09 LAB — SPECIMEN STATUS REPORT

## 2022-03-09 LAB — CLOSTRIDIUM DIFFICILE BY PCR: Toxigenic C. Difficile by PCR: NEGATIVE

## 2022-03-09 MED ORDER — IOHEXOL 300 MG/ML  SOLN
100.0000 mL | Freq: Once | INTRAMUSCULAR | Status: AC | PRN
  Administered 2022-03-09: 100 mL via INTRAVENOUS

## 2022-03-09 NOTE — Telephone Encounter (Signed)
Left vm

## 2022-03-11 ENCOUNTER — Ambulatory Visit: Payer: BC Managed Care – PPO | Admitting: Nurse Practitioner

## 2022-03-11 DIAGNOSIS — N3000 Acute cystitis without hematuria: Secondary | ICD-10-CM

## 2022-03-11 LAB — POCT URINALYSIS DIPSTICK
Bilirubin, UA: NEGATIVE
Blood, UA: NEGATIVE
Glucose, UA: NEGATIVE
Ketones, UA: NEGATIVE
Nitrite, UA: NEGATIVE
Protein, UA: NEGATIVE
Spec Grav, UA: 1.015 (ref 1.010–1.025)
Urobilinogen, UA: 0.2 E.U./dL
pH, UA: 5.5 (ref 5.0–8.0)

## 2022-03-11 MED ORDER — NITROFURANTOIN MONOHYD MACRO 100 MG PO CAPS: 100.0000 mg | ORAL_CAPSULE | Freq: Two times a day (BID) | ORAL | 0 refills | Status: AC

## 2022-03-11 NOTE — Progress Notes (Signed)
? ?  Subjective:  ? ? Patient ID: Hailey Wilkins, female    DOB: 01-Sep-1971, 51 y.o.   MRN: [id number] ? ?HPI ? ?51 year old female presenting to [facility] with complaints of central lower abdominal discomfort she has felt from UTIs in the past. She has been dealing with post intestinal e/coli infection recently and was at GI this past week for further workup. She had a CT scan performed at that time to r/o diverticulitis.  ? ?She denies fever, dysuria or frequency with urination ? ?She has not had a BM for the past 4 days is sticking with a strict BRAT diet  ? ? ?   ?Objective:  ? Physical Exam ?HENT:  ?   Head: Normocephalic.  ?Pulmonary:  ?   Effort: Pulmonary effort is normal.  ?Neurological:  ?   Mental Status: She is alert.  ? ? ? ? ? Latest Reference Range & Units 03/11/22 09:32  ?Bilirubin, UA  Negative  ?Clarity, UA  clear  ?Color, UA  yellow  ?Glucose Negative  Negative  ?Ketones, UA  Negative  ?Leukocytes,UA Negative  Moderate (2+) !  ?Nitrite, UA  Negative  ?pH, UA 5.0 - 8.0  5.5  ?Protein,UA Negative  Negative  ?Specific Gravity, UA 1.010 - 1.025  1.015  ?Urobilinogen, UA 0.2 or 1.0 E.U./dL 0.2  ?RBC, UA  Negative  ?!: Data is abnormal ?   ?Assessment & Plan:  ?1. Acute cystitis without hematuria ? ?- nitrofurantoin, macrocrystal-monohydrate, (MACROBID) 100 MG capsule; Take 1 capsule (100 mg total) by mouth 2 (two) times daily for 5 days.  Dispense: 10 capsule; Refill: 0 ?- POCT urinalysis dipstick ?   ?Will followup with culture results when available  ?RTC as needed  ? ?

## 2022-03-15 ENCOUNTER — Encounter: Payer: Self-pay | Admitting: Nurse Practitioner

## 2022-03-15 LAB — URINE CULTURE

## 2022-03-16 ENCOUNTER — Telehealth: Payer: BC Managed Care – PPO | Admitting: Nurse Practitioner

## 2022-04-06 ENCOUNTER — Other Ambulatory Visit
Admission: RE | Admit: 2022-04-06 | Discharge: 2022-04-06 | Disposition: A | Discharge status: Home / Self Care | Payer: BC Managed Care – PPO | Source: Ambulatory Visit | Attending: Gastroenterology | Admitting: Gastroenterology

## 2022-04-06 DIAGNOSIS — R152 Fecal urgency: Secondary | ICD-10-CM | POA: Diagnosis not present

## 2022-04-06 DIAGNOSIS — R194 Change in bowel habit: Secondary | ICD-10-CM | POA: Insufficient documentation

## 2022-04-06 DIAGNOSIS — R197 Diarrhea, unspecified: Secondary | ICD-10-CM | POA: Diagnosis present

## 2022-04-06 DIAGNOSIS — A0472 Enterocolitis due to Clostridium difficile, not specified as recurrent: Secondary | ICD-10-CM | POA: Insufficient documentation

## 2022-04-06 LAB — C DIFFICILE QUICK SCREEN W PCR REFLEX
C Diff antigen: NEGATIVE
C Diff interpretation: NOT DETECTED
C Diff toxin: NEGATIVE

## 2022-04-07 LAB — GASTROINTESTINAL PANEL BY PCR, STOOL (REPLACES STOOL CULTURE)

## 2022-04-14 ENCOUNTER — Other Ambulatory Visit: Payer: Self-pay | Admitting: Cardiovascular Disease

## 2022-04-27 ENCOUNTER — Other Ambulatory Visit
Admission: RE | Admit: 2022-04-27 | Discharge: 2022-04-27 | Disposition: A | Discharge status: Home / Self Care | Payer: BC Managed Care – PPO | Source: Ambulatory Visit | Attending: Gastroenterology | Admitting: Gastroenterology

## 2022-04-27 DIAGNOSIS — R1084 Generalized abdominal pain: Secondary | ICD-10-CM | POA: Diagnosis present

## 2022-04-27 DIAGNOSIS — B9620 Unspecified Escherichia coli [E. coli] as the cause of diseases classified elsewhere: Secondary | ICD-10-CM | POA: Diagnosis not present

## 2022-04-27 DIAGNOSIS — A0472 Enterocolitis due to Clostridium difficile, not specified as recurrent: Secondary | ICD-10-CM | POA: Diagnosis not present

## 2022-04-27 DIAGNOSIS — R197 Diarrhea, unspecified: Secondary | ICD-10-CM | POA: Insufficient documentation

## 2022-04-27 LAB — C DIFFICILE QUICK SCREEN W PCR REFLEX
C Diff antigen: NEGATIVE
C Diff interpretation: NOT DETECTED
C Diff toxin: NEGATIVE

## 2022-04-28 LAB — GASTROINTESTINAL PANEL BY PCR, STOOL (REPLACES STOOL CULTURE)

## 2022-05-03 LAB — GIARDIA, EIA; OVA/PARASITE: Giardia Ag, Stl: NEGATIVE

## 2022-05-03 LAB — O&P RESULT

## 2022-05-09 LAB — CALPROTECTIN, FECAL: Calprotectin, Fecal: 17 ug/g (ref 0–120)

## 2022-06-01 NOTE — Progress Notes (Signed)
1 

## 2022-07-12 ENCOUNTER — Other Ambulatory Visit: Payer: Self-pay | Admitting: Cardiovascular Disease

## 2022-10-11 ENCOUNTER — Other Ambulatory Visit: Payer: Self-pay | Admitting: Obstetrics & Gynecology

## 2022-10-11 DIAGNOSIS — Z1231 Encounter for screening mammogram for malignant neoplasm of breast: Secondary | ICD-10-CM

## 2022-11-10 ENCOUNTER — Ambulatory Visit
Admission: RE | Admit: 2022-11-10 | Discharge: 2022-11-10 | Disposition: A | Discharge status: Home / Self Care | Payer: 59 | Source: Ambulatory Visit | Attending: Obstetrics & Gynecology | Admitting: Obstetrics & Gynecology

## 2022-11-10 DIAGNOSIS — Z1231 Encounter for screening mammogram for malignant neoplasm of breast: Secondary | ICD-10-CM | POA: Diagnosis present

## 2022-11-23 NOTE — Progress Notes (Unsigned)
Cardiology Clinic Note   Patient Name: Hailey Wilkins [facility] Date of Encounter: 11/24/2022  Primary Care Provider:  Ebbie Ridge, MD Primary Cardiologist:  Kathlyn Sacramento, MD  Patient Profile    52 year old female with a history of inappropriate sinus tachycardia, pleuritic chest pain with questionable pericarditis in 2013, migraines, anemia, asthma, and depression who presents today for follow-up of tachycardia.  Past Medical History    Past Medical History:  Diagnosis Date   Allergic rhinitis    Dr. Doreene Nest, s/p allergy testing   Anemia    Asthma    Basal cell carcinoma of anterior chest    removed   Breast cyst 2011   bx benign   Complication of anesthesia    Depression    after father died, on [place] in past   Family history of breast cancer    Family history of pancreatic cancer    Family history of stomach cancer    GERD (gastroesophageal reflux disease)    Gestational diabetes    with first child   Headache(784.0)    History of chicken pox    Hypertension    Inappropriate sinus node tachycardia    Internal hemorrhoid 2006   dx during colon w/Dr Tiffany Kocher   Migraines    associated with menses in past, tx with excedrin, may last up to 1 week   Nonallergic rhinitis    Pericarditis    a. 10/2012 ETT: Ex time 9:00, No ST/T changes; b. 10/2012 Echo: EF 55-60%, no rwma, no pericardial effusion.   PONV (postoperative nausea and vomiting)    surgery in 2017 at [place], she had no n/v   Shingles 04/2011   Urticaria 11/10/2020   Past Surgical History:  Procedure Laterality Date   ANTERIOR CERVICAL DECOMP/DISCECTOMY FUSION N/A 06/12/2019   Procedure: ANTERIOR CERVICAL DECOMPRESSION/DISCECTOMY FUSION CERVICAL FOUR- CERVICAL FIVE, CERVICAL FIVE- CERVICAL SIX, CERVICAL SIX- CERVICAL SEVEN;  Surgeon: Jovita Gamma, MD;  Location: [place];  Service: Neurosurgery;  Laterality: N/A;  ANTERIOR CERVICAL DECOMPRESSION/DISCECTOMY FUSION CERVICAL 4- CERVICAL 5,  CERVICAL 5- CERVICAL 6, CERVICAL 6- CERVICAL7   BASAL CELL CARCINOMA EXCISION  2011   removed from chest/[facility]   BREAST BIOPSY Right 07/25/2018   stereo bx coil clip- CYSTIC PAPILLARY APOCRINE METAPLASIA   CHOLECYSTECTOMY  2010   [facility]   COLONOSCOPY     KNEE ARTHROSCOPY W/ MENISCAL REPAIR  1989   Right, Dr. Mauri Pole   NASAL SINUS SURGERY  1999, 2005   REDUCTION MAMMAPLASTY  2017   TONSILLECTOMY  1988    Allergies  Allergies  Allergen Reactions   Amoxicillin Anaphylaxis and Hives   Amoxicillin-Pot Clavulanate Hives, Anaphylaxis and Rash   Ceftriaxone Anaphylaxis   Clindamycin Hives   Codeine Hives, Nausea And Vomiting and Rash   Erythromycin Anaphylaxis and Hives    MOUTH ULCERS   Erythromycin Base Hives   Levofloxacin Anaphylaxis   Lincomycin Hcl Hives   Rocephin [Ceftriaxone Sodium In Dextrose] Anaphylaxis   Shellfish Allergy Anaphylaxis   Tetracyclines & Related Anaphylaxis   Clindamycin/Lincomycin Hives   Latex    Sulfa Antibiotics Hives   Vancomycin Rash    History of Present Illness    Hailey Wilkins is a 52 year old female with previously mentioned past medical history including pleuritic chest pain and dyspnea on exertion with the evaluation in 2013 including normal exercise treadmill testing and echocardiogram that revealed an EF of 55 to 60%, no regional wall motion abnormalities, and no pericardial  effusion.  At one time there was question of pericarditis which led to her pleuritic chest pain.  Other history includes inappropriate sinus tachycardia managed with oral beta-blocker therapy, GERD, migraines, anemia, asthma, depression.  She was last seen in cardiology clinic in July 2022 where she had a few episodes of tachycardia while she was recently at [employer] and likely was dehydrated.  She also suffered a severe allergic reaction and introduce shellfish or new supplements that she was taking.  She did have a syncopal episode  and EMS was called.  She was noted to be tachycardic with orthostatic hypotension.  There were no changes in medication made or any further testing was ordered.  She returns to clinic today stating that she is doing well. She has had an eventful year.  From 25th wedding anniversary of her husband took her on a trip to [place].  Unfortunately the year she went on to be getting food poisoning and ended up with C. difficile and E. coli.  Those instances caused her to have 2 hospitalizations at [facility] and she continued to test positive for E. coli in June of this year as well as parasites.  She notices that she has palpitations with anything over 1 glass of red wine but other than that she has no issues with her palpitations.  She denies any chest pain, shortness of breath, peripheral edema, or lightheadedness/dizziness.   Home Medications    Current Outpatient Medications  Medication Sig Dispense Refill   albuterol (VENTOLIN HFA) 108 (90 Base) MCG/ACT inhaler Inhale 2 puffs into the lungs every 6 (six) hours as needed for wheezing or shortness of breath. 18 each 2   cetirizine (ZYRTEC) 10 MG tablet Take 10 mg by mouth daily.     EPINEPHrine 0.3 mg/0.3 mL IJ SOAJ injection Inject 0.3 mg into the muscle as needed for anaphylaxis. 1 each 1   fluticasone (FLONASE) 50 MCG/ACT nasal spray Place 2 sprays into both nostrils daily.      metoprolol succinate (TOPROL-XL) 50 MG 24 hr tablet TAKE 0.5 TABLETS BY MOUTH DAILY. PLEASE SCHEDULE APPOINTMENT FOR FURTHER REFILLS. THANK YOU! 45 tablet 0   valACYclovir (VALTREX) 1000 MG tablet Take 2 tabs and repeat in 12 hours as needed for cold sores. 30 tablet 1   cromolyn (OPTICROM) 4 % ophthalmic solution Place 1 drop into both eyes 4 (four) times daily as needed (itchy/watery eyes). 10 mL 3   famotidine (PEPCID) 20 MG tablet Take 20 mg by mouth 2 (two) times daily.     PARoxetine (PAXIL) 10 MG tablet Take 1 tablet (10 mg total) by mouth daily. 90 tablet 3   No  current facility-administered medications for this visit.     Family History    Family History  Problem Relation Age of Onset   Hypertension Mother    Cancer Mother 56       Breast Ca   Arthritis Mother    Breast cancer Mother 60       2nd primary   Cancer Father 27       Pancreatic   Diabetes Father    Hypertension Father    Hyperlipidemia Father    Heart disease Father    Cancer Sister 30       Thyroid   Cancer Maternal Grandmother        might have had cancer, died at 46   Alcohol abuse Maternal Grandfather    Pancreatic cancer Maternal Grandfather 37  died in his 94's   Alcohol abuse Paternal Grandfather    Heart disease Paternal Grandfather    Hyperlipidemia Paternal Grandfather    Hypertension Paternal Grandfather    Other Paternal Grandmother        brain tumor on pitutary gland   Brain cancer Paternal Grandmother 61       pituitary tumor, died in her 48's   Breast cancer Cousin 52       maternal couisn, is now in her 63's   Stomach cancer Maternal Aunt 75       is now 65   Stomach cancer Maternal Aunt 75       died in 33's   Breast cancer Cousin 22       maternal cousin, is now 21   She indicated that her mother is alive. She indicated that her father is deceased. She indicated that her sister is alive. She indicated that her brother is alive. She indicated that the status of her maternal grandmother is unknown. She indicated that the status of her maternal grandfather is unknown. She indicated that the status of her paternal grandmother is unknown. She indicated that the status of her paternal grandfather is unknown. She indicated that both of her sons are alive. She indicated that two of her five maternal aunts are alive. She indicated that both of her maternal uncles are deceased. She indicated that her paternal uncle is alive.  Social History    Social History   Socioeconomic History   Marital status: Married    Spouse name: Not on file   Number of  children: 2   Years of education: Not on file   Highest education level: Not on file  Occupational History   Occupation: Payroll & Benefits admin - [employer]     Employer: [employer]  Tobacco Use   Smoking status: Never   Smokeless tobacco: Never  Vaping Use   Vaping Use: Never used  Substance and Sexual Activity   Alcohol use: Yes    Alcohol/week: 5.0 standard drinks of alcohol    Types: 5 Glasses of wine per week    Comment: weekly   Drug use: No   Sexual activity: Yes    Partners: Male    Birth control/protection: Post-menopausal  Other Topics Concern   Not on file  Social History Narrative   Lives in [place] with husband and Pind[place] and 11YO. Works at [employer]. Dog and Cat.      Regular Exercise -  GYM, wts, walking, elliptical - 3 times a week x 1 hour   Daily Caffeine Use:  3 cups coffee, 1 diet soda            Social Determinants of Health   Financial Resource Strain: Not on file  Food Insecurity: Not on file  Transportation Needs: Not on file  Physical Activity: Not on file  Stress: Not on file  Social Connections: Not on file  Intimate Partner Violence: Not on file     Review of Systems    General:  No chills, fever, night sweats or weight changes.  Cardiovascular:  No chest pain, dyspnea on exertion, edema, orthopnea, occasional palpitations, paroxysmal nocturnal dyspnea. Dermatological: No rash, lesions/masses Respiratory: No cough, dyspnea Urologic: No hematuria, dysuria Abdominal:   No nausea, vomiting, diarrhea, bright red blood per rectum, melena, or hematemesis Neurologic:  No visual changes, wkns, changes in mental status. All other systems reviewed and are otherwise negative except as noted above.  Physical Exam    VS:  BP 124/78 (BP Location: Left Arm, Patient Position: Sitting, Cuff Size: Normal)   Pulse 81   Ht 5' 2.5" (1.588 m)   Wt 161 lb 12.8 oz (73.4 kg)   LMP 01/19/2018 (Approximate)   SpO2 98%   BMI 29.12 kg/m  ,  BMI Body mass index is 29.12 kg/m.     GEN: Well nourished, well developed, in no acute distress. HEENT: normal. Neck: Supple, no JVD, carotid bruits, or masses. Cardiac: RRR, no murmurs, rubs, or gallops. No clubbing, cyanosis, edema.  Radials 2+/PT 2+ and equal bilaterally.  Respiratory:  Respirations regular and unlabored, clear to auscultation bilaterally. GI: Soft, nontender, nondistended, BS + x 4. MS: no deformity or atrophy. Skin: warm and dry, no rash. Neuro:  Strength and sensation are intact. Psych: Normal affect.  Accessory Clinical Findings    ECG personally reviewed by me today-normal sinus rhythm with a rate of 81- No acute changes  Lab Results  Component Value Date   WBC 7.3 06/01/2021   HGB 13.0 06/01/2021   HCT 38.7 06/01/2021   MCV 88.2 06/01/2021   PLT 290 06/01/2021   Lab Results  Component Value Date   CREATININE 0.83 06/01/2021   BUN 13 06/01/2021   NA 139 06/01/2021   K 3.9 06/01/2021   CL 105 06/01/2021   CO2 29 06/01/2021   Lab Results  Component Value Date   ALT 23 06/01/2021   AST 22 06/01/2021   ALKPHOS 69 06/01/2021   BILITOT 0.5 06/01/2021   Lab Results  Component Value Date   CHOL 145 05/04/2017   HDL 48 05/04/2017   LDLCALC 84 05/04/2017   TRIG 64 05/04/2017   CHOLHDL 3.0 05/04/2017    Lab Results  Component Value Date   HGBA1C 5.8 (H) 11/01/2019    Assessment & Plan   1.  Inappropriate sinus tachycardia with little to no symptoms since continuing on metoprolol XL 50 mg daily.  She only has occasional palpitations when she drinks more than 1 glass of red wine.  This is significant improvement in symptoms.  No changes were made to her medication regimen today.  2.  C-diff/E. coli/ova and parasites and she has been treated for over the last year.  She has had 2 hospitalizations and a [facility].  Continues to have some GI related symptoms.  She continues to follow with gastroenterology.  She has been slowly advancing her diet as  tolerated.  3.  History of syncope that was likely related to dehydration and orthostasis.  She has had no recurrent events even with dehydration and hospitalization after her trip to [place] where she required C. difficile and E. coli.  4.  Disposition patient return to clinic to see MD/APP in 1 year or sooner if needed.  Keino Placencia, NP 11/24/2022, 9:13 AM

## 2022-11-24 ENCOUNTER — Encounter: Payer: Self-pay | Admitting: Cardiology

## 2022-11-24 ENCOUNTER — Ambulatory Visit: Payer: 59 | Attending: Cardiology | Admitting: Cardiology

## 2022-11-24 VITALS — BP 124/78 | HR 81 | Ht 62.5 in | Wt 161.8 lb

## 2022-11-24 DIAGNOSIS — Z87898 Personal history of other specified conditions: Secondary | ICD-10-CM

## 2022-11-24 DIAGNOSIS — I4711 Inappropriate sinus tachycardia, so stated: Secondary | ICD-10-CM | POA: Diagnosis not present

## 2022-11-24 DIAGNOSIS — A0472 Enterocolitis due to Clostridium difficile, not specified as recurrent: Secondary | ICD-10-CM

## 2022-11-24 NOTE — Patient Instructions (Signed)
Medication Instructions:   Your physician recommends that you continue on your current medications as directed. Please refer to the Current Medication list given to you today.  *If you need a refill on your cardiac medications before your next appointment, please call your pharmacy*   Lab Work:  NONE  If you have labs (blood work) drawn today and your tests are completely normal, you will receive your results only by: [place] (if you have MyChart) OR A paper copy in the mail If you have any lab test that is abnormal or we need to change your treatment, we will call you to review the results.   Testing/Procedures:  NONE   Follow-Up: At [facility], you and your health needs are our priority.  As part of our continuing mission to provide you with exceptional heart care, we have created designated Provider Care Teams.  These Care Teams include your primary Cardiologist (physician) and Advanced Practice Providers (APPs -  Physician Assistants and Nurse Practitioners) who all work together to provide you with the care you need, when you need it.  We recommend signing up for the patient portal called "MyChart".  Sign up information is provided on this After Visit Summary.  MyChart is used to connect with patients for Virtual Visits (Telemedicine).  Patients are able to view lab/test results, encounter notes, upcoming appointments, etc.  Non-urgent messages can be sent to your provider as well.   To learn more about what you can do with MyChart, go to [email or web address].    Your next appointment:   12 month(s)  The format for your next appointment:   In Person  Provider:   You may see Kathlyn Sacramento, MD or one of the following Advanced Practice Providers on your designated Care Team:   Murray Hodgkins, NP Christell Faith, PA-C Cadence Kathlen Mody, PA-C Gerrie Nordmann, NP Important Information About Sugar

## 2022-11-28 NOTE — Addendum Note (Signed)
Addended by: James Ivanoff D on: 11/28/2022 03:17 PM   Modules accepted: Orders

## 2023-01-23 ENCOUNTER — Ambulatory Visit (HOSPITAL_BASED_OUTPATIENT_CLINIC_OR_DEPARTMENT_OTHER): Payer: BC Managed Care – PPO | Admitting: Obstetrics & Gynecology

## 2023-02-27 ENCOUNTER — Ambulatory Visit (HOSPITAL_BASED_OUTPATIENT_CLINIC_OR_DEPARTMENT_OTHER): Payer: 59 | Admitting: Obstetrics & Gynecology

## 2023-06-22 ENCOUNTER — Ambulatory Visit (HOSPITAL_BASED_OUTPATIENT_CLINIC_OR_DEPARTMENT_OTHER): Payer: 59 | Admitting: Obstetrics & Gynecology

## 2023-07-18 ENCOUNTER — Ambulatory Visit (INDEPENDENT_AMBULATORY_CARE_PROVIDER_SITE_OTHER): Payer: 59 | Admitting: Obstetrics & Gynecology

## 2023-07-18 ENCOUNTER — Encounter (HOSPITAL_BASED_OUTPATIENT_CLINIC_OR_DEPARTMENT_OTHER): Payer: Self-pay | Admitting: Obstetrics & Gynecology

## 2023-07-18 VITALS — BP 124/82 | HR 76 | Ht 62.5 in | Wt 165.4 lb

## 2023-07-18 DIAGNOSIS — K069 Disorder of gingiva and edentulous alveolar ridge, unspecified: Secondary | ICD-10-CM

## 2023-07-18 DIAGNOSIS — Z01419 Encounter for gynecological examination (general) (routine) without abnormal findings: Secondary | ICD-10-CM | POA: Diagnosis not present

## 2023-07-18 DIAGNOSIS — R232 Flushing: Secondary | ICD-10-CM | POA: Diagnosis not present

## 2023-07-18 DIAGNOSIS — B009 Herpesviral infection, unspecified: Secondary | ICD-10-CM

## 2023-07-18 MED ORDER — VALACYCLOVIR HCL 1 G PO TABS: ORAL_TABLET | ORAL | 1 refills | Status: DC

## 2023-07-18 NOTE — Progress Notes (Unsigned)
52 y.o. G2P2 Married White or Caucasian female here for annual exam.  Went to [place] after I saw her last.  She ended up getting e coli, c diff from salad that she ate right before coming home.  She ended up in the ER in [place].  She went to ER x 2 and ultimately was hospitalized for 5 days.  Did see GI for follow up. Tested positive for E coli for months.  Ended up with holistic provider and it took several more weeks of treatment to get this done.  Was treated with ivermectin as well due to testing positive for parasites.    She stopped her paxil during this whole experience.  Does not feel this needs to be restarted.  Does need RF for valtrex.  Patient's last menstrual period was 01/19/2018 (approximate).          Sexually active: Yes.    The current method of family planning is post menopausal status.    Smoker:  no  Health Maintenance: Pap:  01/14/2021 Negative History of abnormal Pap:  no MMG:  11/10/2022 Negative Colonoscopy:  07/23/2020, follow up 7 years.   Screening Labs: done with Dr. Mayford Knife.  Will scan into EPIC.   reports that she has never smoked. She has never used smokeless tobacco. She reports current alcohol use of about 5.0 standard drinks of alcohol per week. She reports that she does not use drugs.  Past Medical History:  Diagnosis Date   Allergic rhinitis    Dr. Bethena Midget, s/p allergy testing   Anemia    Asthma    Basal cell carcinoma of anterior chest    removed   Breast cyst 2011   bx benign   Complication of anesthesia    Depression    after father died, on [place] in past   Family history of breast cancer    Family history of pancreatic cancer    Family history of stomach cancer    GERD (gastroesophageal reflux disease)    Gestational diabetes    with first child   Headache(784.0)    History of chicken pox    Hypertension    Inappropriate sinus node tachycardia    Internal hemorrhoid 2006   dx during colon w/Dr Markham Jordan   Migraines    associated  with menses in past, tx with excedrin, may last up to 1 week   Nonallergic rhinitis    Pericarditis    a. 10/2012 ETT: Ex time 9:00, No ST/T changes; b. 10/2012 Echo: EF 55-60%, no rwma, no pericardial effusion.   PONV (postoperative nausea and vomiting)    [facility] no [facility]/v   Shingles 04/2011   Urticaria 11/10/2020    Past Surgical History:  Procedure Laterality Date   ANTERIOR CERVICAL DECOMP/DISCECTOMY FUSION N/A 06/12/2019   Procedure: ANTERIOR CERVICAL DECOMPRESSION/DISCECTOMY FUSION CERVICAL FOUR- CERVICAL FIVE, CERVICAL FIVE- CERVICAL SIX, CERVICAL SIX- CERVICAL SEVEN;  Surgeon: Shirlean Kelly, MD;  Location: [facility];  Service: Neurosurgery;  Laterality: N/A;  ANTERIOR CERVICAL DECOMPRESSION/DISCECTOMY FUSION CERVICAL 4- CERVICAL 5, CERVICAL 5- CERVICAL 6, CERVICAL 6- CERVICAL7   BASAL CELL CARCINOMA EXCISION  2011   removed from chest/[facility]   BREAST BIOPSY Right 07/25/2018   stereo bx coil clip- CYSTIC PAPILLARY APOCRINE METAPLASIA   CHOLECYSTECTOMY  2010   [facility]   COLONOSCOPY     KNEE ARTHROSCOPY W/ MENISCAL REPAIR  1989   Right, Dr. Gerrit Heck   NASAL SINUS SURGERY  1999, 2005   REDUCTION MAMMAPLASTY  2017   TONSILLECTOMY  1988    Current Outpatient Medications  Medication Sig Dispense Refill   albuterol (VENTOLIN HFA) 108 (90 Base) MCG/ACT inhaler Inhale 2 puffs into the lungs every 6 (six) hours as needed for wheezing or shortness of breath. 18 each 2   cetirizine (ZYRTEC) 10 MG tablet Take 10 mg by mouth daily.     EPINEPHrine 0.3 mg/0.3 mL IJ SOAJ injection Inject 0.3 mg into the muscle as needed for anaphylaxis. 1 each 1   fluticasone (FLONASE) 50 MCG/ACT nasal spray Place 2 sprays into both nostrils daily.      valACYclovir (VALTREX) 1000 MG tablet Take 2 tabs and repeat in 12 hours as needed for cold sores. 30 tablet 1   No current facility-administered medications for this visit.    Family History   Problem Relation Age of Onset   Hypertension Mother    Cancer Mother 9       Breast Ca   Arthritis Mother    Breast cancer Mother 43       2nd primary   Cancer Father 34       Pancreatic   Diabetes Father    Hypertension Father    Hyperlipidemia Father    Heart disease Father    Cancer Sister 77       Thyroid   Cancer Maternal Grandmother        might have had cancer, died at 18   Alcohol abuse Maternal Grandfather    Pancreatic cancer Maternal Grandfather 36       died in his 68's   Alcohol abuse Paternal Grandfather    Heart disease Paternal Grandfather    Hyperlipidemia Paternal Grandfather    Hypertension Paternal Grandfather    Other Paternal Grandmother        brain tumor on pitutary gland   Brain cancer Paternal Grandmother 28       pituitary tumor, died in her 41's   Breast cancer Cousin 19       maternal couisn, is now in her 88's   Stomach cancer Maternal Aunt 66       is now 34   Stomach cancer Maternal Aunt 75       died in 10's   Breast cancer Cousin 72       maternal cousin, is now 22    ROS: Constitutional: negative Genitourinary:negative  Exam:   BP 124/82 (BP Location: Right Arm, Patient Position: Sitting, Cuff Size: Large)   Pulse 76   Ht 5' 2.5" (1.588 m) Comment: reported  Wt 165 lb 6.4 oz (75 kg)   LMP 01/19/2018 (Approximate)   BMI 29.77 kg/m   Height: 5' 2.5" (158.8 cm) (reported)  General appearance: alert, cooperative and appears stated age Head: Normocephalic, without obvious abnormality, atraumatic Neck: no adenopathy, supple, symmetrical, trachea midline and thyroid normal to inspection and palpation Lungs: clear to auscultation bilaterally Breasts: normal appearance, no masses or tenderness Heart: regular rate and rhythm Abdomen: soft, non-tender; bowel sounds normal; no masses,  no organomegaly Extremities: extremities normal, atraumatic, no cyanosis or edema Skin: Skin color, texture, turgor normal. No rashes or  lesions Lymph nodes: Cervical, supraclavicular, and axillary nodes normal. No abnormal inguinal nodes palpated Neurologic: Grossly normal   Pelvic: External genitalia:  no lesions              Urethra:  normal appearing urethra with no masses, tenderness or lesions  Bartholins and Skenes: normal                 Vagina: normal appearing vagina with normal color and no discharge, no lesions              Cervix: no lesions              Pap taken: No. Bimanual Exam:  Uterus:  normal size, contour, position, consistency, mobility, non-tender              Adnexa: normal adnexa and no mass, fullness, tenderness               Rectal exam declined  Chaperone, Ina Homes, CMA, was present for exam.  Assessment/Plan: 1. Well woman exam with routine gynecological exam - Pap smear neg 2022 with neg HR HPV - Mammogram *** - Colonoscopy *** - Bone mineral density *** - lab work done with PCP, Dr. Marland Kitchen - vaccines reviewed/updated  Pap:  01/14/2021 Negative History of abnormal Pap:  no MMG:  11/10/2022 Negative Colonoscopy:  07/23/2020, follow up 7 years.   Screening Labs: done with Dr. Mayford Knife.  Will scan into EPIC.   2. 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  Dispense: 30 tablet; Refill: 1  3. HSV-1 infection *** - valACYclovir (VALTREX) 1000 MG tablet; Take 2 tabs and repeat in 12 hours as needed for cold sores.  Dispense: 30 tablet; Refill: 1  4. Hot flashes ***

## 2023-08-12 ENCOUNTER — Other Ambulatory Visit (HOSPITAL_BASED_OUTPATIENT_CLINIC_OR_DEPARTMENT_OTHER): Payer: Self-pay | Admitting: Obstetrics & Gynecology

## 2023-08-12 DIAGNOSIS — B009 Herpesviral infection, unspecified: Secondary | ICD-10-CM

## 2023-11-07 ENCOUNTER — Other Ambulatory Visit: Payer: Self-pay | Admitting: Obstetrics & Gynecology

## 2023-11-07 DIAGNOSIS — Z1231 Encounter for screening mammogram for malignant neoplasm of breast: Secondary | ICD-10-CM

## 2023-11-23 ENCOUNTER — Ambulatory Visit
Admission: RE | Admit: 2023-11-23 | Discharge: 2023-11-23 | Disposition: A | Discharge status: Home / Self Care | Payer: PRIVATE HEALTH INSURANCE | Source: Ambulatory Visit | Attending: Obstetrics & Gynecology | Admitting: Obstetrics & Gynecology

## 2023-11-23 DIAGNOSIS — Z1231 Encounter for screening mammogram for malignant neoplasm of breast: Secondary | ICD-10-CM | POA: Insufficient documentation

## 2023-12-14 ENCOUNTER — Encounter: Payer: Self-pay | Admitting: Emergency Medicine

## 2023-12-14 ENCOUNTER — Other Ambulatory Visit: Payer: Self-pay | Admitting: Emergency Medicine

## 2023-12-14 DIAGNOSIS — K5792 Diverticulitis of intestine, part unspecified, without perforation or abscess without bleeding: Secondary | ICD-10-CM

## 2023-12-14 DIAGNOSIS — K5732 Diverticulitis of large intestine without perforation or abscess without bleeding: Secondary | ICD-10-CM

## 2023-12-20 ENCOUNTER — Ambulatory Visit
Admission: RE | Admit: 2023-12-20 | Discharge: 2023-12-20 | Disposition: A | Discharge status: Home / Self Care | Payer: 59 | Source: Ambulatory Visit | Attending: Emergency Medicine | Admitting: Emergency Medicine

## 2023-12-20 DIAGNOSIS — K5792 Diverticulitis of intestine, part unspecified, without perforation or abscess without bleeding: Secondary | ICD-10-CM

## 2023-12-29 ENCOUNTER — Encounter (HOSPITAL_BASED_OUTPATIENT_CLINIC_OR_DEPARTMENT_OTHER): Payer: Self-pay | Admitting: Obstetrics & Gynecology

## 2024-01-02 ENCOUNTER — Other Ambulatory Visit (HOSPITAL_BASED_OUTPATIENT_CLINIC_OR_DEPARTMENT_OTHER): Payer: Self-pay | Admitting: Obstetrics & Gynecology

## 2024-01-02 DIAGNOSIS — Z8719 Personal history of other diseases of the digestive system: Secondary | ICD-10-CM

## 2024-03-11 NOTE — Progress Notes (Signed)
 Cardiology Office Note    Date:  03/12/2024   ID:  Nandana, Krolikowski 1971-10-31, MRN [id number]  PCP:  Judge Notice, MD  Cardiologist:  Antionette Kirks, MD  Electrophysiologist:  None   Chief Complaint: Follow-up  History of Present Illness:   Hailey Wilkins is a 53 y.o. female with history of inappropriate sinus tachycardia, pleuritic chest pain with questionable pericarditis in 2013, migraines, anemia, GERD, allergies, and asthma who presents for follow-up of inappropriate sinus tachycardia.  In 2013 she was evaluated for pleuritic chest pain and dyspnea.  Echo showed an EF of 55 to 60%, no regional wall motion abnormalities, and normal LV diastolic function.  Treadmill stress test showed no evidence of ischemia.  There was question of whether pericarditis led to her pleuritic chest pain.  In 2022, while at [facility], she had few episodes of tachycardia, possibly precipitated by dehydration.  She suffered a severe allergic reaction with shellfish.  She had a syncopal episode and EMS was called.  She was tachycardic and orthostatic.  No further testing or changes to pharmacotherapy were pursued.  She was last seen in the office in 11/2022 and reported food poisoning with C. difficile and E. coli while in [place].  These caused her to have 2 hospitalizations at [facility].  She reported palpitations with anything over one glass of red wine.  She comes in accompanied by her husband today who recently had an MI.  Prior to this, the patient was dealing with a URI.  From a cardiac perspective she is doing well and without symptoms concerning for angina or cardiac decompensation.  She has been off of metoprolol  for approximately 6 months with well-controlled tachypalpitations/heart rates.  She does note an increase in palpitations when drinking red wine, which she enjoys.  However, she now tries to avoid alcohol given symptomatic elevated heart rates.  No dizziness, presyncope, or  syncope.  As outlined above, she has been under increased stress with recent URI which is followed by MI in her husband.  With this, she was not eating, drinking, or sleeping as well as she does have baseline.  Overall, she feels like she is doing well from a cardiac perspective.   Labs independently reviewed: 12/2022 - Hgb 13, PLT 325, BUN 12, serum creatinine 0.77, potassium 4.6, albumin 4.6, AST/ALT normal, TC 183, TG 189, HDL 53, LDL 107, A1c 5.8, TSH normal  Past Medical History:  Diagnosis Date   Allergic rhinitis    Dr. Cleone Dad, s/p allergy  testing   Anemia    Asthma    Basal cell carcinoma of anterior chest    removed   Breast cyst 2011   bx benign   Complication of anesthesia    Depression    after father died, on P[place] in past   Family history of breast cancer    Family history of pancreatic cancer    Family history of stomach cancer    GERD (gastroesophageal reflux disease)    Gestational diabetes    with first child   Headache(784.0)    History of chicken pox    Hypertension    Inappropriate sinus node tachycardia (HCC)    Internal hemorrhoid 2006   dx during colon w/Dr Dawne Euler   Migraines    associated with menses in past, tx with excedrin, may last up to 1 week   Nonallergic rhinitis    Pericarditis    a. 10/2012 ETT: Ex time 9:00, No ST/T changes; b. 10/2012 Echo:  EF 55-60%, no rwma, no pericardial effusion.   PONV (postoperative nausea and vomiting)    [facility] she had no n/v   Shingles 04/2011   Urticaria 11/10/2020    Past Surgical History:  Procedure Laterality Date   ANTERIOR CERVICAL DECOMP/DISCECTOMY FUSION N/A 06/12/2019   Procedure: ANTERIOR CERVICAL DECOMPRESSION/DISCECTOMY FUSION CERVICAL FOUR- CERVICAL FIVE, CERVICAL FIVE- CERVICAL SIX, CERVICAL SIX- CERVICAL SEVEN;  Surgeon: Yvonna Herder, MD;  Location: [facility] OR;  Service: Neurosurgery;  Laterality: N/A;  ANTERIOR CERVICAL DECOMPRESSION/DISCECTOMY FUSION CERVICAL 4-  CERVICAL 5, CERVICAL 5- CERVICAL 6, CERVICAL 6- CERVICAL7   BASAL CELL CARCINOMA EXCISION  2011   removed from chest/[facility]   BREAST BIOPSY Right 07/25/2018   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   COLONOSCOPY     KNEE ARTHROSCOPY W/ MENISCAL REPAIR  1989   Right, Dr. Glinda Lapping   NASAL SINUS SURGERY  1999, 2005   REDUCTION MAMMAPLASTY  2017   TONSILLECTOMY  1988    Current Medications: Current Meds  Medication Sig   albuterol  (VENTOLIN  HFA) 108 (90 Base) MCG/ACT inhaler Inhale 2 puffs into the lungs every 6 (six) hours as needed for wheezing or shortness of breath.   cetirizine (ZYRTEC) 10 MG tablet Take 10 mg by mouth daily.   EPINEPHrine  0.3 mg/0.3 mL IJ SOAJ injection Inject 0.3 mg into the muscle as needed for anaphylaxis.   fluticasone  (FLONASE ) 50 MCG/ACT nasal spray Place 2 sprays into both nostrils daily.    metoprolol  tartrate (LOPRESSOR ) 25 MG tablet Take 0.5 tablets (12.5 mg total) by mouth 2 (two) times daily as needed (for palpitations).   ondansetron  (ZOFRAN -ODT) 4 MG disintegrating tablet Take 4 mg by mouth every 6 (six) hours as needed.   valACYclovir  (VALTREX ) 1000 MG tablet Take 2 tabs and repeat in 12 hours as needed for cold sores.    Allergies:   Amoxicillin , Ceftriaxone, Clindamycin, Codeine, Erythromycin, Levofloxacin, Lincomycin hcl, Shellfish allergy , Tetracyclines & related, Latex, Sulfa  antibiotics, and Vancomycin   Social History   Socioeconomic History   Marital status: Married    Spouse name: Not on file   Number of children: 2   Years of education: Not on file   Highest education level: Not on file  Occupational History   Occupation: Payroll & Benefits admin - [employer]     Employer: [employer]  Tobacco Use   Smoking status: Never   Smokeless tobacco: Never  Vaping Use   Vaping status: Never Used  Substance and Sexual Activity   Alcohol use:  Yes    Alcohol/week: 5.0 standard drinks of alcohol    Types: 5 Glasses of wine per week    Comment: weekly   Drug use: No   Sexual activity: Yes    Partners: Male    Birth control/protection: Post-menopausal  Other Topics Concern   Not on file  Social History Narrative   Lives in [place] with husband and 19YO and 11YO. Works at [employer]. Dog and Cat.      Regular Exercise -  GYM, wts, walking, elliptical - 3 times a week x 1 hour   Daily Caffeine Use:  3 cups coffee, 1 diet soda            Social Drivers of Health   Financial Resource Strain: Low Risk  (02/04/2022)   Received from [facility], [facility]   Overall Financial Resource Strain (  CARDIA)    Difficulty of Paying Living Expenses: Not hard at all  Food Insecurity: No Food Insecurity (02/04/2022)   Received from [facility], [facility]   Hunger Vital Sign    Worried About Running Out of Food in the Last Year: Never true    Ran Out of Food in the Last Year: Never true  Transportation Needs: No Transportation Needs (02/04/2022)   Received from [facility], [facility]   [facility] - Transportation    Lack of Transportation (Medical): No    Lack of Transportation (Non-Medical): No  Physical Activity: Not on file  Stress: Not on file  Social Connections: Not on file     Family History:  The patient's family history includes Alcohol abuse in her maternal grandfather and paternal grandfather; Arthritis in her mother; Brain cancer (age of onset: 59) in her paternal grandmother; Breast cancer (age of onset: 8) in her cousin; Breast cancer (age of onset: 75) in her cousin; Breast cancer (age of onset: 29) in her mother; Cancer in her maternal grandmother; Cancer (age of onset: 49) in her sister; Cancer (age of onset: 45) in her mother; Cancer (age of onset: 40) in her father; Diabetes in her father; Heart disease in her father and paternal grandfather; Hyperlipidemia in her father and paternal  grandfather; Hypertension in her father, mother, and paternal grandfather; Other in her paternal grandmother; Pancreatic cancer (age of onset: 69) in her maternal grandfather; Stomach cancer (age of onset: 59) in her maternal aunt and maternal aunt.  ROS:   12-point review of systems is negative unless otherwise noted in the HPI.   EKGs/Labs/Other Studies Reviewed:    Studies reviewed were summarized above. The additional studies were reviewed today:  2D echo 11/20/2012: - Left ventricle: The cavity size was normal. Wall thickness    was normal. Systolic function was normal. The estimated    ejection fraction was in the range of 55% to 60%. Wall    motion was normal; there were no regional wall motion    abnormalities. Left ventricular diastolic function    parameters were normal.  - Pericardium, extracardiac: There was no pericardial    effusion.  Impressions:   - Normal study.    EKG:  EKG is ordered today.  The EKG ordered today demonstrates NSR with sinus arrhythmia, 88 bpm, no acute ST-T changes  Recent Labs: No results found for requested labs within last 365 days.  Recent Lipid Panel    Component Value Date/Time   CHOL 145 05/04/2017 1039   TRIG 64 05/04/2017 1039   HDL 48 05/04/2017 1039   CHOLHDL 3.0 05/04/2017 1039   CHOLHDL 2.7 12/09/2014 1509   VLDL 18 12/09/2014 1509   LDLCALC 84 05/04/2017 1039    PHYSICAL EXAM:    VS:  BP 118/76 (BP Location: Left Arm)   Pulse 88   Resp 16   Ht 5\' 2"  (1.575 m)   Wt 160 lb (72.6 kg)   LMP 01/19/2018 (Approximate)   SpO2 98%   BMI 29.26 kg/m   BMI: Body mass index is 29.26 kg/m.  Physical Exam Vitals reviewed.  Constitutional:      Appearance: She is well-developed.  HENT:     Head: Normocephalic and atraumatic.  Eyes:     General:        Right eye: No discharge.        Left eye: No discharge.  Cardiovascular:     Rate and Rhythm: Normal  rate and regular rhythm.     Heart sounds: Normal heart sounds, S1  normal and S2 normal. Heart sounds not distant. No midsystolic click and no opening snap. No murmur heard.    No friction rub.  Pulmonary:     Effort: Pulmonary effort is normal. No respiratory distress.     Breath sounds: Normal breath sounds. No decreased breath sounds, wheezing, rhonchi or rales.  Chest:     Chest wall: No tenderness.  Musculoskeletal:     Cervical back: Normal range of motion.  Skin:    General: Skin is warm and dry.     Nails: There is no clubbing.  Neurological:     Mental Status: She is alert and oriented to person, place, and time.  Psychiatric:        Speech: Speech normal.        Behavior: Behavior normal.        Thought Content: Thought content normal.        Judgment: Judgment normal.     Wt Readings from Last 3 Encounters:  03/12/24 160 lb (72.6 kg)  07/18/23 165 lb 6.4 oz (75 kg)  11/24/22 161 lb 12.8 oz (73.4 kg)     ASSESSMENT & PLAN:   Inappropriate sinus tachycardia: Symptoms overall have been well-controlled, even while off of Toprol -XL.  Main trigger at this point appears to be alcohol, which she does enjoy an occasional glass of red wine.  We will undergo a trial of as needed Lopressor  12.5 mg twice daily for sustained tachypalpitations.  She will notify our office if she is needing this on a regular basis.  History of syncope: Felt to be in the setting of volume loss with orthostasis.  No further episodes.    Disposition: F/u with Dr. Alvenia Aus or an APP in 12 months.   Medication Adjustments/Labs and Tests Ordered: Current medicines are reviewed at length with the patient today.  Concerns regarding medicines are outlined above. Medication changes, Labs and Tests ordered today are summarized above and listed in the Patient Instructions accessible in Encounters.   Signed, Varney Gentleman, PA-C 03/12/2024 4:40 PM     [facility] [place] [address] [place], [place] [address] [phone]

## 2024-03-12 ENCOUNTER — Ambulatory Visit: Payer: PRIVATE HEALTH INSURANCE | Attending: Physician Assistant | Admitting: Physician Assistant

## 2024-03-12 ENCOUNTER — Encounter: Payer: Self-pay | Admitting: Physician Assistant

## 2024-03-12 VITALS — BP 118/76 | HR 88 | Resp 16 | Ht 62.0 in | Wt 160.0 lb

## 2024-03-12 DIAGNOSIS — Z87898 Personal history of other specified conditions: Secondary | ICD-10-CM

## 2024-03-12 DIAGNOSIS — I4711 Inappropriate sinus tachycardia, so stated: Secondary | ICD-10-CM

## 2024-03-12 MED ORDER — METOPROLOL TARTRATE 25 MG PO TABS: 12.5000 mg | ORAL_TABLET | Freq: Two times a day (BID) | ORAL | 3 refills | Status: DC | PRN

## 2024-03-12 NOTE — Patient Instructions (Signed)
 Medication Instructions:  Your physician recommends the following medication changes.  START TAKING: Lopressor  12.5 mg twice daily as needed for palpitations  *If you need a refill on your cardiac medications before your next appointment, please call your pharmacy*  Lab Work: None ordered at this time   Follow-Up: At [facility], you and your health needs are our priority.  As part of our continuing mission to provide you with exceptional heart care, our providers are all part of one team.  This team includes your primary Cardiologist (physician) and Advanced Practice Providers or APPs (Physician Assistants and Nurse Practitioners) who all work together to provide you with the care you need, when you need it.  Your next appointment:   1 year(s)  Provider:   You may see Antionette Kirks, MD or Varney Gentleman, PA-C

## 2024-03-27 ENCOUNTER — Other Ambulatory Visit: Payer: Self-pay

## 2024-03-27 ENCOUNTER — Ambulatory Visit (INDEPENDENT_AMBULATORY_CARE_PROVIDER_SITE_OTHER): Payer: PRIVATE HEALTH INSURANCE | Admitting: Gastroenterology

## 2024-03-27 ENCOUNTER — Encounter: Payer: Self-pay | Admitting: Gastroenterology

## 2024-03-27 VITALS — BP 114/68 | HR 82 | Ht 62.5 in | Wt 162.4 lb

## 2024-03-27 DIAGNOSIS — K219 Gastro-esophageal reflux disease without esophagitis: Secondary | ICD-10-CM

## 2024-03-27 DIAGNOSIS — Z8719 Personal history of other diseases of the digestive system: Secondary | ICD-10-CM

## 2024-03-27 DIAGNOSIS — Z8619 Personal history of other infectious and parasitic diseases: Secondary | ICD-10-CM

## 2024-03-27 DIAGNOSIS — R110 Nausea: Secondary | ICD-10-CM | POA: Diagnosis not present

## 2024-03-27 DIAGNOSIS — A040 Enteropathogenic Escherichia coli infection: Secondary | ICD-10-CM

## 2024-03-27 DIAGNOSIS — R1013 Epigastric pain: Secondary | ICD-10-CM

## 2024-03-27 MED ORDER — NA SULFATE-K SULFATE-MG SULF 17.5-3.13-1.6 GM/177ML PO SOLN
1.0000 | Freq: Once | ORAL | 0 refills | Status: AC
  Filled 2024-03-27: qty 354, 1d supply, fill #0

## 2024-03-27 NOTE — Progress Notes (Unsigned)
 Hailey Wilkins [id number] July 14, 1971   Chief Complaint: History of diverticulitis  Referring Provider: Brendon Caller* Primary GI MD: Dr. Leonia Raman  HPI: Hailey Wilkins is a 53 y.o. female, new patient, with past medical history of GERD, anemia, asthma, hypertension, migraines, questionable pericarditis 2013, prior cholecystectomy, diverticulitis who presents today to establish care and discuss history of diverticulitis.    Patient seen at [facility] 03/09/2022 for change in bowel habits, abdominal pain, diarrhea, hematochezia.  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  She was hospitalized for this and treated with IV antibiotics (Aztreonam ).  Symptoms initially resolved following hospitalization but then returned on 02/12/22. Labs done at follow-up visit 03/09/2022 included normal CBC, CMP, sed rate, lipase, TSH, negative celiac panel.  A repeat CT A/P was also unremarkable.  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  C. difficile and ova and parasite was negative.   Patient seen by cardiology 03/12/2024 for follow-up of inappropriate sinus tachycardia, symptoms appear to be well-controlled, alcohol is a trigger.  Has history of syncope 2022 felt to be in the setting of volume loss with orthostasis and no further episodes.  She will return for follow-up in 1 year.  Today, patient states she is feeling well.  She denies any episodes of diverticulitis prior to her initial episode in 2023.  Reports that following her hospitalization she did test positive for C. 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  She followed with [facility] clinic for a while, but based on her significant allergies to antibiotics, she felt that she would have some benefit from seeing a holistic medicine clinic.  She has now been following with a holistic doctor for  the last couple years, states she is on some various supplements and it took 1.5 years to "reset her microbiome". Also reports that her PCP Dr. Broadus Canes did prescribe 2 rounds of ivermectin.  She was doing well for about 6 months, and had started to reintroduce foods that she had been avoiding, then in January of this year she had a recurrence of her diverticulitis symptoms which were the same as the symptoms she was having during her first episode in 2023.  She states she also had a UTI at that time, and was treated with an antibiotic in powder form which she does not recall the name of.  This seems to have contributed to the healing of not only her UTI but also her diverticulitis.  She had a CT scan about 2 weeks after the onset of symptoms which did not show any active inflammation.  Over the last couple years her bowel movements have been inconsistent, alternating between diarrhea and constipation, occasionally some normal stools.  Her husband suffered an MI about 3 weeks ago, and since then they have started a heart healthy diet, and patient has been introducing more fruits and vegetables.  For the last 3 weeks she reports her bowel movements have been completely normal, having a formed stool daily, which she attributes to the change in her diet.   She denies diarrhea in the last month, constipation, melena.  She had some hematochezia with episode of diverticulitis and EPEC infection in 2023 but has not had any rectal bleeding since then.  During her episodes of diverticulitis, she first experiences lower abdominal pressure, which seems to travel throughout her abdomen causing generalized abdominal pain, nausea,  and severe LLQ pain with bowel movements.  She will have diarrhea during these episodes.  She remains on a liquid diet for several days.  Has many medication allergies including amoxicillin , ceftriaxone, clindamycin, erythromycin, levofloxacin, tetracyclines.  She reports having an EGD years  ago when she was experiencing reflux, maybe 15 years ago.  She reports frequent belching which she attributes to eating too fast, and takes Tums about once a month for acid reflux. Denies dysphagia.  She experiences nausea about twice a month, for which she takes Zofran  as needed, which is prescribed by her PCP.  She denies family history of colon cancer.  Her father did have pancreatic cancer.  She reports that her grandmother had stomach cancer in her 80s/90s, and 2 aunts had stomach cancer diagnosed in their 90s.  She denies blood thinner use.  Alcohol has caused some tachycardia in the past and she is taking a medication as needed for this when she drinks alcohol, but otherwise is not having any heart or lung problems.  She follows with cardiology annually.  She denies any problems with anesthesia in the past, denies problems during her last colonoscopy.  She will be traveling out of country to [place] in July.  Previous GI Imaging/Procedures   CT A/P w/o contrast 12/20/2023 - Colonic diverticulosis, without radiographic evidence of diverticulitis or other acute findings.  CT A/P w/ contrast 03/09/2022 - No acute abdominopelvic abnormality.  Normal appendix. Sigmoid diverticulosis.  No evidence of acute diverticulitis.  Colonoscopy 07/23/2020 (Dr. Leida Puna) - Pandiverticulosis, one 1 mm polyp removed from the ascending colon with path showing polypoid colonic mucosa with mild edema of lamina propria  Colonoscopy 2006 (Dr. Dawne Euler) - Normal, no polyps per patient  Past Medical History:  Diagnosis Date   Allergic rhinitis    Dr. Cleone Dad, s/p allergy  testing   Anemia    Asthma    Basal cell carcinoma of anterior chest    removed   Breast cyst 2011   bx benign   Complication of anesthesia    Depression    after father died, on [place] in past   Family history of breast cancer    Family history of pancreatic cancer    Family history of stomach cancer    GERD  (gastroesophageal reflux disease)    Gestational diabetes    with first child   Headache(784.0)    History of chicken pox    Hypertension    Inappropriate sinus node tachycardia (HCC)    Internal hemorrhoid 2006   dx during colon w/Dr Dawne Euler   Migraines    associated with menses in past, tx with excedrin, may last up to 1 week   Nonallergic rhinitis    Pericarditis    a. 10/2012 ETT: Ex time 9:00, No ST/T changes; b. 10/2012 Echo: EF 55-60%, no rwma, no pericardial effusion.   PONV (postoperative nausea and vomiting)    [facility] she had no n/v   Shingles 04/2011   Urticaria 11/10/2020    Past Surgical History:  Procedure Laterality Date   ANTERIOR CERVICAL DECOMP/DISCECTOMY FUSION N/A 06/12/2019   Procedure: ANTERIOR CERVICAL DECOMPRESSION/DISCECTOMY FUSION CERVICAL FOUR- CERVICAL FIVE, CERVICAL FIVE- CERVICAL SIX, CERVICAL SIX- CERVICAL SEVEN;  Surgeon: Yvonna Herder, MD;  Location: [facility] OR;  Service: Neurosurgery;  Laterality: N/A;  ANTERIOR CERVICAL DECOMPRESSION/DISCECTOMY FUSION CERVICAL 4- CERVICAL 5, CERVICAL 5- CERVICAL 6, CERVICAL 6- CERVICAL7   BASAL CELL CARCINOMA EXCISION  2011   removed from chest/[facility]  BREAST BIOPSY Right 07/25/2018   stereo bx coil clip- CYSTIC PAPILLARY APOCRINE METAPLASIA   CHOLECYSTECTOMY  2010   [facility]   COLONOSCOPY     KNEE ARTHROSCOPY W/ MENISCAL REPAIR  1989   Right, Dr. Glinda Lapping   NASAL SINUS SURGERY  1999, 2005   REDUCTION MAMMAPLASTY  2017   TONSILLECTOMY  1988    Current Outpatient Medications  Medication Sig Dispense Refill   albuterol  (VENTOLIN  HFA) 108 (90 Base) MCG/ACT inhaler Inhale 2 puffs into the lungs every 6 (six) hours as needed for wheezing or shortness of breath. 18 each 2   cetirizine (ZYRTEC) 10 MG tablet Take 10 mg by mouth daily.     EPINEPHrine  0.3 mg/0.3 mL IJ SOAJ injection Inject 0.3 mg into the muscle as needed for anaphylaxis. 1 each 1   fluticasone   (FLONASE ) 50 MCG/ACT nasal spray Place 2 sprays into both nostrils daily.      metoprolol  tartrate (LOPRESSOR ) 25 MG tablet Take 0.5 tablets (12.5 mg total) by mouth 2 (two) times daily as needed (for palpitations). 180 tablet 3   ondansetron  (ZOFRAN -ODT) 4 MG disintegrating tablet Take 4 mg by mouth every 6 (six) hours as needed.     valACYclovir  (VALTREX ) 1000 MG tablet Take 2 tabs and repeat in 12 hours as needed for cold sores. 30 tablet 1   No current facility-administered medications for this visit.    Allergies as of 03/27/2024 - Review Complete 03/12/2024  Allergen Reaction Noted   Amoxicillin  Anaphylaxis and Hives 02/13/2012   Ceftriaxone Anaphylaxis 02/11/2016   Clindamycin Hives 04/05/2012   Codeine Hives, Nausea And Vomiting, and Rash 02/13/2012   Erythromycin Anaphylaxis and Hives 02/13/2012   Levofloxacin Anaphylaxis 02/13/2012   Lincomycin hcl Hives 02/13/2012   Shellfish allergy  Anaphylaxis 11/16/2012   Tetracyclines & related Anaphylaxis 02/13/2012   Latex  11/16/2012   Sulfa  antibiotics Hives 02/13/2012   Vancomycin Rash 01/23/2017    Family History  Problem Relation Age of Onset   Hypertension Mother    Cancer Mother 48       Breast Ca   Arthritis Mother    Breast cancer Mother 5       2nd primary   Cancer Father 24       Pancreatic   Diabetes Father    Hypertension Father    Hyperlipidemia Father    Heart disease Father    Cancer Sister 68       Thyroid    Cancer Maternal Grandmother        might have had cancer, died at 49   Alcohol abuse Maternal Grandfather    Pancreatic cancer Maternal Grandfather 71       died in his 20's   Alcohol abuse Paternal Grandfather    Heart disease Paternal Grandfather    Hyperlipidemia Paternal Grandfather    Hypertension Paternal Grandfather    Other Paternal Grandmother        brain tumor on pitutary gland   Brain cancer Paternal Grandmother 93       pituitary tumor, died in her 35's   Breast cancer Cousin 66        maternal couisn, is now in her 44's   Stomach cancer Maternal Aunt 28       is now 2   Stomach cancer Maternal Aunt 75       died in 51's   Breast cancer Cousin 65       maternal cousin, is now 57    Social  History   Tobacco Use   Smoking status: Never   Smokeless tobacco: Never  Vaping Use   Vaping status: Never Used  Substance Use Topics   Alcohol use: Yes    Alcohol/week: 5.0 standard drinks of alcohol    Types: 5 Glasses of wine per week    Comment: weekly   Drug use: No     Review of Systems:    Constitutional: No weight loss, fever, chills, weakness or fatigue HEENT: Eyes: No change in vision Ears, Nose, Throat:  No change in hearing or congestion Skin: No rash or itching Cardiovascular: No chest pain, chest pressure Respiratory: No SOB or cough Gastrointestinal: See HPI and otherwise negative Genitourinary: No dysuria or change in urinary frequency Neurological: No headache, dizziness or syncope Musculoskeletal: No new muscle or joint pain Hematologic: No bleeding or bruising    Physical Exam:  Vital signs: BP 114/68   Pulse 82   Ht 5' 2.5" (1.588 m)   Wt 162 lb 6.4 oz (73.7 kg)   LMP 01/19/2018 (Approximate)   SpO2 97%   BMI 29.23 kg/m    Constitutional: NAD, Well developed, Well nourished, alert and cooperative Head:  Normocephalic and atraumatic. Eyes:  EOMs intact. No scleral icterus. Conjunctiva pink. Respiratory: Respirations even and unlabored. Lungs clear to auscultation bilaterally.  No wheezes, crackles, or rhonchi.  Cardiovascular:  Regular rate and rhythm. No peripheral edema, cyanosis or pallor.  Gastrointestinal:  Soft, nondistended, tender to palpation of epigastrium and LUQ. No rebound or guarding. Normal bowel sounds. No appreciable masses or hepatomegaly. Rectal:  Not performed.  Msk:  Symmetrical without gross deformities. Without edema, no deformity or joint abnormality.  Neurologic:  Alert and oriented x4;  grossly normal  neurologically.  Skin:   Dry and intact without significant lesions or rashes. Psychiatric: Oriented to person, place and time. Demonstrates good judgement and reason without abnormal affect or behaviors.   RELEVANT LABS AND IMAGING: CBC    Component Value Date/Time   WBC 7.3 06/01/2021 1700   RBC 4.39 06/01/2021 1700   HGB 13.0 06/01/2021 1700   HGB 13.7 11/10/2020 1307   HGB 12.8 12/09/2014 1412   HCT 38.7 06/01/2021 1700   HCT 42.0 11/10/2020 1307   PLT 290 06/01/2021 1700   PLT 390 11/10/2020 1307   MCV 88.2 06/01/2021 1700   MCV 88 11/10/2020 1307   MCH 29.6 06/01/2021 1700   MCHC 33.6 06/01/2021 1700   RDW 13.7 06/01/2021 1700   RDW 13.1 11/10/2020 1307   LYMPHSABS 2.4 06/01/2021 1700   LYMPHSABS 1.9 11/10/2020 1307   MONOABS 0.6 06/01/2021 1700   EOSABS 0.2 06/01/2021 1700   EOSABS 0.2 11/10/2020 1307   BASOSABS 0.0 06/01/2021 1700   BASOSABS 0.0 11/10/2020 1307    CMP     Component Value Date/Time   NA 139 06/01/2021 1700   NA 142 11/10/2020 1307   NA 143 11/09/2012 1703   K 3.9 06/01/2021 1700   K 4.0 11/09/2012 1703   CL 105 06/01/2021 1700   CL 106 11/09/2012 1703   CO2 29 06/01/2021 1700   CO2 27 11/09/2012 1703   GLUCOSE 98 06/01/2021 1700   GLUCOSE 106 (H) 11/09/2012 1703   BUN 13 06/01/2021 1700   BUN 20 11/10/2020 1307   BUN 13 11/09/2012 1703   CREATININE 0.83 06/01/2021 1700   CREATININE 0.86 12/09/2014 1509   CALCIUM 9.3 06/01/2021 1700   CALCIUM 9.0 11/09/2012 1703   PROT 7.6 06/01/2021 1700  PROT 7.7 11/10/2020 1307   ALBUMIN 4.3 06/01/2021 1700   ALBUMIN 4.7 11/10/2020 1307   AST 22 06/01/2021 1700   ALT 23 06/01/2021 1700   ALKPHOS 69 06/01/2021 1700   BILITOT 0.5 06/01/2021 1700   BILITOT <0.2 11/10/2020 1307   GFRNONAA >60 06/01/2021 1700   GFRNONAA >60 11/09/2012 1703   GFRAA 101 11/10/2020 1307   GFRAA >60 11/09/2012 1703     Assessment/Plan:   History of diverticulitis History of enteropathogenic E. coli  infection History of C. difficile infection ( per pt, following hospitalization 2023) Patient with history of diverticulitis April 2023, with recurrence January of this year.  She has not had a colonoscopy since 2021.  Currently asymptomatic with normal bowel movements. - Plan for colonoscopy in [facility] to rule out underlying malignancy. I thoroughly discussed the procedure with the patient to include nature of the procedure, alternatives, benefits, and risks (including but not limited to bleeding, infection, perforation, anesthesia/cardiac/pulmonary complications). Patient verbalized understanding and gave verbal consent to proceed with procedure.  - Following colonoscopy, can send surgical referral to discuss elective sigmoid resection given 2+ episodes of diverticulitis. - Avoid probiotic supplements - Aim for 20-30g fiber daily, in divided doses.  GERD Intermittent nausea Epigastric pain on exam - Add on EGD to upcoming colonoscopy to evaluate GERD symptoms and epigastric pain. - May start PPI based on findings.   Follow up 6 months with APP   Valiant Gaul, PA-C [facility] 03/27/2024, 8:22 AM  Patient Care Team: Judge Notice, MD as PCP - General (Internal Medicine) Wenona Hamilton, MD as PCP - Cardiology (Cardiology) Lillian Rein, MD as Attending Physician (Gynecology)     Attending physician's note  I personally saw the patient and performed a substantive portion of this encounter (>50% time spent), including a complete performance of at least one of the key components (MDM, Hx and/or Exam), in conjunction with the APP.  I agree with the APP's note, impression, and recommendations with additional input as follows.     Pleasant 53 year old female with history of recurrent diverticulitis, will plan to proceed with colonoscopy to exclude neoplastic lesion.  Complaints of epigastric discomfort, tenderness on exam, plan to proceed with EGD to exclude  gastroduodenal ulcer or erosive gastritis Discussed possible segmental sigmoidectomy to prevent recurrent diverticulitis, will send referral to colorectal surgery after colonoscopy if patient is agreeable   The patient was provided an opportunity to ask questions and all were answered. The patient agreed with the plan and demonstrated an understanding of the instructions.  Lorena Rolling , MD [phone]

## 2024-03-27 NOTE — Patient Instructions (Signed)
 You have been scheduled for an endoscopy and colonoscopy. Please follow the written instructions given to you at your visit today.  If you use inhalers (even only as needed), please bring them with you on the day of your procedure.  DO NOT TAKE 7 DAYS PRIOR TO TEST- Trulicity (dulaglutide) Ozempic, Wegovy (semaglutide) Mounjaro (tirzepatide) Bydureon Bcise (exanatide extended release)  DO NOT TAKE 1 DAY PRIOR TO YOUR TEST Rybelsus (semaglutide) Adlyxin (lixisenatide) Victoza (liraglutide) Byetta (exanatide) ___________________________________________________________________________   Due to recent changes in healthcare laws, you may see the results of your imaging and laboratory studies on MyChart before your provider has had a chance to review them.  We understand that in some cases there may be results that are confusing or concerning to you. Not all laboratory results come back in the same time frame and the provider may be waiting for multiple results in order to interpret others.  Please give us  48 hours in order for your provider to thoroughly review all the results before contacting the office for clarification of your results.    I appreciate the  opportunity to care for you  Thank You   Kavitha Nandigam , MD

## 2024-03-28 ENCOUNTER — Encounter: Payer: Self-pay | Admitting: Gastroenterology

## 2024-04-22 ENCOUNTER — Encounter: Payer: Self-pay | Admitting: Gastroenterology

## 2024-04-23 ENCOUNTER — Telehealth: Payer: Self-pay

## 2024-04-23 NOTE — Telephone Encounter (Signed)
 I can't attach or copy and paste my notes so I will try and summarize.  The pain started Monday the 26th. It got very intense on Thursday through Saturday.   Monday through Thursday my poops were like shoestrings. I went frequently but I wasn't passing a lot. I thought I might be very constipated so I took some Metamucil to try and bulk up my stools.  I think that may have been a mistake. My worst days were Thursday-Saturday. I was in slot of pain and by this time, I was very bloated/swollen. The most that I have eaten since Friday has been a baked potato, some saltine crackers, and a spoonful of chicken pie other times it's just been a completely liquid diet. I had two big episodes of diarrhea on Sunday and one episode of diarrhea yesterday.  Areil Reynolds Popescu to [facility] (supporting Sergio Dandy, MD)     04/23/24  8:51 AM Good morning Beth, I am much better than I was. I still have a "soreness" on my left side and am nauseous. I have not had diarrhea since yesterday afternoon about 4 o'clock.  I did eat a spoonful of chicken pie last night and it has stayed with me. I'm going to try and attach what I call my "poop notes" and if that doesn't work I will send another message with copy and paste.  Me to Danne Scardina Pagliaro     04/23/24  8:33 AM Hello Ms Crisanto Your doctor is off this week.  How are you today. Do you still have pain? Are you worse? If you can give me a run down of the present symptoms, I can ask Dr Allean Aran PA what needs to be done now. Thanks Beth  Last read by Jayne Mews Peeples at 8:54AM on 04/23/2024. Geraldean Reynolds Bruins to [facility] (supporting Sergio Dandy, MD)     04/22/24  5:48 PM Dr. Leonia Raman, I thought you may want to know before my procedure on the 25th that I have had another episode of what I think is diverticulitis.  It felt like it has before. I took good notes about how I was feeling, what I had been eating, and what my  stools were like.  I will be happy to send them if you would like to read them before my procedures. It started Monday the 26th and is still continuing today.  I have been managing with Tylenol , zofran  and liquid diet (since Friday) the pain is not as severe at it was.  I'm not sure what I did to set it off. I have been eating a lot of gelato before this episode so maybe that was it. Please let me know if you would like for me to send my notes or wait until I see you on the 25th for my procedures. Thanks, Myla Artist

## 2024-05-08 ENCOUNTER — Telehealth: Payer: Self-pay | Admitting: Gastroenterology

## 2024-05-08 NOTE — Telephone Encounter (Signed)
 ok

## 2024-05-08 NOTE — Telephone Encounter (Signed)
 Good afternoon Dr. Leonia Raman, this patient called and stated that she was diagnosed with Pneumonia and is needing to reschedule her procedure. Patient appointment was originally for June the 25 th at 1:30 PM. Patient was rescheduled for July 30 th at 2:30 PM.

## 2024-05-15 ENCOUNTER — Encounter: Payer: PRIVATE HEALTH INSURANCE | Admitting: Gastroenterology

## 2024-06-12 ENCOUNTER — Other Ambulatory Visit: Payer: Self-pay | Admitting: Medical Genetics

## 2024-06-17 ENCOUNTER — Other Ambulatory Visit
Admission: RE | Admit: 2024-06-17 | Discharge: 2024-06-17 | Disposition: A | Discharge status: Home / Self Care | Payer: PRIVATE HEALTH INSURANCE | Source: Ambulatory Visit | Attending: Medical Genetics | Admitting: Medical Genetics

## 2024-06-19 ENCOUNTER — Encounter: Payer: Self-pay | Admitting: Gastroenterology

## 2024-06-19 ENCOUNTER — Ambulatory Visit (AMBULATORY_SURGERY_CENTER): Payer: PRIVATE HEALTH INSURANCE | Admitting: Gastroenterology

## 2024-06-19 VITALS — BP 120/67 | HR 82 | Temp 97.6°F | Resp 16 | Ht 62.5 in | Wt 162.0 lb

## 2024-06-19 DIAGNOSIS — K219 Gastro-esophageal reflux disease without esophagitis: Secondary | ICD-10-CM

## 2024-06-19 DIAGNOSIS — K5730 Diverticulosis of large intestine without perforation or abscess without bleeding: Secondary | ICD-10-CM

## 2024-06-19 DIAGNOSIS — K644 Residual hemorrhoidal skin tags: Secondary | ICD-10-CM

## 2024-06-19 DIAGNOSIS — K449 Diaphragmatic hernia without obstruction or gangrene: Secondary | ICD-10-CM | POA: Diagnosis present

## 2024-06-19 DIAGNOSIS — K648 Other hemorrhoids: Secondary | ICD-10-CM

## 2024-06-19 DIAGNOSIS — G8929 Other chronic pain: Secondary | ICD-10-CM

## 2024-06-19 DIAGNOSIS — Z8719 Personal history of other diseases of the digestive system: Secondary | ICD-10-CM

## 2024-06-19 MED ORDER — SODIUM CHLORIDE 0.9 % IV SOLN: 500.0000 mL | Freq: Once | INTRAVENOUS | Status: DC

## 2024-06-19 NOTE — Progress Notes (Signed)
 Vss nad trans to pacu

## 2024-06-19 NOTE — Progress Notes (Signed)
  Gastroenterology History and Physical   Primary Care Physician:  Trudy Dorn Lunger, MD   Reason for Procedure:  GERD, epigastric pain, H/o diverticulitis  Plan:    EGD and colonoscopy with possible interventions as needed     HPI: Hailey Wilkins is a very pleasant 53 y.o. female here for EGD and colonoscopy for epigastric pain, h/o colon diverticulitis.   The risks and benefits as well as alternatives of endoscopic procedure(s) have been discussed and reviewed. All questions answered. The patient agrees to proceed.    Past Medical History:  Diagnosis Date   Allergic rhinitis    Dr. Ector, s/p allergy  testing   Anemia    Asthma    Basal cell carcinoma of anterior chest    removed   Breast cyst 2011   bx benign   Complication of anesthesia    Depression    after father died, on [place] in past   Family history of breast cancer    Family history of pancreatic cancer    Family history of stomach cancer    GERD (gastroesophageal reflux disease)    Gestational diabetes    with first child   Headache(784.0)    History of chicken pox    Hypertension    Inappropriate sinus node tachycardia (HCC)    Internal hemorrhoid 2006   dx during colon w/Dr Geryl   Migraines    associated with menses in past, tx with excedrin, may last up to 1 week   Nonallergic rhinitis    Pericarditis    a. 10/2012 ETT: Ex time 9:00, No ST/T changes; b. 10/2012 Echo: EF 55-60%, no rwma, no pericardial effusion.   PONV (postoperative nausea and vomiting)    [facility] had no n/v   Shingles 04/2011   Urticaria 11/10/2020    Past Surgical History:  Procedure Laterality Date   ANTERIOR CERVICAL DECOMP/DISCECTOMY FUSION N/A 06/12/2019   Procedure: ANTERIOR CERVICAL DECOMPRESSION/DISCECTOMY FUSION CERVICAL FOUR- CERVICAL FIVE, CERVICAL FIVE- CERVICAL SIX, CERVICAL SIX- CERVICAL SEVEN;  Surgeon: Alix Charleston, MD;  Location: [facility] OR;  Service:  Neurosurgery;  Laterality: N/A;  ANTERIOR CERVICAL DECOMPRESSION/DISCECTOMY FUSION CERVICAL 4- CERVICAL 5, CERVICAL 5- CERVICAL 6, CERVICAL 6- CERVICAL7   BASAL CELL CARCINOMA EXCISION  2011   removed from chest/[facility]   BREAST BIOPSY Right 07/25/2018   stereo bx coil clip- CYSTIC PAPILLARY APOCRINE METAPLASIA   CHOLECYSTECTOMY  2010   [facility]   COLONOSCOPY     KNEE ARTHROSCOPY W/ MENISCAL REPAIR  1989   Right, Dr. Kathi   NASAL SINUS SURGERY  1999, 2005   REDUCTION MAMMAPLASTY  2017   TONSILLECTOMY  1988    Prior to Admission medications   Medication Sig Start Date End Date Taking? Authorizing Provider  albuterol  (VENTOLIN  HFA) 108 (90 Base) MCG/ACT inhaler Inhale 2 puffs into the lungs every 6 (six) hours as needed for wheezing or shortness of breath. 06/09/21   Ambs, Arlean HERO, FNP  cetirizine (ZYRTEC) 10 MG tablet Take 10 mg by mouth daily.    [provider]  EPINEPHrine  0.3 mg/0.3 mL IJ SOAJ injection Inject 0.3 mg into the muscle as needed for anaphylaxis. 10/08/20   Kennyth Domino, FNP  fluticasone  (FLONASE ) 50 MCG/ACT nasal spray Place 2 sprays into both nostrils daily.     [provider]  LYSINE PO Take by mouth.    [provider]  metoprolol  tartrate (LOPRESSOR ) 25 MG tablet Take 0.5 tablets (12.5  mg total) by mouth 2 (two) times daily as needed (for palpitations). 03/12/24 06/10/24  Abigail Bernardino HERO, PA-C  Multiple Vitamins-Minerals (COMPLETE MULTIVITAMIN/MINERAL PO) Take by mouth.    [provider]  ondansetron  (ZOFRAN -ODT) 4 MG disintegrating tablet Take 4 mg by mouth every 6 (six) hours as needed. Patient not taking: Reported on 06/19/2024 01/02/24   [provider]  UNABLE TO FIND Med Name: Ion Gut Support    [provider]  UNABLE TO FIND Med Name: Women probiotic    [provider]  valACYclovir  (VALTREX ) 1000 MG tablet Take 2 tabs and repeat in 12 hours as needed for cold sores.  07/18/23   Cleotilde Ronal RAMAN, MD    Current Outpatient Medications  Medication Sig Dispense Refill   albuterol  (VENTOLIN  HFA) 108 (90 Base) MCG/ACT inhaler Inhale 2 puffs into the lungs every 6 (six) hours as needed for wheezing or shortness of breath. 18 each 2   cetirizine (ZYRTEC) 10 MG tablet Take 10 mg by mouth daily.     EPINEPHrine  0.3 mg/0.3 mL IJ SOAJ injection Inject 0.3 mg into the muscle as needed for anaphylaxis. 1 each 1   fluticasone  (FLONASE ) 50 MCG/ACT nasal spray Place 2 sprays into both nostrils daily.      LYSINE PO Take by mouth.     metoprolol  tartrate (LOPRESSOR ) 25 MG tablet Take 0.5 tablets (12.5 mg total) by mouth 2 (two) times daily as needed (for palpitations). 180 tablet 3   Multiple Vitamins-Minerals (COMPLETE MULTIVITAMIN/MINERAL PO) Take by mouth.     ondansetron  (ZOFRAN -ODT) 4 MG disintegrating tablet Take 4 mg by mouth every 6 (six) hours as needed. (Patient not taking: Reported on 06/19/2024)     UNABLE TO FIND Med Name: Ion Gut Support     UNABLE TO FIND Med Name: Women probiotic     valACYclovir  (VALTREX ) 1000 MG tablet Take 2 tabs and repeat in 12 hours as needed for cold sores. 30 tablet 1   Current Facility-Administered Medications  Medication Dose Route Frequency Provider Last Rate Last Admin   0.9 %  sodium chloride  infusion  500 mL Intravenous Once Khyson Sebesta V, MD        Allergies as of 06/19/2024 - Review Complete 06/19/2024  Allergen Reaction Noted   Amoxicillin  Anaphylaxis and Hives 02/13/2012   Ceftriaxone Anaphylaxis 02/11/2016   Clindamycin Hives 04/05/2012   Codeine Hives, Nausea And Vomiting, and Rash 02/13/2012   Erythromycin Anaphylaxis and Hives 02/13/2012   Levofloxacin Anaphylaxis 02/13/2012   Lincomycin hcl Hives 02/13/2012   Shellfish allergy  Anaphylaxis 11/16/2012   Tetracyclines & related Anaphylaxis 02/13/2012   Latex Rash 11/16/2012   Sulfa  antibiotics Hives 02/13/2012   Vancomycin Rash 01/23/2017    Family History   Problem Relation Age of Onset   Hypertension Mother    Cancer Mother 24       Breast Ca   Arthritis Mother    Breast cancer Mother 51       2nd primary   Cancer Father 25       Pancreatic   Diabetes Father    Hypertension Father    Hyperlipidemia Father    Heart disease Father    Cancer Sister 21       Thyroid    Cancer Maternal Grandmother        might have had cancer, died at 25   Alcohol abuse Maternal Grandfather    Pancreatic cancer Maternal Grandfather 67       died in his 67's  Other Paternal Grandmother        brain tumor on pitutary gland   Brain cancer Paternal Grandmother 45       pituitary tumor, died in her 18's   Alcohol abuse Paternal Grandfather    Heart disease Paternal Grandfather    Hyperlipidemia Paternal Grandfather    Hypertension Paternal Grandfather    Stomach cancer Maternal Aunt 65       is now 64   Stomach cancer Maternal Aunt 75       died in 16's   Breast cancer Cousin 65       maternal couisn, is now in her 89's   Breast cancer Cousin 29       maternal cousin, is now 93   Colon cancer Neg Hx    Esophageal cancer Neg Hx     Social History   Socioeconomic History   Marital status: Married    Spouse name: Not on file   Number of children: 2   Years of education: Not on file   Highest education level: Not on file  Occupational History   Occupation: Payroll & Benefits admin - [employer]     Employer: [employer]  Tobacco Use   Smoking status: Never   Smokeless tobacco: Never  Vaping Use   Vaping status: Never Used  Substance and Sexual Activity   Alcohol use: Yes    Alcohol/week: 5.0 standard drinks of alcohol    Types: 5 Glasses of wine per week    Comment: weekly   Drug use: No   Sexual activity: Yes    Partners: Male    Birth control/protection: Post-menopausal  Other Topics Concern   Not on file  Social History Narrative   Lives in [place] with husband and 19YO and 11YO. Works at [employer]. Dog and Cat.       Regular Exercise -  GYM, wts, walking, elliptical - 3 times a week x 1 hour   Daily Caffeine Use:  3 cups coffee, 1 diet soda            Social Drivers of Corporate investment banker Strain: Low Risk  (02/04/2022)   Received from [facility]   Overall Financial Resource Strain (CARDIA)    Difficulty of Paying Living Expenses: Not hard at all  Food Insecurity: No Food Insecurity (02/04/2022)   Received from [facility]   Hunger Vital Sign    Within the past 12 months, you worried that your food would run out before you got the money to buy more.: Never true    Within the past 12 months, the food you bought just didn't last and you didn't have money to get more.: Never true  Transportation Needs: No Transportation Needs (02/04/2022)   Received from [facility]   PRAPARE - Transportation    Lack of Transportation (Medical): No    Lack of Transportation (Non-Medical): No  Physical Activity: Not on file  Stress: Not on file  Social Connections: Not on file  Intimate Partner Violence: Not on file    Review of Systems:  All other review of systems negative except as mentioned in the HPI.  Physical Exam: Vital signs in last 24 hours: BP 110/77   Pulse 86   Temp 97.6 F (36.4 C) (Skin)   Ht 5' 2.5 (1.588 m)   Wt 162 lb (73.5 kg)   LMP 01/19/2018 (Approximate)   SpO2 98%   BMI 29.16 kg/m  General:  Alert, NAD Lungs:  Clear .   Heart:  Regular rate and rhythm Abdomen:  Soft, nontender and nondistended. Neuro/Psych:  Alert and cooperative. Normal mood and affect. A and O x 3  Reviewed labs, radiology imaging, old records and pertinent past GI work up  Patient is appropriate for planned procedure(s) and anesthesia in an ambulatory setting   K. Veena Aylin Rhoads , MD [phone]

## 2024-06-19 NOTE — Progress Notes (Signed)
 Pt's states no medical or surgical changes since previsit or office visit.

## 2024-06-19 NOTE — Patient Instructions (Signed)
 Resume previous diet Follow an antireflux regimen Continue present medications  Repeat colonoscopy in 10 years See handout for diverticulosis   YOU HAD AN ENDOSCOPIC PROCEDURE TODAY AT THE [facility]:   Refer to the procedure report that was given to you for any specific questions about what was found during the examination.  If the procedure report does not answer your questions, please call your gastroenterologist to clarify.  If you requested that your care partner not be given the details of your procedure findings, then the procedure report has been included in a sealed envelope for you to review at your convenience later.  YOU SHOULD EXPECT: Some feelings of bloating in the abdomen. Passage of more gas than usual.  Walking can help get rid of the air that was put into your GI tract during the procedure and reduce the bloating. If you had a lower endoscopy (such as a colonoscopy or flexible sigmoidoscopy) you may notice spotting of blood in your stool or on the toilet paper. If you underwent a bowel prep for your procedure, you may not have a normal bowel movement for a few days.  Please Note:  You might notice some irritation and congestion in your nose or some drainage.  This is from the oxygen used during your procedure.  There is no need for concern and it should clear up in a day or so.  SYMPTOMS TO REPORT IMMEDIATELY:  Following lower endoscopy (colonoscopy or flexible sigmoidoscopy):  Excessive amounts of blood in the stool  Significant tenderness or worsening of abdominal pains  Swelling of the abdomen that is new, acute  Fever of 100F or higher  Following upper endoscopy (EGD)  Vomiting of blood or coffee ground material  New chest pain or pain under the shoulder blades  Painful or persistently difficult swallowing  New shortness of breath  Black, tarry-looking stools  For urgent or emergent issues, a gastroenterologist can be reached at any hour by calling  [phone]. Do not use MyChart messaging for urgent concerns.   DIET:  We do recommend a small meal at first, but then you may proceed to your regular diet.  Drink plenty of fluids but you should avoid alcoholic beverages for 24 hours.  ACTIVITY:  You should plan to take it easy for the rest of today and you should NOT DRIVE or use heavy machinery until tomorrow (because of the sedation medicines used during the test).    FOLLOW UP: Our staff will call the number listed on your records the next business day following your procedure.  We will call around 7:15- 8:00 am to check on you and address any questions or concerns that you may have regarding the information given to you following your procedure. If we do not reach you, we will leave a message.     If any biopsies were taken you will be contacted by phone or by letter within the next 1-3 weeks.  Please call us  at [phone] if you have not heard about the biopsies in 3 weeks.   SIGNATURES/CONFIDENTIALITY: You and/or your care partner have signed paperwork which will be entered into your electronic medical record.  These signatures attest to the fact that that the information above on your After Visit Summary has been reviewed and is understood.  Full responsibility of the confidentiality of this discharge information lies with you and/or your care-partner.

## 2024-06-19 NOTE — Op Note (Signed)
 [facility] Patient Name: Hailey Wilkins Procedure Date: 06/19/2024 3:25 PM MRN: [id number] Endoscopist: Gustav ALONSO Mcgee , MD, [id number] Age: 53 Referring MD:  Date of Birth: 01/23/1971 Gender: Female Account #: [id number] Procedure:                Upper GI endoscopy Indications:              Epigastric abdominal pain Medicines:                Monitored Anesthesia Care Procedure:                Pre-Anesthesia Assessment:                           - Prior to the procedure, a History and Physical                            was performed, and patient medications and                            allergies were reviewed. The patient's tolerance of                            previous anesthesia was also reviewed. The risks                            and benefits of the procedure and the sedation                            options and risks were discussed with the patient.                            All questions were answered, and informed consent                            was obtained. Prior Anticoagulants: The patient has                            taken no anticoagulant or antiplatelet agents. ASA                            Grade Assessment: II - A patient with mild systemic                            disease. After reviewing the risks and benefits,                            the patient was deemed in satisfactory condition to                            undergo the procedure.                           After obtaining informed consent, the endoscope was  passed under direct vision. Throughout the                            procedure, the patient's blood pressure, pulse, and                            oxygen saturations were monitored continuously. The                            GIF [id number] was introduced through the                            mouth, and advanced to the second part of duodenum.                            The upper GI endoscopy  was accomplished without                            difficulty. The patient tolerated the procedure                            well. Scope In: Scope Out: Findings:                 The Z-line was regular and was found 36 cm from the                            incisors.                           No gross lesions were noted in the entire esophagus.                           A 3 cm hiatal hernia was present.                           The stomach was normal.                           The cardia and gastric fundus were normal on                            retroflexion.                           The examined duodenum was normal. Complications:            No immediate complications. Estimated Blood Loss:     Estimated blood loss was minimal. Impression:               - Z-line regular, 36 cm from the incisors.                           - No gross lesions in the entire esophagus.                           -  3 cm hiatal hernia.                           - Normal stomach.                           - Normal examined duodenum.                           - No specimens collected. Recommendation:           - Patient has a contact number available for                            emergencies. The signs and symptoms of potential                            delayed complications were discussed with the                            patient. Return to normal activities tomorrow.                            Written discharge instructions were provided to the                            patient.                           - Resume previous diet.                           - Continue present medications.                           - Follow an antireflux regimen. Delane Wessinger V. Dominigue Gellner, MD 06/19/2024 4:00:55 PM This report has been signed electronically.

## 2024-06-19 NOTE — Op Note (Signed)
 [facility] Patient Name: Gavin Faivre Procedure Date: 06/19/2024 3:25 PM MRN: [id number] Endoscopist: Gustav ALONSO Mcgee , MD, [id number] Age: 54 Referring MD:  Date of Birth: 01/26/1971 Gender: Female Account #: [id number] Procedure:                Colonoscopy Indications:              Follow-up of diverticulitis Medicines:                Monitored Anesthesia Care Procedure:                Pre-Anesthesia Assessment:                           - Prior to the procedure, a History and Physical                            was performed, and patient medications and                            allergies were reviewed. The patient's tolerance of                            previous anesthesia was also reviewed. The risks                            and benefits of the procedure and the sedation                            options and risks were discussed with the patient.                            All questions were answered, and informed consent                            was obtained. Prior Anticoagulants: The patient has                            taken no anticoagulant or antiplatelet agents. ASA                            Grade Assessment: II - A patient with mild systemic                            disease. After reviewing the risks and benefits,                            the patient was deemed in satisfactory condition to                            undergo the procedure.                           After obtaining informed consent, the colonoscope  was passed under direct vision. Throughout the                            procedure, the patient's blood pressure, pulse, and                            oxygen saturations were monitored continuously. The                            [id number] Colonoscope [id number] was introduced                            through the anus and advanced to the the cecum,                            identified by appendiceal  orifice and ileocecal                            valve. The colonoscopy was performed without                            difficulty. The patient tolerated the procedure                            well. The quality of the bowel preparation was                            good. The ileocecal valve, appendiceal orifice, and                            rectum were photographed. Scope In: 3:37:16 PM Scope Out: 3:49:52 PM Scope Withdrawal Time: 0 hours 8 minutes 4 seconds  Total Procedure Duration: 0 hours 12 minutes 36 seconds  Findings:                 The perianal and digital rectal examinations were                            normal.                           Scattered small-mouthed diverticula were found in                            the sigmoid colon, descending colon, transverse                            colon and ascending colon. There was no evidence of                            diverticular bleeding.                           Non-bleeding external and internal hemorrhoids were  found during retroflexion. The hemorrhoids were                            medium-sized. Complications:            No immediate complications. Estimated Blood Loss:     Estimated blood loss was minimal. Impression:               - Moderate diverticulosis in the sigmoid colon, in                            the descending colon, in the transverse colon and                            in the ascending colon. There was no evidence of                            diverticular bleeding.                           - Non-bleeding external and internal hemorrhoids.                           - No specimens collected. Recommendation:           - Patient has a contact number available for                            emergencies. The signs and symptoms of potential                            delayed complications were discussed with the                            patient. Return to normal activities  tomorrow.                            Written discharge instructions were provided to the                            patient.                           - Resume previous diet.                           - Continue present medications.                           - Repeat colonoscopy in 10 years for surveillance. Demarri Elie V. Jaelah Hauth, MD 06/19/2024 3:57:57 PM This report has been signed electronically.

## 2024-06-20 ENCOUNTER — Telehealth: Payer: Self-pay

## 2024-06-20 NOTE — Telephone Encounter (Signed)
  Follow up Call-     06/19/2024    2:39 PM  Call back number  Post procedure Call Back phone  # [phone]  Permission to leave phone message Yes     Patient questions:  Do you have a fever, pain , or abdominal swelling? No. Pain Score  0 *  Have you tolerated food without any problems? Yes.    Have you been able to return to your normal activities? Yes.    Do you have any questions about your discharge instructions: Diet   No. Medications  No. Follow up visit  No.  Do you have questions or concerns about your Care? No.  Actions: * If pain score is 4 or above: No action needed, pain <4.

## 2024-07-09 LAB — GENECONNECT MOLECULAR SCREEN: Genetic Analysis Overall Interpretation: POSITIVE — AB

## 2024-07-10 ENCOUNTER — Telehealth: Payer: Self-pay | Admitting: Medical Genetics

## 2024-07-11 ENCOUNTER — Telehealth: Payer: Self-pay | Admitting: Medical Genetics

## 2024-07-11 NOTE — Telephone Encounter (Signed)
 [place] [facility] 07/11/2024 @ 1:09 PM  Confirmed I was speaking with Hailey Wilkins [id number] by using name and DOB. Genetic counseling was offered and participant is scheduled. All questions were answered, and participant was thanked for their time and support of the above study. Participant was encouraged to contact [facility] if they have any further questions or concerns.    Hailey Wilkins, BS [place]  [facility] Clinical Research Specialist II Direct Dial: [phone]  Fax: [phone]

## 2024-07-11 NOTE — Telephone Encounter (Signed)
 Created in error

## 2024-07-11 NOTE — Telephone Encounter (Signed)
 [place] [facility] Positive Result Note 07/11/2024 11:33 AM  SECOND ATTEMPT: Confirmed I was speaking with Hailey Wilkins [id number] by using name and DOB. Informed participant the reason for this call is to provide results for the above study. Results revealed Hereditary High Cholesterol or Familial Hypercholesterolemia. Genetic counseling was offered and participant requests a call back to schedule. All questions were answered, and participant was thanked for their time and support of the above study. Participant was encouraged to contact [facility] if they have any further questions or concerns.

## 2024-07-30 ENCOUNTER — Ambulatory Visit: Admitting: Genetic Counselor

## 2024-07-30 ENCOUNTER — Encounter: Payer: Self-pay | Admitting: Genetic Counselor

## 2024-07-30 DIAGNOSIS — Z148 Genetic carrier of other disease: Secondary | ICD-10-CM

## 2024-07-30 NOTE — Progress Notes (Signed)
 PRIMARY PROVIDER:  Trudy Dorn Lunger, MD  PRIMARY REASON FOR VISIT:  1. 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 female, was seen for a [place] genetic counseling as follow up to her participation in the [place] Study, part of the [facility] Program. 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   RELEVANT MEDICAL HISTORY:  Hailey Wilkins does not report a history of significantly elevated cholesterol. LDL-C in 2024 107 mg/dL. She reports recent improvements with cholesterol since making some lifestyle changes following her spouse's heart attack in April. Hailey Wilkins does not report a personal history of extremely elevated cholesterol, premature cardiovascular disease or history of xanthoma.   Hailey Wilkins had genetic testing with [facility]'s Common Hereditary Cancer Panel in 2018. The Hereditary Gene Panel offered by [facility] includes sequencing and/or deletion duplication testing of the following 46 genes: APC, ATM, AXIN2, BARD1, BMPR1A, BRCA1, BRCA2, BRIP1, CDH1, CDKN2A (p14ARF), CDKN2A (p16INK4a), CHEK2, CTNNA1, DICER1, EPCAM (Deletion/duplication testing only), GREM1 (promoter region deletion/duplication testing only), KIT, MEN1, MLH1, MSH2, MSH3, MSH6, MUTYH, NBN, NF1, NHTL1, PALB2, PDGFRA, PMS2, POLD1, POLE, PTEN, RAD50, RAD51C, RAD51D, SDHB, SDHC, SDHD, SMAD4, SMARCA4. STK11, TP53, TSC1, TSC2, and VHL. The following genes were evaluated for sequence changes only: SDHA and HOXB13 c.251G>A variant only. No pathogenic variants were identified.   She reports menarche at age 19, age at first birth 7, menopause at age 60. No history of HRT. Mammogram completed 11/2023, density b. Colonoscopy 2025, no polyps.   Past Medical History:  Diagnosis Date   Allergic rhinitis    Dr. Ector, s/p allergy  testing    Anemia    Asthma    Basal cell carcinoma of anterior chest    removed   Breast cyst 2011   bx benign   Complication of anesthesia    Depression    after father died, on P[place] in past   Family history of breast cancer    Family history of pancreatic cancer    Family history of stomach cancer    GERD (gastroesophageal reflux disease)    Gestational diabetes    with first child   Headache(784.0)    History of chicken pox    Hypertension    Inappropriate sinus node tachycardia (HCC)    Internal hemorrhoid 2006   dx during colon w/Dr Geryl   Migraines    associated with menses in past, tx with excedrin, may last up to 1 week   Nonallergic rhinitis    Pericarditis    a. 10/2012 ETT: Ex time 9:00, No ST/T changes; b. 10/2012 Echo: EF 55-60%, no rwma, no pericardial effusion.   PONV (postoperative nausea and vomiting)    [facility] she had no n/v   Shingles 04/2011   Urticaria 11/10/2020    Past Surgical History:  Procedure Laterality Date   ANTERIOR CERVICAL DECOMP/DISCECTOMY FUSION N/A 06/12/2019   Procedure: ANTERIOR CERVICAL DECOMPRESSION/DISCECTOMY FUSION CERVICAL FOUR- CERVICAL FIVE, CERVICAL FIVE- CERVICAL SIX, CERVICAL SIX- CERVICAL SEVEN;  Surgeon: Alix Charleston, MD;  Location: [facility] OR;  Service: Neurosurgery;  Laterality: N/A;  ANTERIOR CERVICAL DECOMPRESSION/DISCECTOMY FUSION CERVICAL 4- CERVICAL 5, CERVICAL 5- CERVICAL 6, CERVICAL 6- CERVICAL7   BASAL CELL CARCINOMA EXCISION  2011   removed from chest/[facility]   BREAST BIOPSY Right 07/25/2018   stereo  bx coil clip- CYSTIC PAPILLARY APOCRINE METAPLASIA   CHOLECYSTECTOMY  2010   [facility]   COLONOSCOPY     KNEE ARTHROSCOPY W/ MENISCAL REPAIR  1989   Right, Dr. 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 Works at [employer]. Dog and Cat.      Regular Exercise -  GYM, wts, walking, elliptical - 3 times a week x 1 hour   Daily Caffeine Use:  3 cups coffee, 1 diet soda            Social Drivers of Corporate investment banker Strain: Low Risk  (02/04/2022)   Received from [facility]   Overall Financial Resource Strain (CARDIA)    Difficulty of Paying Living Expenses: Not hard at all  Food Insecurity: No Food Insecurity (02/04/2022)   Received from [facility]   Hunger Vital Sign    Within the past 12 months, you worried that your food would run out before you got the money to buy more.: Never true    Within the past 12 months, the food you bought just didn't last and you didn't have money to get more.: Never true  Transportation Needs: No Transportation Needs (02/04/2022)   Received from [facility]   PRAPARE - Transportation    Lack of Transportation (Medical): No    Lack of Transportation (Non-Medical): No  Physical Activity: Not on file  Stress: Not on file  Social Connections: Not on file     FAMILY HISTORY:  We obtained a detailed, 3-generation family history.  Significant diagnoses are listed below: Family History  Problem Relation Age of Onset   Hypertension Mother    Arthritis  Mother    Breast cancer Mother 2       2nd primary at 63   Diabetes Father    Hypertension Father    Hyperlipidemia Father    Heart disease Father    Pancreatic cancer Father 37   Heart attack Father 4       HPB, stents   Thyroid  cancer Sister 75   Cancer Maternal Grandmother        might have had cancer, died at 29   Alcohol abuse Maternal Grandfather    Pancreatic cancer Maternal Grandfather 83       died in his 67's   Other Paternal Grandmother        brain tumor on pitutary gland   Brain cancer Paternal Grandmother  75       pituitary tumor, died in her 38's   Alcohol abuse Paternal Grandfather    Heart disease Paternal Grandfather    Hyperlipidemia Paternal Grandfather    Hypertension Paternal Grandfather    Stomach cancer Maternal Aunt 54   Stomach cancer Maternal Aunt 75       died in 41's   Breast cancer Maternal Cousin 65 - 23   Breast cancer Maternal Cousin 22 - 69   Colon cancer Neg Hx    Esophageal cancer Neg Hx     Hailey Wilkins is unaware of previous family history of genetic testing. Patient's maternal ancestors are of N. European descent, and paternal ancestors are of N. European descent. There is no reported Ashkenazi Jewish ancestry.     Hailey Wilkins does not report a family history of birth defects, intellectual disability, developmental delay, autism spectrum disorder, pr multiple miscarriages.   GENETIC TEST RESULTS:  Hailey Wilkins participated in the [place] [facility] population genomic screening program. 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 There were no pathogenic or likely pathogenic variants detected in other genes reported on in this study.   [facility] Population Screen is a screening test that analyzes 11 genes related to hereditary breast and ovarian cancer syndrome (HBOC), Lynch syndrome, and familial hypercholesterolemia. These include APOB, BRCA1, BRCA2,  EPCAM, LDLR, LDLRAP1, PCSK9, PMS2 (excluding exons 11-15), MLH1, MSH2, MSH6. This test reports pathogenic and likely pathogenic variants, but does not report on variants of unknown significance. The absence of any additional pathogenic variants is reassuring, but does not rule out a hereditary condition. There are other variants and genes associated with heart disease, hereditary cancer and other inherited conditions that were not included in this test. Please see full test report in labs, labeled [facility] Molecular Screen, dated 06/17/24. A section of the report is included below.     CLINICAL INFORMATION:  LDLRAP1-related familial hypercholesterolemia is caused by biallelic pathogenic variants (mutation in both copies of the LDLRAP1 gene) and is inherited in an autosomal recessive manner. Most often for people who have biallelic LDLRAP1 mutations, they have inherited one mutation from each parent.   Individuals who have a monoallelic (one mutation in one copy of the gene only) LDLRAP1 mutation are called carriers. Carriers of a single LDLRAP1 mutation typically do not have familial hypercholesterolemia, but some studies have suggested slightly increased levels of cholesterol.   Carriers of a monoallelic LDLRAP1 mutation have a 50% chance to pass on the LDLRAP1 mutation to children. Carriers also have a chance to have a child with LDLRAP1-related familial hypercholesterolemia if their reproductive partner is also a carrier for a LDLRAP1 mutation, and if both partners pass on a mutation to their child. If both parents are carriers, they have a 25% chance to have a child with two mutations at risk for FH, a 50% chance to have a child with one mutation (a carrier) and a 25% chance to have a child that did not inherit any mutations in [facility].   Familial hypercholesterolemia (FH) is a genetic condition characterized by abnormally high concentrations of low density lipoprotein cholesterol (LDL-C) in the  blood since birth. Build up of cholesterol in the body can lead to plaques, which can lead to arteries that bring blood to the heart to become narrowed or blocked. FH increase the risk for premature coronary heart disease (CHD), stroke, and death. In addition to extreme hypercholesterolemia, affected individuals may  present with vascular-related manifestations of CHD, such as angina, myocardial infarction, peripheral vascular disease, and stroke. Physical manifestations, including xanthomas (subcutaneous fat deposits), xanthelasmas (subcutaneous fat deposits around the eyes), and corneal arcus (whitish ring around the iris), may also be observed.   IMPLICATIONS FOR FAMILY MEMBERS: Individuals identified to be LDLRAP1 carriers are encouraged to share information about their results with family members. A family letter will be provided to help share this result with relatives. "Cascade screening" is a systematic process through which additional family members with LDLRAP1 variants are identified, starting with all first degree relatives (parents, siblings, and children). Since all first degree relatives of affected individuals have a 50% risk of also having the LDLRAP1 variant and if there other parent is a carrier, a 25% chance to be affected with FH, cascade screening is a high-yield and cost-effective way of identifying previously undiagnosed FH patients.   Evaluation of relatives for familial hypercholesterolemia should include minors (individuals under the age of 48). The recommendation from the National Lipid Association's Expert Panel on FH is that cholesterol screening should be considered beginning at age 29 for children with a family history of premature CHD or elevated cholesterol.  If family members have FH they require immediate initiation of treatment and management of other CHD risk factors (e.g. hypertension, diabetes, smoking) since individuals with FH have a very high lifetime risk of CHD and are  at high risk of premature CHD.   ADDITIONAL TESTING: We discussed that Hailey Wilkins had genetic testing for hereditary cancer with [facility]'s Common Hereditary Cancers panel in 2018. We discussed that update testing could be considered with the inclusion of RNA analysis. Hailey Wilkins plans to consider this option.   Additionally, we discussed the utility of Hailey Wilkins's affected relatives completing genetic testing for hereditary cancer. She plans to discuss with her mother and will notify us  if she needs assistance with scheduling.   The Tyrer-Cuzick model is one of multiple prediction models developed to estimate an individual's lifetime risk of developing breast cancer. The Tyrer-Cuzick model is endorsed by the [employer] (NCCN). This model includes many risk factors such as family history, endogenous estrogen exposure, and benign breast disease. The calculation is highly-dependent on the accuracy of clinical data provided by the patient and can change over time. The Tyrer-Cuzick model may be repeated to reflect new information in her personal or family history in the future.   Based on Hailey Wilkins's history, a Hailey Wilkins was used to estimate her risk of developing breast cancer. This estimates her lifetime risk of developing breast cancer to be approximately 12.3%. NCCN recommends consideration of breast MRI screening as an adjunct to mammography for patients at high risk (defined as 20% or greater lifetime risk). Please note that a woman's breast cancer risk changes over time. It may increase or decrease based on age and any changes to the personal and/or family medical history. The risks and recommendations listed above apply to this patient at this point in time. In the future, she may or may not be eligible for the same medical management strategies and, in some cases, other medical management strategies may become available to her. If she is interested in an updated breast  cancer risk assessment at a later date, she can contact us .    RESOURCES: Family Heart Foundation: GolfCleaners.co.uk  National Lipid Association: https://www.learnyourlipids.com/  PLAN:  Results will be shared with Hailey Wilkins primary care provider, Trudy Dorn Lunger, MD.   Genetic test results  should be shared with relatives, who can consider undergoing genetic testing for the LDLRAP1 pathogenic variant and/or monitoring of cholesterol.   Discuss option for hereditary cancer testing with family members.   Encourage Ms. Fines to discuss breast cancer surveillance with her primary care provider.   Lastly, we encouraged Ms. Wingate to remain in contact with [facility]'s genetics team so that we can continuously update the family history and inform her of any changes in our understanding of familial hypercholesterolemia and any additional genetic testing that may be of benefit for this family.   Ms. Perno questions were answered to her satisfaction today. Our contact information was provided should additional questions or concerns arise. Thank you for the referral and allowing us  to share in the care of your patient.   Burnard Ogren, MS, [facility] Licensed, Retail [email or web address] phone: [phone]  65 minutes were spent on the date of the encounter in service to the patient including preparation, face-to-face consultation, documentation and care coordination. The patient was seen alone.     I connected with  Ms. Cheetham on 07/30/2024 at 9:30 EDT by telephone and verified that I am speaking with the correct person using two identifiers.   Patient location: [place] Provider location: [place]  __________________________________________________________________ For Office Staff:  Number of people involved in session: 1 Was an Intern/ student involved with case: no

## 2024-09-27 ENCOUNTER — Encounter: Payer: Self-pay | Admitting: Gastroenterology

## 2024-10-04 MED ORDER — METRONIDAZOLE 500 MG PO TABS: 500.0000 mg | ORAL_TABLET | Freq: Three times a day (TID) | ORAL | 0 refills | Status: AC

## 2024-10-04 NOTE — Telephone Encounter (Signed)
 Spoke with pt via phone. She reports that her symptoms have improved. She is still having minimal LLQ pain, and still feels bloated. She does not feel that she needs the Flagyl  at this time. F/U appoint scheduled with Camie for 10/24/24 at 11:30 AM. Pt has documented anaphylactic allergy  to Levofloxacin however will send Rx for Flagyl  to pharmacy for pt to have in case her symptoms return.

## 2024-10-24 ENCOUNTER — Encounter: Payer: Self-pay | Admitting: Gastroenterology

## 2024-10-24 ENCOUNTER — Ambulatory Visit (INDEPENDENT_AMBULATORY_CARE_PROVIDER_SITE_OTHER): Payer: PRIVATE HEALTH INSURANCE | Admitting: Gastroenterology

## 2024-10-24 VITALS — BP 116/74 | HR 72 | Ht 62.0 in | Wt 159.0 lb

## 2024-10-24 DIAGNOSIS — Z8719 Personal history of other diseases of the digestive system: Secondary | ICD-10-CM

## 2024-10-24 DIAGNOSIS — R1032 Left lower quadrant pain: Secondary | ICD-10-CM

## 2024-10-24 DIAGNOSIS — K5792 Diverticulitis of intestine, part unspecified, without perforation or abscess without bleeding: Secondary | ICD-10-CM

## 2024-10-24 NOTE — Progress Notes (Signed)
 Hailey Wilkins [id number] 1971/10/05   Chief Complaint: Diverticulitis  Referring Provider: No ref. provider found Primary GI MD: Dr. Shila  HPI: Hailey Wilkins is a 53 y.o. female with past medical history of GERD, anemia, asthma, hypertension, migraines, questionable pericarditis 2013, prior cholecystectomy, diverticulitis who presents today for a complaint of diverticulitis.    Patient seen at [facility] 03/09/2022 for change in bowel habits, abdominal pain, diarrhea, hematochezia.  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  She was hospitalized for this and treated with IV antibiotics (Aztreonam ).  Symptoms initially resolved following hospitalization but then returned on 02/12/22. Labs done at follow-up visit 03/09/2022 included normal CBC, CMP, sed rate, lipase, TSH, negative celiac panel.  A repeat CT A/P was also unremarkable.  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  C. difficile and ova and parasite was negative.   Patient seen by cardiology 03/12/2024 for follow-up of inappropriate sinus tachycardia, symptoms appear to be well-controlled, alcohol is a trigger.  Has history of syncope 2022 felt to be in the setting of volume loss with orthostasis and no further episodes.  She will return for follow-up in 1 year.  Initially seen in office 03/27/2024 to establish GI care.  History of diverticulitis/2023 with recurrence in January of this year.  No colonoscopy since 2021.  Was asymptomatic at that time.  Schedule colonoscopy to rule out underlying malignancy.  Following colonoscopy, consider surgical referral to discuss elective sigmoid resection given 2+ episodes of diverticulitis.  Advised high-fiber diet. She was also experiencing upper GI symptoms of GERD, intermittent nausea, and epigastric pain and was scheduled for EGD in addition to  colonoscopy with consideration for PPI based on findings.  Had a diverticulitis flare in June.  She did not want to take antibiotics at that time.  Underwent colonoscopy and EGD 06/19/2024.  Found to have moderate diverticulosis in the sigmoid, descending, transverse, and descending colon, internal and external hemorrhoids, recall recommended in 10 years.  On upper endoscopy had a 3 cm hiatal hernia and otherwise unremarkable.  Patient sent a message 09/27/2024 and reported she felt she was having another episode of diverticulitis.  Felt better after about a week, did not start antibiotics.  Patient has a documented allergy  to levofloxacin.  Can use Flagyl  in future if needed.   Discussed the use of AI scribe software for clinical note transcription with the patient, who gave verbal consent to proceed.  History of Present Illness Hailey Wilkins is a 53 year old female with diverticulitis who presents with recurrent episodes of abdominal pain.  Abdominal pain - Recurrent episodes of severe abdominal pain associated with diverticulitis - Most recent flare-up began November 6th - Pain localized to the left lower abdomen with downward radiation - Duration of most recent episode was approximately two weeks, longer than prior episodes - Pain is intense and interferes with comfort - Walking provides some relief - Hot packs and Tylenol  used for symptomatic relief  Altered bowel habits - During flare-ups, stools are 'shoestring' thin and small, bean-shaped pieces - Sensation of incomplete evacuation - Difficulty passing gas during flares - Occasional nausea - No fever or chills  Dietary factors - Diet is high in fiber, low in red meat, with frequent fish and chicken intake, especially since her husband's heart attack in April - Suspects certain foods, such as Laff[employer]aff[employer] candy,  may trigger symptoms, but is unsure  Antibiotic intolerance and allergies - Allergic to levofloxacin and  amoxicillin , limiting antibiotic options - Tolerated Flagyl  in the past, though it causes unpleasant side effects described as feeling 'yucky'  Colonic findings - History of diverticulosis throughout the colon - Colonoscopy performed in July with recall recommended in 10 years    Previous GI Procedures/Imaging   Colonoscopy 06/19/2024 - Moderate diverticulosis in the sigmoid colon, in the descending colon, in the transverse colon and in the ascending colon. There was no evidence of diverticular bleeding.  - Non-bleeding external and internal hemorrhoids.  - No specimens collected. - Recall 10 years  EGD 06/19/2024 - Z- line regular, 36 cm from the incisors.  - No gross lesions in the entire esophagus.  - 3 cm hiatal hernia.  - Normal stomach.  - Normal examined duodenum.  - No specimens collected.  CT A/P w/o contrast 12/20/2023 - Colonic diverticulosis, without radiographic evidence of diverticulitis or other acute findings.   CT A/P w/ contrast 03/09/2022 - No acute abdominopelvic abnormality.  Normal appendix. Sigmoid diverticulosis.  No evidence of acute diverticulitis.   Colonoscopy 07/23/2020 (Dr. Ole Schick) - Pandiverticulosis, one 1 mm polyp removed from the ascending colon with path showing polypoid colonic mucosa with mild edema of lamina propria   Colonoscopy 2006 (Dr. Geryl) - Normal, no polyps per patient   Past Medical History:  Diagnosis Date   Allergic rhinitis    Dr. Ector, s/p allergy  testing   Anemia    Asthma    Basal cell carcinoma of anterior chest    removed   Breast cyst 2011   bx benign   Complication of anesthesia    Depression    after father died, on [place] in past   Family history of breast cancer    Family history of pancreatic cancer    Family history of stomach cancer    GERD (gastroesophageal reflux disease)    Gestational diabetes    with first child   Headache(784.0)    History of chicken pox    Hypertension     Inappropriate sinus node tachycardia    Internal hemorrhoid 2006   dx during colon w/Dr Geryl   Migraines    associated with menses in past, tx with excedrin, may last up to 1 week   Nonallergic rhinitis    Pericarditis    a. 10/2012 ETT: Ex time 9:00, No ST/T changes; b. 10/2012 Echo: EF 55-60%, no rwma, no pericardial effusion.   PONV (postoperative nausea and vomiting)    [facility] she had no n/v   Shingles 04/2011   Urticaria 11/10/2020    Past Surgical History:  Procedure Laterality Date   ANTERIOR CERVICAL DECOMP/DISCECTOMY FUSION N/A 06/12/2019   Procedure: ANTERIOR CERVICAL DECOMPRESSION/DISCECTOMY FUSION CERVICAL FOUR- CERVICAL FIVE, CERVICAL FIVE- CERVICAL SIX, CERVICAL SIX- CERVICAL SEVEN;  Surgeon: Alix Charleston, MD;  Location: [facility] OR;  Service: Neurosurgery;  Laterality: N/A;  ANTERIOR CERVICAL DECOMPRESSION/DISCECTOMY FUSION CERVICAL 4- CERVICAL 5, CERVICAL 5- CERVICAL 6, CERVICAL 6- CERVICAL7   BASAL CELL CARCINOMA EXCISION  2011   removed from chest/[facility]   BREAST BIOPSY Right 07/25/2018   stereo bx coil clip- CYSTIC PAPILLARY APOCRINE METAPLASIA   CHOLECYSTECTOMY  2010   [facility]   COLONOSCOPY     KNEE ARTHROSCOPY W/ MENISCAL REPAIR  1989   Right, Dr. Kathi   NASAL SINUS SURGERY  1999, 2005   REDUCTION MAMMAPLASTY  2017  TONSILLECTOMY  1988    Current Outpatient Medications  Medication Sig Dispense Refill   albuterol  (VENTOLIN  HFA) 108 (90 Base) MCG/ACT inhaler Inhale 2 puffs into the lungs every 6 (six) hours as needed for wheezing or shortness of breath. 18 each 2   cetirizine (ZYRTEC) 10 MG tablet Take 10 mg by mouth daily.     EPINEPHrine  0.3 mg/0.3 mL IJ SOAJ injection Inject 0.3 mg into the muscle as needed for anaphylaxis. 1 each 1   fluticasone  (FLONASE ) 50 MCG/ACT nasal spray Place 2 sprays into both nostrils daily.      Ivermectin 1 % CREA Apply topically daily.     LYSINE PO Take by mouth.      Multiple Vitamins-Minerals (COMPLETE MULTIVITAMIN/MINERAL PO) Take by mouth.     ondansetron  (ZOFRAN -ODT) 4 MG disintegrating tablet Take 4 mg by mouth every 6 (six) hours as needed.     tretinoin (RETIN-A) 0.025 % cream SMARTSIG:sparingly Topical Every Night     No current facility-administered medications for this visit.    Allergies as of 10/24/2024 - Review Complete 10/24/2024  Allergen Reaction Noted   Amoxicillin  Anaphylaxis and Hives 02/13/2012   Ceftriaxone Anaphylaxis 02/11/2016   Clindamycin Hives 04/05/2012   Codeine Hives, Nausea And Vomiting, and Rash 02/13/2012   Erythromycin Anaphylaxis and Hives 02/13/2012   Levofloxacin Anaphylaxis 02/13/2012   Lincomycin hcl Hives 02/13/2012   Shellfish allergy  Anaphylaxis 11/16/2012   Tetracyclines & related Anaphylaxis 02/13/2012   Latex Rash 11/16/2012   Sulfa  antibiotics Hives 02/13/2012   Vancomycin Rash 01/23/2017    Family History  Problem Relation Age of Onset   Hypertension Mother    Arthritis Mother    Breast cancer Mother 2       2nd primary at 3   Diabetes Father    Hypertension Father    Hyperlipidemia Father    Heart disease Father    Pancreatic cancer Father 46   Heart attack Father 29       HPB, stents   Thyroid  cancer Sister 14   Cancer Maternal Grandmother        might have had cancer, died at 57   Alcohol abuse Maternal Grandfather    Pancreatic cancer Maternal Grandfather 64       died in his 31's   Other Paternal Grandmother        brain tumor on pitutary gland   Brain cancer Paternal Grandmother 9       pituitary tumor, died in her 63's   Alcohol abuse Paternal Grandfather    Heart disease Paternal Grandfather    Hyperlipidemia Paternal Grandfather    Hypertension Paternal Grandfather    Stomach cancer Maternal Aunt 62   Stomach cancer Maternal Aunt 75       died in 49's   Breast cancer Maternal Cousin 52 - 16   Breast cancer Maternal Cousin 59 - 69   Colon cancer Neg Hx     Esophageal cancer Neg Hx     Social History   Tobacco Use   Smoking status: Never   Smokeless tobacco: Never  Vaping Use   Vaping status: Never Used  Substance Use Topics   Alcohol use: Yes    Alcohol/week: 5.0 standard drinks of alcohol    Types: 5 Glasses of wine per week    Comment: weekly   Drug use: No     Review of Systems:    Constitutional: No fever, chills, weakness or fatigue Cardiovascular: No chest pain Respiratory: No SOB  Gastrointestinal: See HPI and otherwise negative   Physical Exam:  Vital signs: BP 116/74   Pulse 72   Ht 5' 2 (1.575 m)   Wt 159 lb (72.1 kg)   LMP 01/19/2018 (Approximate)   BMI 29.08 kg/m   Wt Readings from Last 3 Encounters:  10/24/24 159 lb (72.1 kg)  06/19/24 162 lb (73.5 kg)  03/27/24 162 lb 6.4 oz (73.7 kg)    Constitutional: Pleasant, well-appearing female in NAD, alert and cooperative Head:  Normocephalic and atraumatic.  Eyes: No scleral icterus.  Respiratory: Respirations even and unlabored. Lungs clear to auscultation bilaterally.  No wheezes, crackles, or rhonchi.  Cardiovascular:  Regular rate and rhythm. No murmurs. No peripheral edema. Gastrointestinal:  Soft, nondistended, nontender. No rebound or guarding. Normal bowel sounds. No appreciable masses or hepatomegaly. Rectal:  Not performed.  Neurologic:  Alert and oriented x4;  grossly normal neurologically.  Skin:   Dry and intact without significant lesions or rashes. Psychiatric: Oriented to person, place and time. Demonstrates good judgement and reason without abnormal affect or behaviors.   RELEVANT LABS AND IMAGING: CBC    Component Value Date/Time   WBC 7.3 06/01/2021 1700   RBC 4.39 06/01/2021 1700   HGB 13.0 06/01/2021 1700   HGB 13.7 11/10/2020 1307   HGB 12.8 12/09/2014 1412   HCT 38.7 06/01/2021 1700   HCT 42.0 11/10/2020 1307   PLT 290 06/01/2021 1700   PLT 390 11/10/2020 1307   MCV 88.2 06/01/2021 1700   MCV 88 11/10/2020 1307   MCH 29.6  06/01/2021 1700   MCHC 33.6 06/01/2021 1700   RDW 13.7 06/01/2021 1700   RDW 13.1 11/10/2020 1307   LYMPHSABS 2.4 06/01/2021 1700   LYMPHSABS 1.9 11/10/2020 1307   MONOABS 0.6 06/01/2021 1700   EOSABS 0.2 06/01/2021 1700   EOSABS 0.2 11/10/2020 1307   BASOSABS 0.0 06/01/2021 1700   BASOSABS 0.0 11/10/2020 1307    CMP     Component Value Date/Time   NA 139 06/01/2021 1700   NA 142 11/10/2020 1307   NA 143 11/09/2012 1703   K 3.9 06/01/2021 1700   K 4.0 11/09/2012 1703   CL 105 06/01/2021 1700   CL 106 11/09/2012 1703   CO2 29 06/01/2021 1700   CO2 27 11/09/2012 1703   GLUCOSE 98 06/01/2021 1700   GLUCOSE 106 (H) 11/09/2012 1703   BUN 13 06/01/2021 1700   BUN 20 11/10/2020 1307   BUN 13 11/09/2012 1703   CREATININE 0.83 06/01/2021 1700   CREATININE 0.86 12/09/2014 1509   CALCIUM 9.3 06/01/2021 1700   CALCIUM 9.0 11/09/2012 1703   PROT 7.6 06/01/2021 1700   PROT 7.7 11/10/2020 1307   ALBUMIN 4.3 06/01/2021 1700   ALBUMIN 4.7 11/10/2020 1307   AST 22 06/01/2021 1700   ALT 23 06/01/2021 1700   ALKPHOS 69 06/01/2021 1700   BILITOT 0.5 06/01/2021 1700   BILITOT <0.2 11/10/2020 1307   GFRNONAA >60 06/01/2021 1700   GFRNONAA >60 11/09/2012 1703   GFRAA 101 11/10/2020 1307   GFRAA >60 11/09/2012 1703     Assessment/Plan:   Assessment & Plan Recurrent diverticulitis LLQ abdominal pain Patient with recurrent diverticulitis, has had multiple episodes this year which were not complicated, though did have significant pain in LLQ. Limited antibiotic options due to allergies, though she has been able to manage without antibiotics the last couple of episodes.  Was having some suprapubic pain during last episode, which lasted a couple weeks, and has appointment  with OB/GYN to evaluate for any overlap with urogynecologic issues.  Currently asymptomatic. As she has had multiple episodes of diverticulitis with no concerning findings on recent colonoscopy, offered referral to surgery  to discuss potential surgical management.  She is not particularly interested in having surgery, but is open to having the discussion and establishing care as she continues to have recurrent episodes.  - Refer to surgery to discuss potential surgical management of recurrent diverticulitis - Has prescription for course of Flagyl  at home which she can use as needed if symptoms return - If symptoms return, consider getting a repeat CT scan - Advised high-fiber diet when not in a flare - Follow-up 6 months   Camie Furbish, PA-C [facility] 10/24/2024, 11:34 AM  Patient Care Team: Trudy Dorn Lunger, MD (Inactive) as PCP - General (Internal Medicine) Darron Deatrice LABOR, MD as PCP - Cardiology (Cardiology) Cleotilde Ronal RAMAN, MD as Attending Physician (Gynecology)

## 2024-10-24 NOTE — Patient Instructions (Addendum)
 We are referring you to [facility] ([facility]  They will contact you directly to schedule an appointment.  It may take a week or more before you hear from them.  Please feel free to contact us  if you have not heard from them within 2 weeks and we will follow up on the referral.   Thank you for trusting me with your gastrointestinal care!   Camie Furbish, PA-C  _______________________________________________________  If your blood pressure at your visit was 140/90 or greater, please contact your primary care physician to follow up on this.  _______________________________________________________  If you are age 59 or older, your body mass index should be between 23-30. Your Body mass index is 29.08 kg/m. If this is out of the aforementioned range listed, please consider follow up with your Primary Care Provider.  If you are age 19 or younger, your body mass index should be between 19-25. Your Body mass index is 29.08 kg/m. If this is out of the aformentioned range listed, please consider follow up with your Primary Care Provider.   ________________________________________________________  The [facility] providers would like to encourage you to use MYCHART to communicate with providers for non-urgent requests or questions.  Due to long hold times on the telephone, sending your provider a message by [facility] may be a faster and more efficient way to get a response.  Please allow 48 business hours for a response.  Please remember that this is for non-urgent requests.  _______________________________________________________  [facility] is using a team-based approach to care.  Your team is made up of your doctor and two to three APPS. Our APPS (Nurse Practitioners and Physician Assistants) work with your physician to ensure care continuity for you. They are fully qualified to address your health concerns and develop a treatment plan. They communicate directly with your  gastroenterologist to care for you. Seeing the Advanced Practice Practitioners on your physician's team can help you by facilitating care more promptly, often allowing for earlier appointments, access to diagnostic testing, procedures, and other specialty referrals.

## 2024-11-19 NOTE — Progress Notes (Unsigned)
" ° °  ANNUAL EXAM Patient name: Hailey Wilkins MRN [id number]  Date of birth: 1971/07/17 Chief Complaint:   No chief complaint on file.  History of Present Illness:   Hailey Wilkins is a 53 y.o. G2P2 Caucasian female being seen today for a routine annual exam.  Current complaints: ***  Patient's last menstrual period was 01/19/2018.   The pregnancy intention screening data noted above was reviewed. Potential methods of contraception were discussed. The patient elected to proceed with No data recorded.   Last pap 01/14/2021. Results were: NILM w/ HRHPV negative. H/O abnormal pap: {yes/yes***/no:23866} Last mammogram: 11/23/2023. Results were: normal. Family h/o breast cancer: {yes***/no:23838} Last colonoscopy: 06/19/2024. Results were: normal. Family h/o colorectal cancer: {yes***/no:23838}     01/19/2022    9:59 AM 01/14/2021    4:05 PM 10/22/2012    3:40 PM  Depression screen PHQ 2/9  Decreased Interest 0 0 0  Down, Depressed, Hopeless 0 0 0  PHQ - 2 Score 0 0 0      Data saved with a previous flowsheet row definition         No data to display           Review of Systems:   Pertinent items are noted in HPI Denies any headaches, blurred vision, fatigue, shortness of breath, chest pain, abdominal pain, abnormal vaginal discharge/itching/odor/irritation, problems with periods, bowel movements, urination, or intercourse unless otherwise stated above. Pertinent History Reviewed:  Reviewed past medical,surgical, social and family history.  Reviewed problem list, medications and allergies. Physical Assessment:  There were no vitals filed for this visit.There is no height or weight on file to calculate BMI.        Physical Examination:   General appearance - well appearing, and in no distress  Mental status - alert, oriented to person, place, and time  Psych:  She has a normal mood and affect  Skin - warm and dry, normal color, no suspicious lesions noted  Chest -  effort normal, all lung fields clear to auscultation bilaterally  Heart - normal rate and regular rhythm  Neck:  midline trachea, no thyromegaly or nodules  Breasts - breasts appear normal, no suspicious masses, no skin or nipple changes or  axillary nodes  Abdomen - soft, nontender, nondistended, no masses or organomegaly  Pelvic - VULVA: normal appearing vulva with no masses, tenderness or lesions  VAGINA: normal appearing vagina with normal color and discharge, no lesions  CERVIX: normal appearing cervix without discharge or lesions, no CMT  Thin prep pap is {Desc; done/not:10129} *** HR HPV cotesting  UTERUS: uterus is felt to be normal size, shape, consistency and nontender   ADNEXA: No adnexal masses or tenderness noted.  Rectal - normal rectal, good sphincter tone, no masses felt. Hemoccult: ***  Extremities:  No swelling or varicosities noted  Chaperone present for exam  No results found for this or any previous visit (from the past 24 hours).  Assessment & Plan:  1) Well-Woman Exam  2) ***  Labs/procedures today: ***  Mammogram: {Mammo f/u:25212::@ 54yo}, or sooner if problems Colonoscopy: {TCS f/u:25213::@ 53yo}, or sooner if problems  No orders of the defined types were placed in this encounter.   Meds: No orders of the defined types were placed in this encounter.   Follow-up: No follow-ups on file.  Morna LOISE Quale, RN 11/19/2024 9:14 AM "

## 2024-11-20 ENCOUNTER — Ambulatory Visit (HOSPITAL_BASED_OUTPATIENT_CLINIC_OR_DEPARTMENT_OTHER): Admitting: Obstetrics & Gynecology

## 2024-11-20 ENCOUNTER — Other Ambulatory Visit (HOSPITAL_COMMUNITY)
Admission: RE | Admit: 2024-11-20 | Discharge: 2024-11-20 | Disposition: A | Discharge status: Home / Self Care | Source: Ambulatory Visit | Attending: Obstetrics & Gynecology | Admitting: Obstetrics & Gynecology

## 2024-11-20 ENCOUNTER — Encounter (HOSPITAL_BASED_OUTPATIENT_CLINIC_OR_DEPARTMENT_OTHER): Payer: Self-pay | Admitting: Obstetrics & Gynecology

## 2024-11-20 VITALS — BP 135/76 | HR 97 | Ht 62.0 in | Wt 156.6 lb

## 2024-11-20 DIAGNOSIS — R102 Pelvic and perineal pain unspecified side: Secondary | ICD-10-CM | POA: Diagnosis not present

## 2024-11-20 DIAGNOSIS — Z124 Encounter for screening for malignant neoplasm of cervix: Secondary | ICD-10-CM

## 2024-11-20 DIAGNOSIS — Z01419 Encounter for gynecological examination (general) (routine) without abnormal findings: Secondary | ICD-10-CM

## 2024-11-20 DIAGNOSIS — N898 Other specified noninflammatory disorders of vagina: Secondary | ICD-10-CM

## 2024-11-20 DIAGNOSIS — Z780 Asymptomatic menopausal state: Secondary | ICD-10-CM

## 2024-11-20 DIAGNOSIS — Z1331 Encounter for screening for depression: Secondary | ICD-10-CM

## 2024-11-20 MED ORDER — ESTRADIOL 10 MCG VA TABS: 1.0000 | ORAL_TABLET | VAGINAL | 4 refills | Status: AC

## 2024-11-25 ENCOUNTER — Ambulatory Visit (HOSPITAL_BASED_OUTPATIENT_CLINIC_OR_DEPARTMENT_OTHER): Payer: Self-pay | Admitting: Obstetrics & Gynecology

## 2024-11-25 LAB — CYTOLOGY - PAP
Comment: NEGATIVE
Diagnosis: NEGATIVE
High risk HPV: NEGATIVE

## 2024-12-19 ENCOUNTER — Other Ambulatory Visit: Payer: Self-pay | Admitting: Obstetrics & Gynecology

## 2024-12-19 DIAGNOSIS — Z1231 Encounter for screening mammogram for malignant neoplasm of breast: Secondary | ICD-10-CM

## 2025-01-15 ENCOUNTER — Other Ambulatory Visit (HOSPITAL_BASED_OUTPATIENT_CLINIC_OR_DEPARTMENT_OTHER): Payer: Self-pay | Admitting: Obstetrics & Gynecology

## 2025-01-15 ENCOUNTER — Other Ambulatory Visit (HOSPITAL_BASED_OUTPATIENT_CLINIC_OR_DEPARTMENT_OTHER)
